# Patient Record
Sex: Female | Born: 1940 | Race: Black or African American | Hispanic: No | Smoking: Never smoker
Health system: Southern US, Community
[De-identification: ages and names within clinical notes are randomized; demographics above are authoritative.]

## PROBLEM LIST (undated history)

## (undated) DIAGNOSIS — M1990 Unspecified osteoarthritis, unspecified site: Secondary | ICD-10-CM

## (undated) DIAGNOSIS — G5600 Carpal tunnel syndrome, unspecified upper limb: Secondary | ICD-10-CM

## (undated) DIAGNOSIS — M069 Rheumatoid arthritis, unspecified: Secondary | ICD-10-CM

## (undated) DIAGNOSIS — K219 Gastro-esophageal reflux disease without esophagitis: Secondary | ICD-10-CM

## (undated) DIAGNOSIS — M62830 Muscle spasm of back: Secondary | ICD-10-CM

## (undated) DIAGNOSIS — I10 Essential (primary) hypertension: Secondary | ICD-10-CM

## (undated) HISTORY — DX: Muscle spasm of back: M62.830

## (undated) HISTORY — DX: Gastro-esophageal reflux disease without esophagitis: K21.9

## (undated) HISTORY — PX: TUBAL LIGATION: SHX77

## (undated) HISTORY — DX: Rheumatoid arthritis, unspecified: M06.9

## (undated) HISTORY — PX: KNEE SURGERY: SHX244

## (undated) HISTORY — DX: Essential (primary) hypertension: I10

---

## 1998-12-18 ENCOUNTER — Emergency Department (HOSPITAL_COMMUNITY): Admission: EM | Admit: 1998-12-18 | Discharge: 1998-12-18 | Payer: Self-pay | Admitting: Emergency Medicine

## 2001-06-25 ENCOUNTER — Encounter (HOSPITAL_COMMUNITY): Admission: RE | Admit: 2001-06-25 | Discharge: 2001-07-25 | Payer: Self-pay | Admitting: Orthopedic Surgery

## 2002-07-17 ENCOUNTER — Encounter: Payer: Self-pay | Admitting: Internal Medicine

## 2002-07-17 ENCOUNTER — Emergency Department (HOSPITAL_COMMUNITY): Admission: EM | Admit: 2002-07-17 | Discharge: 2002-07-17 | Payer: Self-pay | Admitting: Internal Medicine

## 2002-09-01 ENCOUNTER — Ambulatory Visit (HOSPITAL_COMMUNITY): Admission: RE | Admit: 2002-09-01 | Discharge: 2002-09-01 | Payer: Self-pay | Admitting: Ophthalmology

## 2003-11-10 ENCOUNTER — Ambulatory Visit (HOSPITAL_COMMUNITY): Admission: RE | Admit: 2003-11-10 | Discharge: 2003-11-10 | Payer: Self-pay | Admitting: Family Medicine

## 2004-01-14 ENCOUNTER — Emergency Department (HOSPITAL_COMMUNITY): Admission: EM | Admit: 2004-01-14 | Discharge: 2004-01-14 | Payer: Self-pay | Admitting: Emergency Medicine

## 2005-03-19 ENCOUNTER — Ambulatory Visit (HOSPITAL_COMMUNITY): Admission: RE | Admit: 2005-03-19 | Discharge: 2005-03-19 | Payer: Self-pay | Admitting: Family Medicine

## 2006-03-31 ENCOUNTER — Emergency Department (HOSPITAL_COMMUNITY): Admission: EM | Admit: 2006-03-31 | Discharge: 2006-03-31 | Payer: Self-pay | Admitting: Emergency Medicine

## 2006-09-20 ENCOUNTER — Ambulatory Visit (HOSPITAL_COMMUNITY): Admission: RE | Admit: 2006-09-20 | Discharge: 2006-09-20 | Payer: Self-pay | Admitting: Family Medicine

## 2006-11-01 ENCOUNTER — Emergency Department (HOSPITAL_COMMUNITY): Admission: EM | Admit: 2006-11-01 | Discharge: 2006-11-01 | Payer: Self-pay | Admitting: Emergency Medicine

## 2006-11-04 ENCOUNTER — Ambulatory Visit (HOSPITAL_COMMUNITY): Admission: RE | Admit: 2006-11-04 | Discharge: 2006-11-04 | Payer: Self-pay | Admitting: Emergency Medicine

## 2006-12-11 ENCOUNTER — Ambulatory Visit: Payer: Self-pay | Admitting: Gastroenterology

## 2006-12-11 ENCOUNTER — Ambulatory Visit (HOSPITAL_COMMUNITY): Admission: RE | Admit: 2006-12-11 | Discharge: 2006-12-11 | Payer: Self-pay | Admitting: Gastroenterology

## 2007-09-09 ENCOUNTER — Ambulatory Visit (HOSPITAL_COMMUNITY): Admission: RE | Admit: 2007-09-09 | Discharge: 2007-09-09 | Payer: Self-pay | Admitting: Family Medicine

## 2007-09-11 ENCOUNTER — Emergency Department (HOSPITAL_COMMUNITY): Admission: EM | Admit: 2007-09-11 | Discharge: 2007-09-11 | Payer: Self-pay | Admitting: Emergency Medicine

## 2009-01-21 ENCOUNTER — Ambulatory Visit (HOSPITAL_COMMUNITY): Admission: RE | Admit: 2009-01-21 | Discharge: 2009-01-21 | Payer: Self-pay | Admitting: Family Medicine

## 2009-06-06 ENCOUNTER — Encounter: Payer: Self-pay | Admitting: Orthopedic Surgery

## 2009-06-06 ENCOUNTER — Ambulatory Visit (HOSPITAL_COMMUNITY): Admission: RE | Admit: 2009-06-06 | Discharge: 2009-06-06 | Payer: Self-pay | Admitting: Family Medicine

## 2010-01-09 ENCOUNTER — Ambulatory Visit: Payer: Self-pay | Admitting: Orthopedic Surgery

## 2010-01-09 DIAGNOSIS — M545 Low back pain, unspecified: Secondary | ICD-10-CM | POA: Insufficient documentation

## 2010-01-09 DIAGNOSIS — Q762 Congenital spondylolisthesis: Secondary | ICD-10-CM

## 2010-01-09 DIAGNOSIS — G8929 Other chronic pain: Secondary | ICD-10-CM | POA: Insufficient documentation

## 2010-01-09 DIAGNOSIS — M549 Dorsalgia, unspecified: Secondary | ICD-10-CM | POA: Insufficient documentation

## 2010-01-11 ENCOUNTER — Encounter (HOSPITAL_COMMUNITY): Admission: RE | Admit: 2010-01-11 | Discharge: 2010-02-10 | Payer: Self-pay | Admitting: Orthopedic Surgery

## 2010-01-11 ENCOUNTER — Encounter: Payer: Self-pay | Admitting: Orthopedic Surgery

## 2010-01-13 ENCOUNTER — Telehealth: Payer: Self-pay | Admitting: Orthopedic Surgery

## 2010-05-03 ENCOUNTER — Encounter: Payer: Self-pay | Admitting: Orthopedic Surgery

## 2010-08-07 ENCOUNTER — Ambulatory Visit (HOSPITAL_COMMUNITY): Admission: RE | Admit: 2010-08-07 | Discharge: 2010-08-07 | Payer: Self-pay | Admitting: Family Medicine

## 2011-01-21 ENCOUNTER — Encounter: Payer: Self-pay | Admitting: Family Medicine

## 2011-01-30 NOTE — Miscellaneous (Signed)
Summary: Discharge from Rehab  Discharge from Rehab   Imported By: Jacklynn Ganong 05/08/2010 09:57:53  _____________________________________________________________________  External Attachment:    Type:   Image     Comment:   External Document

## 2011-01-30 NOTE — Progress Notes (Signed)
Summary: call from patient request for medication and for notes to PCP  Phone Note Call from Patient   Caller: Patient Summary of Call: Patient called, had been seen 01/09/10 in office;  ask if any med can be prescribed for back other than Ibuprofen.  I advised Dr Romeo Apple does not prescribe for dx chronic back pain.  Ased if we can fwd notes to her primary care doctor; done. Initial call taken by: Cammie Sickle,  January 13, 2010 12:18 PM

## 2011-01-30 NOTE — Letter (Signed)
Summary: History form  History form   Imported By: Jacklynn Ganong 01/16/2010 11:43:05  _____________________________________________________________________  External Attachment:    Type:   Image     Comment:   External Document

## 2011-01-30 NOTE — Assessment & Plan Note (Signed)
Summary: Back pain/needs xray/medicaid,medicare,evercare/frs   Vital Signs:  [name] profile:   70 year old female Pulse rate:   78 / minute Resp:     16 per minute  Vitals Entered By: Fuller Canada MD (January 09, 2010 9:55 AM)  Visit Type:  new   CC:  back pain .  History of Present Illness: I saw Kim [name] in the office today for an initial visit.  She is a 70 years old woman with the complaint of:  chief complaint:  Back pain. This is a 69 year old female presents with pain in her back for several months. He complains of moderate pain in the lower back, which is worse when she gets up in the morning improved somewhat with aspirin. She describes it as achy.   She denies any stiffness, catching, locking, leg, weakness or numbness.   she denies any previous treatment, including any injections or physical therapy.  No problems with urination, has to take something to make her bowels move.  -xrays done & where:  [facility] 06/06/09 L [facility].  Meds: aspirin.  Allergies (verified): No Known Drug Allergies  Past History:  Past Medical History: CTS bilateral hands arthritis  Past Surgical History: foot rt no bunion or fracture   hand left cts   knee right arthroscopy   eye left for cataracts  tubes tied  Family History: FH of Cancer:  Family History of Diabetes Family History of Arthritis  Social History: [name] is divorced.  unemployed no smoking no alcohol no caffeine.  Review of Systems General:  Complains of weight gain, fever, and chills; denies weight loss and fatigue. Cardiac :  Denies chest pain, angina, heart attack, heart failure, poor circulation, blood clots, and phlebitis. Resp:  Complains of short of breath and cough; denies difficulty breathing, COPD, and pneumonia. GI:  Complains of diarrhea; denies nausea, vomiting, constipation, difficulty swallowing, ulcers, GERD, and reflux. GU:  Denies kidney failure, kidney transplant, kidney stones,  burning, poor stream, testicular cancer, blood in urine, and . Neuro:  Complains of headache and dizziness; denies migraines, numbness, weakness, tremor, and unsteady walking. MS:  Complains of gout; denies joint pain, rheumatoid arthritis, joint swelling, bone cancer, osteoporosis, and . Endo:  Denies thyroid disease, goiter, and diabetes. Psych:  Denies depression, mood swings, anxiety, panic attack, bipolar, and schizophrenia. Derm:  Denies eczema, cancer, and itching. EENT:  Complains of cataracts, poor hearing, ears ringing, and toothaches; denies poor vision, glaucoma, vertigo, sinusitis, hoarseness, and bleeding gums. Immunology:  Denies seasonal allergies, sinus problems, and allergic to bee stings. Lymphatic:  Denies lymph node cancer and lymph edema.  Physical Exam  Msk:  The [name] is well developed and nourished, with normal grooming and hygiene. 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 Gluteal regions are nontender. Straight leg raise is negative.  Bilateral hip examination no pain with flexion of the hip 120. Knee flexion, 115 bilateral. Ankle motion is limited on the RIGHT and approximately 25 on the LEFT.  Muscle tone is normal.  Joint stability is normal.  Alignment valgus knees, RIGHT worse and LEFT.  Thumb deformities are noted.  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  She has back pain with spondylolisthesis and no radicular symptoms, recommended physical therapy. Follow up as needed.  Orders: New [name] Level III (41324)  Other Orders: Physical Therapy Referral (PT)  [name] Instructions: 1)  Most patients (90%) of patients with low back pain will improve with time (2-6 weeks). Limit activity to comfort and avoid activities that increase discomfort.  Apply moist heat and/or ice to lower back and take medication as instructed for pain relief. Please read the Back Pain Handout and start Physical Therapy as directed.  2)  Physical therapy  3)  f/u as needed

## 2011-01-30 NOTE — Miscellaneous (Signed)
Summary: PT cl;inical evaluation  PT cl;inical evaluation   Imported By: Jacklynn Ganong 01/30/2010 11:19:17  _____________________________________________________________________  External Attachment:    Type:   Image     Comment:   External Document

## 2011-05-18 NOTE — Op Note (Signed)
NAMETEIGHLOR, KORSON              ACCOUNT NO.:  [id number]   MEDICAL RECORD NO.:  [id number]          PATIENT TYPE:  AMB   LOCATION:  DAY                           FACILITY:  [facility]   PHYSICIAN:  Kassie Mends, M.D.      DATE OF BIRTH:  06-14-1941   DATE OF PROCEDURE:  12/11/2006  DATE OF DISCHARGE:                               OPERATIVE REPORT   PROCEDURE:  Colonoscopy.   ENDOSCOPIST:  Kassie Mends, M.D.   INDICATIONS FOR PROCEDURE:  Ms. Winfree is a 70 year old female who  presents for average risk colon cancer screening.   FINDINGS:  1. Normal colon, without evidence of polyps, masses, diverticula,      inflammatory changes, or vascular ectasias.  2. Normal retroflexed view of the rectum.   RECOMMENDATIONS:  1. Resume previous diet.  2. Screening colonoscopy in 10 years.  3. Follow up with Dr. Ishmael Holter. [place].   MEDICATIONS:  1. Demerol 75 mg IV.  2. Versed 3 mg IV.   DESCRIPTION OF PROCEDURE:  Informed consent was obtained from the  patient and a physical exam was performed.  The benefits, risks and  alternatives were explained to the patient.  The patient was connected  to the monitor and placed in the left lateral position.  Continuous  oxygen was provided by nasal cannula and IV medicine administered  through an indwelling cannula.  After administration of sedation and  rectal exam, the patient's rectum  was intubated and the scope was advanced under direct visualization to  the cecum.  The scope was subsequently removed slowly, by carefully  examining the texture, color, anatomy and integrity of the mucosa on the  way out.  The patient was recovered in the endoscopy suite and  discharged home in satisfactory condition.      Kassie Mends, M.D.  Electronically Signed     SM/MEDQ  D:  12/11/2006  T:  12/11/2006  Job:  [id number]   cc:   Angus G. Renard Matter, MD  Fax: [phone]

## 2011-10-12 LAB — B-NATRIURETIC PEPTIDE (CONVERTED LAB): Pro B Natriuretic peptide (BNP): <30

## 2011-10-12 LAB — DIFFERENTIAL
Basophils Absolute: 0
Basophils Relative: 1
Eosinophils Absolute: 0.2
Eosinophils Relative: 3
Lymphocytes Relative: 28
Lymphs Abs: 1.9
Monocytes Absolute: 0.4
Monocytes Relative: 5
Neutro Abs: 4.2
Neutrophils Relative %: 63

## 2011-10-12 LAB — COMPREHENSIVE METABOLIC PANEL
ALT: 12
AST: 25
Albumin: 3.3 — ABNORMAL LOW
Alkaline Phosphatase: 89
BUN: 9
CO2: 25
Calcium: 8.8
Chloride: 109
Creatinine, Ser: 0.72
GFR calc Af Amer: >60
GFR calc non Af Amer: >60
Glucose, Bld: 134 — ABNORMAL HIGH
Potassium: 3.2 — ABNORMAL LOW
Sodium: 140
Total Bilirubin: 0.5
Total Protein: 7.4

## 2011-10-12 LAB — POCT CARDIAC MARKERS
CKMB, poc: 5
CKMB, poc: 6.6
Myoglobin, poc: 102
Myoglobin, poc: 87.7
Operator id: 209231
Operator id: 282261
Troponin i, poc: <0.05
Troponin i, poc: <0.05

## 2011-10-12 LAB — LIPASE, BLOOD: Lipase: 21

## 2011-10-12 LAB — CBC
HCT: 41.8
Hemoglobin: 13.8
MCHC: 32.9
MCV: 86.6
Platelets: 263
RBC: 4.83
RDW: 13.9
WBC: 6.8

## 2012-02-29 ENCOUNTER — Emergency Department (HOSPITAL_COMMUNITY)
Admission: EM | Admit: 2012-02-29 | Discharge: 2012-02-29 | Disposition: A | Discharge status: Home / Self Care | Payer: PRIVATE HEALTH INSURANCE | Attending: Emergency Medicine | Admitting: Emergency Medicine

## 2012-02-29 ENCOUNTER — Encounter (HOSPITAL_COMMUNITY): Payer: Self-pay | Admitting: *Deleted

## 2012-02-29 DIAGNOSIS — R630 Anorexia: Secondary | ICD-10-CM | POA: Insufficient documentation

## 2012-02-29 DIAGNOSIS — M129 Arthropathy, unspecified: Secondary | ICD-10-CM | POA: Insufficient documentation

## 2012-02-29 DIAGNOSIS — R1110 Vomiting, unspecified: Secondary | ICD-10-CM | POA: Insufficient documentation

## 2012-02-29 DIAGNOSIS — R197 Diarrhea, unspecified: Secondary | ICD-10-CM | POA: Insufficient documentation

## 2012-02-29 DIAGNOSIS — R109 Unspecified abdominal pain: Secondary | ICD-10-CM | POA: Insufficient documentation

## 2012-02-29 HISTORY — DX: Unspecified osteoarthritis, unspecified site: M19.90

## 2012-02-29 LAB — DIFFERENTIAL
Basophils Absolute: 0 10*3/uL (ref 0.0–0.1)
Basophils Relative: 0 % (ref 0–1)
Eosinophils Absolute: 0.1 10*3/uL (ref 0.0–0.7)
Eosinophils Relative: 2 % (ref 0–5)
Lymphocytes Relative: 16 % (ref 12–46)
Lymphs Abs: 1.3 10*3/uL (ref 0.7–4.0)
Monocytes Absolute: 0.5 10*3/uL (ref 0.1–1.0)
Monocytes Relative: 6 % (ref 3–12)
Neutro Abs: 6.2 10*3/uL (ref 1.7–7.7)
Neutrophils Relative %: 76 % (ref 43–77)

## 2012-02-29 LAB — URINALYSIS, ROUTINE W REFLEX MICROSCOPIC
Bilirubin Urine: NEGATIVE
Glucose, UA: NEGATIVE mg/dL
Ketones, ur: NEGATIVE mg/dL
Nitrite: NEGATIVE
Specific Gravity, Urine: 1.025 (ref 1.005–1.030)
pH: 6 (ref 5.0–8.0)

## 2012-02-29 LAB — COMPREHENSIVE METABOLIC PANEL
ALT: 7 U/L (ref 0–35)
AST: 18 U/L (ref 0–37)
Albumin: 3.3 g/dL — ABNORMAL LOW (ref 3.5–5.2)
Alkaline Phosphatase: 95 U/L (ref 39–117)
BUN: 18 mg/dL (ref 6–23)
CO2: 26 mEq/L (ref 19–32)
Calcium: 8.6 mg/dL (ref 8.4–10.5)
Chloride: 106 mEq/L (ref 96–112)
Creatinine, Ser: 0.63 mg/dL (ref 0.50–1.10)
GFR calc Af Amer: >90 mL/min (ref >90)
GFR calc non Af Amer: 89 mL/min — ABNORMAL LOW (ref >90)
Glucose, Bld: 98 mg/dL (ref 70–99)
Potassium: 3.5 mEq/L (ref 3.5–5.1)
Sodium: 140 mEq/L (ref 135–145)
Total Bilirubin: 0.4 mg/dL (ref 0.3–1.2)
Total Protein: 7.8 g/dL (ref 6.0–8.3)

## 2012-02-29 LAB — CBC
HCT: 46.3 % — ABNORMAL HIGH (ref 36.0–46.0)
Hemoglobin: 15.1 g/dL — ABNORMAL HIGH (ref 12.0–15.0)
MCH: 29 pg (ref 26.0–34.0)
MCHC: 32.6 g/dL (ref 30.0–36.0)
MCV: 89 fL (ref 78.0–100.0)
Platelets: 258 10*3/uL (ref 150–400)
RBC: 5.2 MIL/uL — ABNORMAL HIGH (ref 3.87–5.11)
RDW: 13.5 % (ref 11.5–15.5)
WBC: 8.2 10*3/uL (ref 4.0–10.5)

## 2012-02-29 LAB — URINE MICROSCOPIC-ADD ON

## 2012-02-29 MED ORDER — ONDANSETRON 8 MG PO TBDP: 8.0000 mg | ORAL_TABLET | Freq: Three times a day (TID) | ORAL | Status: AC | PRN

## 2012-02-29 MED ORDER — ONDANSETRON HCL 4 MG/2ML IJ SOLN
4.0000 mg | Freq: Once | INTRAMUSCULAR | Status: AC
  Administered 2012-02-29: 4 mg via INTRAVENOUS
  Filled 2012-02-29: qty 2

## 2012-02-29 MED ORDER — LOPERAMIDE HCL 2 MG PO CAPS: 2.0000 mg | ORAL_CAPSULE | Freq: Four times a day (QID) | ORAL | Status: AC | PRN

## 2012-02-29 MED ORDER — SODIUM CHLORIDE 0.9 % IV BOLUS (SEPSIS)
500.0000 mL | Freq: Once | INTRAVENOUS | Status: AC
  Administered 2012-02-29: 12:00:00 via INTRAVENOUS

## 2012-02-29 MED ORDER — SODIUM CHLORIDE 0.9 % IV SOLN: INTRAVENOUS | Status: DC

## 2012-02-29 MED ORDER — CEFUROXIME AXETIL 250 MG PO TABS: 250.0000 mg | ORAL_TABLET | Freq: Two times a day (BID) | ORAL | Status: AC

## 2012-02-29 NOTE — ED Notes (Signed)
Abd pain, NVD since last night,  Other family members having similar sx.

## 2012-02-29 NOTE — ED Notes (Signed)
Pt began having ab pain around 3am today, +nausea, no vomiting or diarrhea, no fever. Normal po intake prior.

## 2012-02-29 NOTE — ED Provider Notes (Signed)
History   This chart was scribed for Celene Kras, MD by Sofie Rower. The patient was seen in room APA14/APA14 and the patient's care was started at 11:39AM.    CSN: [id number]  Arrival date & time 02/29/12  1104   First MD Initiated Contact with Patient 02/29/12 1134      Chief Complaint  Patient presents with  . Abdominal Pain    (Consider location/radiation/quality/duration/timing/severity/associated sxs/prior treatment) HPI  Kim [name] is a 71 y.o. female who presents to the Emergency Department complaining of moderate, constant abdominal pain onset today with associated symptoms of vomiting (2X), diarrhea (more than 10X). Pt states "she has been able to keep food down this morning. The last vomiting incident occurred last night. Modifying factors include taking "anti-diarrheal pills" with no relief. Pt has had sick contacts.  Pt denies any restaurant eating (eating out), fever, cough.  PCP is Dr. Renard Matter.   Past Medical History  Diagnosis Date  . Arthritis     Past Surgical History  Procedure Date  . Tubal ligation   . Knee surgery     Family History  Problem Relation Age of Onset  . Heart failure Mother   . Stroke Mother   . Heart failure Father   . Stroke Father     History  Substance Use Topics  . Smoking status: Never Smoker   . Smokeless tobacco: Not on file  . Alcohol Use: No    OB History    Grav Para Term Preterm Abortions TAB SAB Ect Mult Living                  Review of Systems  All other systems reviewed and are negative.    10 Systems reviewed and are negative for acute change except as noted in the HPI.   Allergies  Review of patient's allergies indicates no known allergies.  Home Medications  No current outpatient prescriptions on file.  BP 156/96  Pulse 104  Temp(Src) 99.2 F (37.3 C) (Oral)  Resp 16  Ht 5\' 2"  (1.575 m)  Wt 197 lb (89.359 kg)  BMI 36.03 kg/m2  SpO2 100%  Physical Exam  Nursing note and vitals  reviewed. Constitutional: She appears well-developed and well-nourished. No distress.  HENT:  Head: Normocephalic and atraumatic.  Right Ear: External ear normal.  Left Ear: External ear normal.       Mucus membranes dry.  Eyes: Conjunctivae are normal. Right eye exhibits no discharge. Left eye exhibits no discharge. No scleral icterus.  Neck: Neck supple. No tracheal deviation present.  Cardiovascular: Normal rate, regular rhythm and intact distal pulses.   Pulmonary/Chest: Effort normal and breath sounds normal. No stridor. No respiratory distress. She has no wheezes. She has no rales.  Abdominal: Soft. Bowel sounds are normal. She exhibits no distension and no mass. There is no tenderness. There is no rebound and no guarding.  Musculoskeletal: She exhibits no edema and no tenderness.  Neurological: She is alert. She has normal strength. No sensory deficit. Cranial nerve deficit:  no gross defecits noted. She exhibits normal muscle tone. She displays no seizure activity. Coordination normal.  Skin: Skin is warm and dry. No rash noted.  Psychiatric: She has a normal mood and affect.    ED Course  Procedures (including critical care time)  DIAGNOSTIC STUDIES: Oxygen Saturation is 100% on room air, normal by my interpretation.    COORDINATION OF CARE:   Results for orders placed during the hospital encounter of  02/29/12  CBC      Component Value Range   WBC 8.2  4.0 - 10.5 (K/uL)   RBC 5.20 (*) 3.87 - 5.11 (MIL/uL)   Hemoglobin 15.1 (*) 12.0 - 15.0 (g/dL)   HCT 16.1 (*) 09.6 - 46.0 (%)   MCV 89.0  78.0 - 100.0 (fL)   MCH 29.0  26.0 - 34.0 (pg)   MCHC 32.6  30.0 - 36.0 (g/dL)   RDW 04.5  40.9 - 81.1 (%)   Platelets 258  150 - 400 (K/uL)  DIFFERENTIAL      Component Value Range   Neutrophils Relative 76  43 - 77 (%)   Neutro Abs 6.2  1.7 - 7.7 (K/uL)   Lymphocytes Relative 16  12 - 46 (%)   Lymphs Abs 1.3  0.7 - 4.0 (K/uL)   Monocytes Relative 6  3 - 12 (%)   Monocytes  Absolute 0.5  0.1 - 1.0 (K/uL)   Eosinophils Relative 2  0 - 5 (%)   Eosinophils Absolute 0.1  0.0 - 0.7 (K/uL)   Basophils Relative 0  0 - 1 (%)   Basophils Absolute 0.0  0.0 - 0.1 (K/uL)  COMPREHENSIVE METABOLIC PANEL      Component Value Range   Sodium 140  135 - 145 (mEq/L)   Potassium 3.5  3.5 - 5.1 (mEq/L)   Chloride 106  96 - 112 (mEq/L)   CO2 26  19 - 32 (mEq/L)   Glucose, Bld 98  70 - 99 (mg/dL)   BUN 18  6 - 23 (mg/dL)   Creatinine, Ser 9.14  0.50 - 1.10 (mg/dL)   Calcium 8.6  8.4 - 78.2 (mg/dL)   Total Protein 7.8  6.0 - 8.3 (g/dL)   Albumin 3.3 (*) 3.5 - 5.2 (g/dL)   AST 18  0 - 37 (U/L)   ALT 7  0 - 35 (U/L)   Alkaline Phosphatase 95  39 - 117 (U/L)   Total Bilirubin 0.4  0.3 - 1.2 (mg/dL)   GFR calc non Af Amer 89 (*) >90 (mL/min)   GFR calc Af Amer >90  >90 (mL/min)  URINALYSIS, ROUTINE W REFLEX MICROSCOPIC      Component Value Range   Color, Urine YELLOW  YELLOW    APPearance CLEAR  CLEAR    Specific Gravity, Urine 1.025  1.005 - 1.030    pH 6.0  5.0 - 8.0    Glucose, UA NEGATIVE  NEGATIVE (mg/dL)   Hgb urine dipstick TRACE (*) NEGATIVE    Bilirubin Urine NEGATIVE  NEGATIVE    Ketones, ur NEGATIVE  NEGATIVE (mg/dL)   Protein, ur NEGATIVE  NEGATIVE (mg/dL)   Urobilinogen, UA 0.2  0.0 - 1.0 (mg/dL)   Nitrite NEGATIVE  NEGATIVE    Leukocytes, UA LARGE (*) NEGATIVE   URINE MICROSCOPIC-ADD ON      Component Value Range   WBC, UA TOO NUMEROUS TO COUNT  <3 (WBC/hpf)   RBC / HPF 3-6  <3 (RBC/hpf)   Bacteria, UA MANY (*) RARE       11:45AM- EDP at bedside discusses treatment plan. 12:49PM- Recheck. EDP at bedside discusses treatment plan.   MDM  I suspect the patient's having a viral gastroenteritis type illness. Pt feeling better.  Abd exam benign       I personally performed the services described in this documentation, which was scribed in my presence.  The recorded information has been reviewed and considered.   Celene Kras, MD 02/29/12  1620 

## 2012-02-29 NOTE — Discharge Instructions (Signed)
Diarrhea Infections caused by germs (bacterial) or a virus commonly cause diarrhea. Your caregiver has determined that with time, rest and fluids, the diarrhea should improve. In general, eat normally while drinking more water than usual. Although water may prevent dehydration, it does not contain salt and minerals (electrolytes). Broths, weak tea without caffeine and oral rehydration solutions (ORS) replace fluids and electrolytes. Small amounts of fluids should be taken frequently. Large amounts at one time may not be tolerated. Plain water may be harmful in infants and the elderly. Oral rehydrating solutions (ORS) are available at pharmacies and grocery stores. ORS replace water and important electrolytes in proper proportions. Sports drinks are not as effective as ORS and may be harmful due to sugars worsening diarrhea.  ORS is especially recommended for use in children with diarrhea. As a general guideline for children, replace any new fluid losses from diarrhea and/or vomiting with ORS as follows:   If your child weighs 22 pounds or under (10 kg or less), give 60-120 mL ( -  cup or 2 - 4 ounces) of ORS for each episode of diarrheal stool or vomiting episode.   If your child weighs more than 22 pounds (more than 10 kgs), give 120-240 mL ( - 1 cup or 4 - 8 ounces) of ORS for each diarrheal stool or episode of vomiting.   While correcting for dehydration, children should eat normally. However, foods high in sugar should be avoided because this may worsen diarrhea. Large amounts of carbonated soft drinks, juice, gelatin desserts and other highly sugared drinks should be avoided.   After correction of dehydration, other liquids that are appealing to the child may be added. Children should drink small amounts of fluids frequently and fluids should be increased as tolerated. Children should drink enough fluids to keep urine clear or pale yellow.   Adults should eat normally while drinking more fluids  than usual. Drink small amounts of fluids frequently and increase as tolerated. Drink enough fluids to keep urine clear or pale yellow. Broths, weak decaffeinated tea, lemon lime soft drinks (allowed to go flat) and ORS replace fluids and electrolytes.   Avoid:   Carbonated drinks.   Juice.   Extremely hot or cold fluids.   Caffeine drinks.   Fatty, greasy foods.   Alcohol.   Tobacco.   Too much intake of anything at one time.   Gelatin desserts.   Probiotics are active cultures of beneficial bacteria. They may lessen the amount and number of diarrheal stools in adults. Probiotics can be found in yogurt with active cultures and in supplements.   Wash hands well to avoid spreading bacteria and virus.   Anti-diarrheal medications are not recommended for infants and children.   Only take over-the-counter or prescription medicines for pain, discomfort or fever as directed by your caregiver. Do not give aspirin to children because it may cause Reye's Syndrome.   For adults, ask your caregiver if you should continue all prescribed and over-the-counter medicines.   If your caregiver has given you a follow-up appointment, it is very important to keep that appointment. Not keeping the appointment could result in a chronic or permanent injury, and disability. If there is any problem keeping the appointment, you must call back to this facility for assistance.  SEEK IMMEDIATE MEDICAL CARE IF:   You or your child is unable to keep fluids down or other symptoms or problems become worse in spite of treatment.   Vomiting or diarrhea develops and becomes persistent.     There is vomiting of blood or bile (green material).   There is blood in the stool or the stools are black and tarry.   There is no urine output in 6-8 hours or there is only a small amount of very dark urine.   Abdominal pain develops, increases or localizes.   You have a fever.   Your baby is older than 3 months with a  rectal temperature of 102 F (38.9 C) or higher.   Your baby is 3 months old or younger with a rectal temperature of 100.4 F (38 C) or higher.   You or your child develops excessive weakness, dizziness, fainting or extreme thirst.   You or your child develops a rash, stiff neck, severe headache or become irritable or sleepy and difficult to awaken.  MAKE SURE YOU:   Understand these instructions.   Will watch your condition.   Will get help right away if you are not doing well or get worse.  Document Released: 12/07/2002 Document Revised: 08/29/2011 Document Reviewed: 10/24/2009 [employer] Patient Information 2012 [employer]. 

## 2013-08-06 ENCOUNTER — Ambulatory Visit (INDEPENDENT_AMBULATORY_CARE_PROVIDER_SITE_OTHER): Payer: PRIVATE HEALTH INSURANCE | Admitting: Obstetrics & Gynecology

## 2013-08-06 ENCOUNTER — Encounter: Payer: Self-pay | Admitting: Obstetrics & Gynecology

## 2013-08-06 VITALS — BP 180/100 | Ht 62.0 in | Wt 188.0 lb

## 2013-08-06 DIAGNOSIS — N812 Incomplete uterovaginal prolapse: Secondary | ICD-10-CM

## 2013-08-06 NOTE — Progress Notes (Signed)
Patient ID: [name], female   DOB: 28-May-1941, 72 y.o.   MRN: [id number]      Kim [name] presents today for routine follow up related to her pessary.   She uses a milex ring with support #5 She reports little vaginal discharge or vaginal bleeding.  Exam reveals no undue vaginal mucosal pressure of breakdown, no discharge and no vaginal bleeding.  The pessary is removed, cleaned and replaced without difficulty.    OMARIA PLUNK will be sen back in 3 months for continued follow up.  EURE,LUTHER H 08/06/2013 10:47 AM

## 2013-08-06 NOTE — Patient Instructions (Addendum)
Prolapse  Prolapse means the falling down, bulging, dropping, or drooping of a body part. Organs that commonly prolapse include the rectum, small intestine, bladder, urethra, vagina (birth canal), uterus (womb), and cervix. Prolapse occurs when the ligaments and muscle tissue around the rectum, bladder, and uterus are damaged or weakened.  CAUSES  This happens especially with:  Childbirth. Some women feel pelvic pressure or have trouble holding their urine right after childbirth, because of stretching and tearing of pelvic tissues. This generally gets better with time and the feeling usually goes away, but it may return with aging.  Chronic heavy lifting.  Aging.  Menopause, with loss of estrogen production weakening the pelvic ligaments and muscles.  Past pelvic surgery.  Obesity.  Chronic constipation.  Chronic cough. Prolapse may affect a single organ, or several organs may prolapse at the same time. The front wall of the vagina holds up the bladder. The back wall holds up part of the lower intestine, or rectum. The uterus fills a spot in the middle. All these organs can be involved when the ligaments and muscles around the vagina relax too much. This often gets worse when women stop producing estrogen (menopause). SYMPTOMS  Uncontrolled loss of urine (incontinence) with cough, sneeze, straining, and exercise.  More force may be required to have a bowel movement, due to trapping of the stool.  When part of an organ bulges through the opening of the vagina, there is sometimes a feeling of heaviness or pressure. It may feel as though something is falling out. This sensation increases with coughing or bearing down.  If the organs protrude through the opening of the vagina and rub against the clothing, there may be soreness, ulcers, infection, pain, and bleeding.  Lower back pain.  Pushing in the upper or lower part of the vagina, to pass urine or have a bowel movement.  Problems  having sexual intercourse.  Being unable to insert a tampon or applicator. DIAGNOSIS  Usually, a physical exam is all that is needed to identify the problem. During the examination, you may be asked to cough and strain while lying down, sitting up, and standing up. Your caregiver will determine if more testing is required, such as bladder function tests. Some diagnoses are:  Cystocele: Bulging and falling of the bladder into the top of the vagina.  Rectocele: Part of the rectum bulging into the vagina.  Prolapse of the uterus: The uterus falls or drops into the vagina.  Enterocele: Bulging of the top of the vagina, after a hysterectomy (uterus removal), with the small intestine bulging into the vagina. A hernia in the top of the vagina.  Urethrocele: The urethra (urine carrying tube) bulging into the vagina. TREATMENT  In most cases, prolapse needs to be treated only if it produces symptoms. If the symptoms are interfering with your usual daily or sexual activities, treatment may be necessary. The following are some measures that may be used to treat prolapse.  Estrogen may help elderly women with mild prolapse.  Kegel exercises may help mild cases of prolapse, by strengthening and tightening the muscles of the pelvic floor.  Pessaries are used in women who choose not to, or are unable to, have surgery. A pessary is a doughnut-shaped piece of plastic or rubber that is put into the vagina to keep the organs in place. This device must be fitted by your caregiver. Your caregiver will also explain how to care for yourself with the pessary. If it works well for you,   this may be the only treatment required.  Surgery is often the only form of treatment for more severe prolapses. There are different types of surgery available. You should discuss what the best procedure is for you. If the uterus is prolapsed, it may be removed (hysterectomy) as part of the surgical treatment. Your caregiver will  discuss the risks and benefits with you.  Uterine-vaginal suspension (surgery to hold up the organs) may be used, especially if you want to maintain your fertility. No form of treatment is guaranteed to correct the prolapse or relieve the symptoms. HOME CARE INSTRUCTIONS   Wear a sanitary pad or absorbent product if you have incontinence of urine.  Avoid heavy lifting and straining with exercise and work.  Take over-the-counter pain medicine for minor discomfort.  Try taking estrogen or using estrogen vaginal cream.  Try Kegel exercises or use a pessary, before deciding to have surgery.  Do Kegel exercises after having a baby. SEEK MEDICAL CARE IF:   Your symptoms interfere with your daily activities.  You need medicine to help with the discomfort.  You need to be fitted with a pessary.  You notice bleeding from the vagina.  You think you have ulcers or you notice ulcers on the cervix.  You have an oral temperature above 102 F (38.9 C).  You develop pain or blood with urination.  You have bleeding with a bowel movement.  The symptoms are interfering with your sex life.  You have urinary incontinence that interferes with your daily activities.  You lose urine with sexual intercourse.  You have a chronic cough.  You have chronic constipation. Document Released: 06/23/2003 Document Revised: 03/10/2012 Document Reviewed: 01/01/2010 [employer] Patient Information 2014 [employer].  

## 2013-11-06 ENCOUNTER — Ambulatory Visit: Payer: PRIVATE HEALTH INSURANCE | Admitting: Obstetrics & Gynecology

## 2013-11-13 ENCOUNTER — Ambulatory Visit (INDEPENDENT_AMBULATORY_CARE_PROVIDER_SITE_OTHER): Payer: PRIVATE HEALTH INSURANCE | Admitting: Obstetrics & Gynecology

## 2013-11-13 ENCOUNTER — Encounter: Payer: Self-pay | Admitting: Obstetrics & Gynecology

## 2013-11-13 VITALS — BP 130/80 | Wt 188.0 lb

## 2013-11-13 DIAGNOSIS — N812 Incomplete uterovaginal prolapse: Secondary | ICD-10-CM

## 2013-11-13 NOTE — Progress Notes (Signed)
Patient ID: Brynna Dobos, female   DOB: [birth year], 72 y.o.   MRN: [id number] Patient ID: Chrishawn Kring, female   DOB: [birth year], 72 y.o.   MRN: [id number]      Caraline Deutschman presents today for routine follow up related to her pessary.   She uses a milex ring with support #5 She reports little vaginal discharge or vaginal bleeding.  Exam reveals no undue vaginal mucosal pressure of breakdown, no discharge and no vaginal bleeding.  The pessary is removed, cleaned and replaced without difficulty.    Nelia Rogoff will be sen back in 3 months for continued follow up.  Lavoris Canizales H 11/13/2013 12:01 PM

## 2014-02-15 ENCOUNTER — Encounter: Payer: Self-pay | Admitting: Obstetrics & Gynecology

## 2014-02-15 ENCOUNTER — Encounter (INDEPENDENT_AMBULATORY_CARE_PROVIDER_SITE_OTHER): Payer: Self-pay

## 2014-02-15 ENCOUNTER — Ambulatory Visit (INDEPENDENT_AMBULATORY_CARE_PROVIDER_SITE_OTHER): Payer: PRIVATE HEALTH INSURANCE | Admitting: Obstetrics & Gynecology

## 2014-02-15 VITALS — BP 150/100 | Wt 183.0 lb

## 2014-02-15 DIAGNOSIS — N812 Incomplete uterovaginal prolapse: Secondary | ICD-10-CM

## 2014-02-15 NOTE — Progress Notes (Signed)
Patient ID: Aloura Matsuoka, female   DOB: 03/19/1941, 73 y.o.   MRN: [id number] Patient ID: Kabao Leite, female   DOB: 20-Jan-1941, 73 y.o.   MRN: [id number] Patient ID: Shameria Trimarco, female   DOB: December 18, 1941, 73 y.o.   MRN: [id number]      Kim [name] presents today for routine follow up related to her pessary.   She uses a milex ring with support #5 She reports little vaginal discharge or vaginal bleeding.  Exam reveals no undue vaginal mucosal pressure of breakdown, no discharge and no vaginal bleeding.  The pessary is removed, cleaned and replaced without difficulty.    Kim [name] will be sen back in 3 months for continued follow up.  EURE,LUTHER H 02/15/2014 9:42 AM

## 2014-05-17 ENCOUNTER — Ambulatory Visit (INDEPENDENT_AMBULATORY_CARE_PROVIDER_SITE_OTHER): Payer: PRIVATE HEALTH INSURANCE | Admitting: Obstetrics & Gynecology

## 2014-05-17 ENCOUNTER — Encounter: Payer: Self-pay | Admitting: Obstetrics & Gynecology

## 2014-05-17 VITALS — BP 150/90 | Wt 192.0 lb

## 2014-05-17 DIAGNOSIS — N812 Incomplete uterovaginal prolapse: Secondary | ICD-10-CM

## 2014-05-17 NOTE — Progress Notes (Signed)
Patient ID: Kim [name], female   DOB: 1941-06-01, 73 y.o.     MRN: [id number] Kim [name] presents today for routine follow up related to her pessary.   She uses a milex ring with support #5 She reports little vaginal discharge or vaginal bleeding.  Exam reveals no undue vaginal mucosal pressure of breakdown, no discharge and no vaginal bleeding.  The pessary is removed, cleaned and replaced without difficulty.    Lexis Potenza will be sen back in 3 months for continued follow up.  Mertie Clause Eure 05/17/2014 10:14 AM

## 2014-08-13 ENCOUNTER — Ambulatory Visit: Payer: PRIVATE HEALTH INSURANCE

## 2014-08-17 ENCOUNTER — Encounter: Payer: Self-pay | Admitting: Obstetrics & Gynecology

## 2014-08-17 ENCOUNTER — Ambulatory Visit: Payer: PRIVATE HEALTH INSURANCE | Admitting: Obstetrics & Gynecology

## 2014-08-17 ENCOUNTER — Ambulatory Visit (INDEPENDENT_AMBULATORY_CARE_PROVIDER_SITE_OTHER): Payer: PRIVATE HEALTH INSURANCE | Admitting: Obstetrics & Gynecology

## 2014-08-17 VITALS — BP 148/80 | Wt 186.0 lb

## 2014-08-17 DIAGNOSIS — N812 Incomplete uterovaginal prolapse: Secondary | ICD-10-CM

## 2014-08-17 NOTE — Progress Notes (Signed)
Patient ID: Kim [name], female   DOB: Jun 23, 1941, 73 y.o.   MRN: [id number] Patient ID: Kim [name], female   DOB: 1941/12/02, 73 y.o.     MRN: [id number] Kim [name] presents today for routine follow up related to her pessary.   She uses a milex ring with support #5 She reports little vaginal discharge or vaginal bleeding.  Exam reveals no undue vaginal mucosal pressure of breakdown, no discharge and no vaginal bleeding.  The pessary is removed, cleaned and replaced without difficulty.    Kim [name] will be sen back in 3 months for continued follow up.  General Wearing H 08/17/2014 10:08 AM

## 2014-08-20 ENCOUNTER — Ambulatory Visit (INDEPENDENT_AMBULATORY_CARE_PROVIDER_SITE_OTHER): Payer: PRIVATE HEALTH INSURANCE

## 2014-08-20 ENCOUNTER — Telehealth: Payer: Self-pay | Admitting: *Deleted

## 2014-08-20 VITALS — BP 128/80 | HR 91 | Resp 15 | Ht 66.0 in | Wt 186.0 lb

## 2014-08-20 DIAGNOSIS — M79609 Pain in unspecified limb: Secondary | ICD-10-CM

## 2014-08-20 DIAGNOSIS — M778 Other enthesopathies, not elsewhere classified: Secondary | ICD-10-CM

## 2014-08-20 DIAGNOSIS — M79673 Pain in unspecified foot: Secondary | ICD-10-CM

## 2014-08-20 DIAGNOSIS — M19079 Primary osteoarthritis, unspecified ankle and foot: Secondary | ICD-10-CM

## 2014-08-20 DIAGNOSIS — B351 Tinea unguium: Secondary | ICD-10-CM

## 2014-08-20 DIAGNOSIS — M7750 Other enthesopathy of unspecified foot: Secondary | ICD-10-CM

## 2014-08-20 DIAGNOSIS — S90121S Contusion of right lesser toe(s) without damage to nail, sequela: Secondary | ICD-10-CM

## 2014-08-20 DIAGNOSIS — M779 Enthesopathy, unspecified: Secondary | ICD-10-CM

## 2014-08-20 NOTE — Patient Instructions (Signed)
Osteoarthritis Osteoarthritis is a disease that causes soreness and inflammation of a joint. It occurs when the cartilage at the affected joint wears down. Cartilage acts as a cushion, covering the ends of bones where they meet to form a joint. Osteoarthritis is the most common form of arthritis. It often occurs in older people. The joints affected most often by this condition include those in the:  Ends of the fingers.  Thumbs.  Neck.  Lower back.  Knees.  Hips. CAUSES  Over time, the cartilage that covers the ends of bones begins to wear away. This causes bone to rub on bone, producing pain and stiffness in the affected joints.  RISK FACTORS Certain factors can increase your chances of having osteoarthritis, including:  Older age.  Excessive body weight.  Overuse of joints.  Previous joint injury. SIGNS AND SYMPTOMS   Pain, swelling, and stiffness in the joint.  Over time, the joint may lose its normal shape.  Small deposits of bone (osteophytes) may grow on the edges of the joint.  Bits of bone or cartilage can break off and float inside the joint space. This may cause more pain and damage. DIAGNOSIS  Your health care provider will do a physical exam and ask about your symptoms. Various tests may be ordered, such as:  X-rays of the affected joint.  An MRI scan.  Blood tests to rule out other types of arthritis.  Joint fluid tests. This involves using a needle to draw fluid from the joint and examining the fluid under a microscope. TREATMENT  Goals of treatment are to control pain and improve joint function. Treatment plans may include:  A prescribed exercise program that allows for rest and joint relief.  A weight control plan.  Pain relief techniques, such as:  Properly applied heat and cold.  Electric pulses delivered to nerve endings under the skin (transcutaneous electrical nerve stimulation [TENS]).  Massage.  Certain nutritional  supplements.  Medicines to control pain, such as:  Acetaminophen.  Nonsteroidal anti-inflammatory drugs (NSAIDs), such as naproxen.  Narcotic or central-acting agents, such as tramadol.  Corticosteroids. These can be given orally or as an injection.  Surgery to reposition the bones and relieve pain (osteotomy) or to remove loose pieces of bone and cartilage. Joint replacement may be needed in advanced states of osteoarthritis. HOME CARE INSTRUCTIONS   Take medicines only as directed by your health care provider.  Maintain a healthy weight. Follow your health care provider's instructions for weight control. This may include dietary instructions.  Exercise as directed. Your health care provider can recommend specific types of exercise. These may include:  Strengthening exercises. These are done to strengthen the muscles that support joints affected by arthritis. They can be performed with weights or with exercise bands to add resistance.  Aerobic activities. These are exercises, such as brisk walking or low-impact aerobics, that get your heart pumping.  Range-of-motion activities. These keep your joints limber.  Balance and agility exercises. These help you maintain daily living skills.  Rest your affected joints as directed by your health care provider.  Keep all follow-up visits as directed by your health care provider. SEEK MEDICAL CARE IF:   Your skin turns red.  You develop a rash in addition to your joint pain.  You have worsening joint pain.  You have a fever along with joint or muscle aches. SEEK IMMEDIATE MEDICAL CARE IF:  You have a significant loss of weight or appetite.  You have night sweats. FOR MORE  [place] of Arthritis and Musculoskeletal and Skin Diseases: www.niams.SouthExposed.es  [employer] on Aging: http://kim-miller.com/  American College of Rheumatology: www.rheumatology.org Document Released: 12/17/2005 Document Revised:  05/03/2014 Document Reviewed: 08/24/2013 [facility] Patient Information 2015 [place], [place]. This information is not intended to replace advice given to you by your health care provider. Make sure you discuss any questions you have with your health care provider.      Recommendations for shoes at this time. Suggest going to [employer], unless [employer]. Get extra-depth or diabetic type shoes such as Apes, Drew, [employer]. Need to maintain a stiff soled shoe. Avoid going barefoot or wearing any flimsy unstable shoes.

## 2014-08-20 NOTE — Progress Notes (Signed)
Subjective:    Patient ID: Kim [name], female    DOB: [birth year], 73 y.o.   MRN: [id number]  HPI Comments: N foot pain L B/L midarch plantar D 1 month O C stabbing pain A worse at night T warm soaks and alcohol massage  Pt states her right 1st toenail turned dark at the bottom then began to come off.  Foot Pain      Review of Systems  HENT: Positive for hearing loss and tinnitus.   Cardiovascular: Positive for leg swelling.  Gastrointestinal: Positive for constipation.  All other systems reviewed and are negative.      Objective:   Physical Exam 73 year old [place] American female presents at this time for initial visit well-developed well-nourished oriented x3 presents at this time with a complaint of pain in her arches and ankle areas both feet also having problems the right great toenail which is cracked or fissure dark and discolored. Lower extremity objective findings as follows vascular status appears to be intact although diminished DP pulses +2/4 bilateral PT one over 4 bilateral Refill time 3 seconds epicritic and proprioceptive sensations intact and symmetric bilateral there is normal plantar response DTRs not listed neurologically skin color pigment normal hair growth absent nails criptotic discolored and darkened yellow brittle crumbly nails and fissuring secondary contusion to right hallux nail is noted. Orthopedic biomechanical exam rectus foot type however significant promontory changes noted bilateral there is some reduced ankle motion with crepitus noted however there is a complete lack of subtalar and mid tarsus motion inversion or eversion both feet with pronated foot collapse the medial arch and medial midfoot with abduction of the forefoot. Mild to moderate digital contractures are also noted. Radiographically there is significant collapse of the subtalar joint patient apparently had surgery with may have been triple arthrodesis indicates there was some  external fix on her foot at some point. 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 3 changes noted in the forefoot collapse of the medial column and medial arch noted on lateral projections again most the rear foot joints can be difficult to visualize or absence of visualization. Digital motions are relatively normal there is mild HAV deformity lateral deviation of the hallux as well as adductovarus rotation lesser digits with semirigid digital contractures noted contusion of hallux with fissuring mycotic hallux nail which is debrided at this time. Remaining nails also thick brittle crumbly friable dystrophic or debridement in the presence of onychomycosis pain in symptomology. Return suggest 3 month followup for palliative nail care is needed in the future. She is unable to cut her own nails due to thickness and dystrophy. As far as her foot she would benefit from stiff soled shoes currently wearing a pair of shoes are extremely hyperflexible in and not stable in all recommended and second extra-depth shoes such as an apex true or appropriate shoe which can be obtained at the shoe market. We did check are noted torn significant donator shoe turgor are do not have anything in her size at  today's visit. Recommended 3 month followup. In future patient may be candidate for shoe modification and orthotic with Plastizote insoles I can furnish for if she gets appropriate shoes now surgical candidate patient is has  significant arthropathy recommend plain Tylenol as needed for pain patient is RE taking other pain medications her primary physician.   Harriet Masson DPM

## 2014-08-20 NOTE — Telephone Encounter (Signed)
I was seen by Dr. Blenda Mounts today.  He said I need some shoes with special support in them.  Is there a special name for the shoes?  Leave me message if I'm not here on the answering machine please.

## 2014-08-25 NOTE — Telephone Encounter (Signed)
I called and informed her that New Balance is a good shoe.  She asked, Do they have have good arch support?  I told her yes they do.  She asked, Will I need to get extra arch supports to go in them?  I told her no.  She stated I will go out and get me some.

## 2014-08-30 ENCOUNTER — Other Ambulatory Visit (HOSPITAL_COMMUNITY): Payer: Self-pay | Admitting: Family Medicine

## 2014-08-30 DIAGNOSIS — Z1231 Encounter for screening mammogram for malignant neoplasm of breast: Secondary | ICD-10-CM

## 2014-09-03 ENCOUNTER — Ambulatory Visit (HOSPITAL_COMMUNITY): Payer: PRIVATE HEALTH INSURANCE

## 2014-09-08 ENCOUNTER — Ambulatory Visit (HOSPITAL_COMMUNITY)
Admission: RE | Admit: 2014-09-08 | Discharge: 2014-09-08 | Disposition: A | Discharge status: Home / Self Care | Payer: PRIVATE HEALTH INSURANCE | Source: Ambulatory Visit | Attending: Family Medicine | Admitting: Family Medicine

## 2014-09-08 DIAGNOSIS — Z1231 Encounter for screening mammogram for malignant neoplasm of breast: Secondary | ICD-10-CM | POA: Diagnosis present

## 2014-11-16 ENCOUNTER — Ambulatory Visit: Payer: PRIVATE HEALTH INSURANCE

## 2014-11-16 ENCOUNTER — Ambulatory Visit (INDEPENDENT_AMBULATORY_CARE_PROVIDER_SITE_OTHER): Payer: PRIVATE HEALTH INSURANCE | Admitting: Obstetrics & Gynecology

## 2014-11-16 ENCOUNTER — Encounter: Payer: Self-pay | Admitting: Obstetrics & Gynecology

## 2014-11-16 VITALS — BP 110/60 | Wt 191.0 lb

## 2014-11-16 DIAGNOSIS — N812 Incomplete uterovaginal prolapse: Secondary | ICD-10-CM

## 2014-11-16 NOTE — Progress Notes (Signed)
Patient ID: Kim [name], female   DOB: [birth year], 73 y.o.   MRN: [id number]      Kim [name] presents today for routine follow up related to her pessary.   She uses a Milex ring with support #5 She reports no vaginal discharge or vaginal bleeding.  Exam reveals no undue vaginal mucosal pressure of breakdown, no discharge and no vaginal bleeding.  The pessary is removed, cleaned and replaced without difficulty.    [name] will be sen back in 4 months for continued follow up.  EURE,LUTHER H 11/16/2014 10:15 AM

## 2014-11-23 ENCOUNTER — Ambulatory Visit: Payer: PRIVATE HEALTH INSURANCE

## 2014-12-28 ENCOUNTER — Telehealth: Payer: Self-pay | Admitting: *Deleted

## 2014-12-28 NOTE — Telephone Encounter (Signed)
Pt states she has a question concerning her appt with Dr. Blenda Mounts.  I called the pt and she stated she forgot her appt time, reviewed in EPIC and her appt as 01/04/2015 at 1000am and informed pt.

## 2015-01-04 ENCOUNTER — Ambulatory Visit (INDEPENDENT_AMBULATORY_CARE_PROVIDER_SITE_OTHER): Payer: Medicare Other

## 2015-01-04 VITALS — BP 152/86 | HR 72 | Resp 12

## 2015-01-04 DIAGNOSIS — M779 Enthesopathy, unspecified: Secondary | ICD-10-CM

## 2015-01-04 DIAGNOSIS — R269 Unspecified abnormalities of gait and mobility: Secondary | ICD-10-CM

## 2015-01-04 DIAGNOSIS — M19079 Primary osteoarthritis, unspecified ankle and foot: Secondary | ICD-10-CM

## 2015-01-04 DIAGNOSIS — M79673 Pain in unspecified foot: Secondary | ICD-10-CM

## 2015-01-04 DIAGNOSIS — M7750 Other enthesopathy of unspecified foot: Secondary | ICD-10-CM

## 2015-01-04 DIAGNOSIS — Q828 Other specified congenital malformations of skin: Secondary | ICD-10-CM

## 2015-01-04 DIAGNOSIS — M778 Other enthesopathies, not elsewhere classified: Secondary | ICD-10-CM

## 2015-01-04 NOTE — Progress Notes (Signed)
   Subjective:    Patient ID: Kim [name], female    DOB: 04-15-1941, 74 y.o.   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 Nails somewhat thickened and criptotic incurvated patient may be candidate for future palliative nail care however no signs of infection no ingrown nail findings consistent with onychomycosis may be present although the pain is more with porokeratosis of the IP joint.       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 At this time dispensed 1 pair of multi-density Plastizote type insoles which provided support and structure to the arch were placed her new balance shoes patient wearing a size 10 new balance shoes could even use a size 11 in the future. At this time the insoles provided immediate relief and support and stability to her gait the keratotic lesion first right is debrided return in the future 3-6 months for palliative care if needed or for shoe adjustments if needed  Harriet Masson DPM

## 2015-01-04 NOTE — Patient Instructions (Signed)

## 2015-01-13 ENCOUNTER — Emergency Department (HOSPITAL_COMMUNITY)
Admission: EM | Admit: 2015-01-13 | Discharge: 2015-01-13 | Disposition: A | Discharge status: Home / Self Care | Payer: Medicare Other | Attending: Emergency Medicine | Admitting: Emergency Medicine

## 2015-01-13 ENCOUNTER — Emergency Department (HOSPITAL_COMMUNITY): Payer: Medicare Other

## 2015-01-13 ENCOUNTER — Encounter (HOSPITAL_COMMUNITY): Payer: Self-pay | Admitting: Emergency Medicine

## 2015-01-13 DIAGNOSIS — R1013 Epigastric pain: Secondary | ICD-10-CM | POA: Insufficient documentation

## 2015-01-13 DIAGNOSIS — R059 Cough, unspecified: Secondary | ICD-10-CM

## 2015-01-13 DIAGNOSIS — R05 Cough: Secondary | ICD-10-CM | POA: Insufficient documentation

## 2015-01-13 DIAGNOSIS — R0981 Nasal congestion: Secondary | ICD-10-CM | POA: Insufficient documentation

## 2015-01-13 DIAGNOSIS — Z9851 Tubal ligation status: Secondary | ICD-10-CM | POA: Insufficient documentation

## 2015-01-13 DIAGNOSIS — I10 Essential (primary) hypertension: Secondary | ICD-10-CM | POA: Diagnosis not present

## 2015-01-13 DIAGNOSIS — M1990 Unspecified osteoarthritis, unspecified site: Secondary | ICD-10-CM | POA: Insufficient documentation

## 2015-01-13 DIAGNOSIS — R1110 Vomiting, unspecified: Secondary | ICD-10-CM | POA: Diagnosis not present

## 2015-01-13 DIAGNOSIS — M791 Myalgia: Secondary | ICD-10-CM | POA: Diagnosis not present

## 2015-01-13 DIAGNOSIS — Z79899 Other long term (current) drug therapy: Secondary | ICD-10-CM | POA: Diagnosis not present

## 2015-01-13 DIAGNOSIS — R112 Nausea with vomiting, unspecified: Secondary | ICD-10-CM | POA: Diagnosis present

## 2015-01-13 LAB — COMPREHENSIVE METABOLIC PANEL
ALK PHOS: 76 U/L (ref 39–117)
ALT: 11 U/L (ref 0–35)
AST: 23 U/L (ref 0–37)
Albumin: 3.7 g/dL (ref 3.5–5.2)
Anion gap: 7 (ref 5–15)
BUN: 13 mg/dL (ref 6–23)
CALCIUM: 8.8 mg/dL (ref 8.4–10.5)
CHLORIDE: 108 meq/L (ref 96–112)
CO2: 25 mmol/L (ref 19–32)
CREATININE: 0.69 mg/dL (ref 0.50–1.10)
GFR calc Af Amer: >90 mL/min (ref >90)
GFR calc non Af Amer: 85 mL/min — ABNORMAL LOW (ref >90)
Glucose, Bld: 115 mg/dL — ABNORMAL HIGH (ref 70–99)
Potassium: 3.3 mmol/L — ABNORMAL LOW (ref 3.5–5.1)
Sodium: 140 mmol/L (ref 135–145)
TOTAL PROTEIN: 7.8 g/dL (ref 6.0–8.3)
Total Bilirubin: 0.5 mg/dL (ref 0.3–1.2)

## 2015-01-13 LAB — CBC WITH DIFFERENTIAL/PLATELET
BASOS PCT: 1 % (ref 0–1)
Basophils Absolute: 0.1 10*3/uL (ref 0.0–0.1)
Eosinophils Absolute: 0.2 10*3/uL (ref 0.0–0.7)
Eosinophils Relative: 2 % (ref 0–5)
HEMATOCRIT: 42.7 % (ref 36.0–46.0)
HEMOGLOBIN: 13.5 g/dL (ref 12.0–15.0)
Lymphocytes Relative: 32 % (ref 12–46)
Lymphs Abs: 2.6 10*3/uL (ref 0.7–4.0)
MCH: 28.5 pg (ref 26.0–34.0)
MCHC: 31.6 g/dL (ref 30.0–36.0)
MCV: 90.1 fL (ref 78.0–100.0)
MONO ABS: 0.5 10*3/uL (ref 0.1–1.0)
MONOS PCT: 6 % (ref 3–12)
NEUTROS ABS: 4.9 10*3/uL (ref 1.7–7.7)
Neutrophils Relative %: 59 % (ref 43–77)
PLATELETS: 278 10*3/uL (ref 150–400)
RBC: 4.74 MIL/uL (ref 3.87–5.11)
RDW: 13.8 % (ref 11.5–15.5)
WBC: 8.3 10*3/uL (ref 4.0–10.5)

## 2015-01-13 MED ORDER — SODIUM CHLORIDE 0.9 % IV BOLUS (SEPSIS)
1000.0000 mL | Freq: Once | INTRAVENOUS | Status: AC
  Administered 2015-01-13: 1000 mL via INTRAVENOUS

## 2015-01-13 MED ORDER — ONDANSETRON HCL 4 MG/2ML IJ SOLN
4.0000 mg | Freq: Once | INTRAMUSCULAR | Status: AC
Start: 2015-01-13 — End: 2015-01-13
  Administered 2015-01-13: 4 mg via INTRAVENOUS
  Filled 2015-01-13: qty 2

## 2015-01-13 MED ORDER — ONDANSETRON 4 MG PO TBDP: ORAL_TABLET | ORAL | Status: DC

## 2015-01-13 NOTE — ED Provider Notes (Signed)
CSN: [id number]     Arrival date & time 01/13/15  1110 History  This chart was scribed for Maudry Diego, MD by Stephania Fragmin, ED Scribe. This patient was seen in room APA19/APA19 and the patient's care was started at 11:30 AM.    Chief Complaint  Patient presents with  . Emesis   Patient is a 74 y.o. female presenting with vomiting. The history is provided by the patient, a relative and medical records. No language interpreter was used.  Emesis Severity:  Moderate Duration:  5 hours Timing:  Constant Quality:  Stomach contents Able to tolerate:  Liquids Progression:  Unchanged Chronicity:  New Context: not post-tussive and not self-induced   Context comment:  Triggered by medications on an empty stomach Relieved by:  None tried Worsened by:  Nothing tried Ineffective treatments:  None tried Associated symptoms: abdominal pain, cough and myalgias   Associated symptoms: no chills, no diarrhea and no headaches   Cough:    Cough characteristics:  Productive   Sputum characteristics:  Yellow   Severity:  Moderate    HPI Comments: Kim [name] is a 74 y.o. female who presents to the Emergency Department complaining of emesis that began this morning. Per granddaughter, patient had seen her PCP 2 days ago for body aches and prescribed antibiotics to be taken BID, as well as hydrocodone syrup. She then took both this morning on an empty stomach and has been vomiting since, despite eating food. Her family notes associated productive cough with yellow sputum, congestion, and nausea. She also states no improvement with her body aches since being seen 2 days ago, per nursing notes.  Past Medical History  Diagnosis Date  . Arthritis   . Hypertension    Past Surgical History  Procedure Laterality Date  . Tubal ligation    . Knee surgery     Family History  Problem Relation Age of Onset  . Heart failure Mother   . Stroke Mother   . Heart failure Father   . Stroke Father    History   Substance Use Topics  . Smoking status: Never Smoker   . Smokeless tobacco: Not on file  . Alcohol Use: No   OB History    No data available     Review of Systems  Constitutional: Negative for fever and chills.  HENT: Positive for congestion. Negative for ear discharge and sinus pressure.   Eyes: Negative for discharge.  Respiratory: Positive for cough.   Cardiovascular: Negative for chest pain.  Gastrointestinal: Positive for nausea, vomiting and abdominal pain. Negative for diarrhea.  Genitourinary: Negative for frequency and hematuria.  Musculoskeletal: Positive for myalgias. Negative for back pain.  Skin: Negative for rash.  Neurological: Negative for seizures and headaches.  Psychiatric/Behavioral: Negative for hallucinations.      Allergies  Review of patient's allergies indicates no known allergies.  Home Medications   Prior to Admission medications   Medication Sig Start Date End Date Taking? Authorizing Provider  acetaminophen (TYLENOL) 325 MG tablet Take 650 mg by mouth every 6 (six) hours as needed.    Historical Provider, MD  triamterene-hydrochlorothiazide (DYAZIDE) 37.5-25 MG per capsule Take 1 capsule by mouth daily.    Historical Provider, MD   BP 161/90 mmHg  Pulse 102  Temp(Src) 98.3 F (36.8 C) (Oral)  Resp 20  Ht 5\' 3"  (1.6 m)  Wt 197 lb (89.359 kg)  BMI 34.91 kg/m2  SpO2 94% Physical Exam  Constitutional: She is oriented to person, place,  and time. She appears well-developed.  HENT:  Head: Normocephalic.  Eyes: Conjunctivae and EOM are normal. No scleral icterus.  Neck: Neck supple. No thyromegaly present.  Cardiovascular: Normal rate and regular rhythm.  Exam reveals no gallop and no friction rub.   No murmur heard. Pulmonary/Chest: No stridor. She has no wheezes. She has no rales. She exhibits no tenderness.  Abdominal: She exhibits no distension. There is tenderness. There is no rebound.  Minimal epigastric tenderness.  Musculoskeletal:  Normal range of motion. She exhibits no edema.  Lymphadenopathy:    She has no cervical adenopathy.  Neurological: She is oriented to person, place, and time. She exhibits normal muscle tone. Coordination normal.  Skin: No rash noted. No erythema.  Psychiatric: She has a normal mood and affect. Her behavior is normal.    ED Course  Procedures (including critical care time)  DIAGNOSTIC STUDIES: Oxygen Saturation is 94% on room air, adequate by my interpretation.     Labs Review Labs Reviewed - No data to display  Imaging Review No results found.   EKG Interpretation None      MDM   Final diagnoses:  None     Uri with vomiting.  Will add zofran and have pt follow up if not improving.   Maudry Diego, MD 01/13/15 1355

## 2015-01-13 NOTE — Discharge Instructions (Signed)
Follow up with your md next week for recheck.  Drink plenty of fluids °

## 2015-01-13 NOTE — ED Notes (Addendum)
Pt reports vomiting,body aches since this am. nad noted. Pt tearful in triage.pt reports seen for same on Tuesday at pcp but reports no improvement. Pt reports was px abx and cough medication.

## 2015-03-17 ENCOUNTER — Ambulatory Visit: Payer: PRIVATE HEALTH INSURANCE | Admitting: Obstetrics & Gynecology

## 2015-03-21 ENCOUNTER — Ambulatory Visit: Payer: Medicare Other | Admitting: Obstetrics & Gynecology

## 2015-03-24 ENCOUNTER — Ambulatory Visit (INDEPENDENT_AMBULATORY_CARE_PROVIDER_SITE_OTHER): Payer: Medicare Other | Admitting: Obstetrics & Gynecology

## 2015-03-24 ENCOUNTER — Encounter: Payer: Self-pay | Admitting: Obstetrics & Gynecology

## 2015-03-24 VITALS — BP 130/70 | HR 84 | Wt 192.0 lb

## 2015-03-24 DIAGNOSIS — N812 Incomplete uterovaginal prolapse: Secondary | ICD-10-CM | POA: Diagnosis not present

## 2015-03-24 NOTE — Progress Notes (Signed)
Patient ID: Kim [name], female   DOB: [birth year], 74 y.o.   MRN: [id number]      Kim [name] presents today for routine follow up related to her pessary.   She uses a Milex ring with support #5 She reports no vaginal discharge or vaginal bleeding.  Exam reveals no undue vaginal mucosal pressure of breakdown, no discharge and no vaginal bleeding.  The pessary is removed, cleaned and replaced without difficulty.    Mariska Daffin will be sen back in 3 months for continued follow up.  Zi Sek H 03/24/2015 10:33 AM

## 2015-04-12 ENCOUNTER — Ambulatory Visit: Payer: Medicare Other

## 2015-04-15 ENCOUNTER — Telehealth: Payer: Self-pay | Admitting: *Deleted

## 2015-04-15 NOTE — Telephone Encounter (Signed)
Pt states she is scheduled to see Dr. Amalia Hailey on 04/18/2015 and will not be able to make the appt, please call back.

## 2015-04-18 ENCOUNTER — Ambulatory Visit: Payer: Medicare Other | Admitting: Podiatry

## 2015-06-24 ENCOUNTER — Ambulatory Visit (INDEPENDENT_AMBULATORY_CARE_PROVIDER_SITE_OTHER): Payer: Medicare Other | Admitting: Obstetrics & Gynecology

## 2015-06-24 ENCOUNTER — Encounter: Payer: Self-pay | Admitting: Obstetrics & Gynecology

## 2015-06-24 VITALS — BP 140/70 | HR 76 | Wt 197.0 lb

## 2015-06-24 DIAGNOSIS — N812 Incomplete uterovaginal prolapse: Secondary | ICD-10-CM

## 2015-06-24 NOTE — Progress Notes (Signed)
Patient ID: Kim [name], female   DOB: 1941-03-21, 74 y.o.   MRN: [id number] Chief Complaint  Patient presents with  . gyn visit    clean pessary.    Blood pressure 140/70, pulse 76, weight 197 lb (89.359 kg).  Kim [name] presents today for routine follow up related to her pessary.   She uses a milex ring with support #5 She reports no vaginal discharge or vaginal bleeding.  Exam reveals no undue vaginal mucosal pressure of breakdown, no discharge and no vaginal bleeding.  The pessary is removed, cleaned and replaced without difficulty.    Kim [name] will be sen back in 4 months for continued follow up.  @[facility] @ 06/24/2015 10:38 AM

## 2015-08-26 ENCOUNTER — Ambulatory Visit: Payer: Medicare Other | Admitting: Podiatry

## 2015-08-30 ENCOUNTER — Encounter: Payer: Self-pay | Admitting: Podiatry

## 2015-08-30 ENCOUNTER — Ambulatory Visit (INDEPENDENT_AMBULATORY_CARE_PROVIDER_SITE_OTHER): Payer: Medicare Other | Admitting: Podiatry

## 2015-08-30 VITALS — BP 148/89 | HR 75 | Resp 16

## 2015-08-30 DIAGNOSIS — L600 Ingrowing nail: Secondary | ICD-10-CM

## 2015-08-30 NOTE — Patient Instructions (Signed)

## 2015-08-31 ENCOUNTER — Telehealth: Payer: Self-pay | Admitting: Podiatry

## 2015-08-31 NOTE — Telephone Encounter (Addendum)
Pt states she is in severe pain after her ingrown toenail procedure yesterday, request pain medication and is allergic to Tramadol.  Unable to leave message to pick up Hydrocodone 5/325mg  #15 1 tablet every 6 hours prn pain, from [place] office, because mailbox was full.  Informed pt, she could pick up the rx in the [place] office, she ask if her grddtr, Grayland Ormond could pick up the rx and I said she could.  Pt states she is soaking but still bleeding.  I told her it will take 4-6 weeks before the area would develop a dry hard scab, continue soaks and the medication afterward as directed.  Pt agrees.

## 2015-08-31 NOTE — Telephone Encounter (Signed)
Patient request a Rx for pain. State's she is in severe pain from her appointment yesterday 08.30.2016. Stated she is allergic to Tramadol.

## 2015-08-31 NOTE — Progress Notes (Signed)
Subjective:     Patient ID: Kim [name], female   DOB: 15-Apr-1941, 74 y.o.   MRN: [id number]  HPI patient presents stating that she has a painful ingrown toenail of her right big toe and it is making it hard to wear shoe gear comfortably   Review of Systems     Objective:   Physical Exam Neurovascular status intact muscle strength adequate with range of motion within normal limits. 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 She wants it fixed permanently and I did explain risk and she is comfortable with this and today I infiltrated the right hallux 60 mg Xylocaine Marcaine mixture remove the medial border exposed matrix and applied phenol 3 applications 30 seconds followed by alcohol lavage and sterile dressing. They've instructions on soaks and reappoint

## 2015-08-31 NOTE — Telephone Encounter (Signed)
She can have vicodin and make sure she is  soaking

## 2015-09-01 ENCOUNTER — Telehealth: Payer: Self-pay | Admitting: *Deleted

## 2015-09-01 MED ORDER — HYDROCODONE-ACETAMINOPHEN 5-325 MG PO TABS: 1.0000 | ORAL_TABLET | Freq: Four times a day (QID) | ORAL | Status: DC | PRN

## 2015-09-01 NOTE — Telephone Encounter (Signed)
Called patient at [phone] (Home #) to check to see how they were feeling from their ingrown toenail procedure that was performed on Tuesday, August 30, 2015. Pt stated, "Hurt during the first night and still feels a little sore today". Pt is soaking their toe and taking Tylenol with some relief.

## 2015-09-16 ENCOUNTER — Emergency Department (HOSPITAL_COMMUNITY): Payer: Medicare Other

## 2015-09-16 ENCOUNTER — Emergency Department (HOSPITAL_COMMUNITY)
Admission: EM | Admit: 2015-09-16 | Discharge: 2015-09-16 | Disposition: A | Discharge status: Home / Self Care | Payer: Medicare Other | Attending: Emergency Medicine | Admitting: Emergency Medicine

## 2015-09-16 ENCOUNTER — Encounter (HOSPITAL_COMMUNITY): Payer: Self-pay | Admitting: Emergency Medicine

## 2015-09-16 DIAGNOSIS — Z79899 Other long term (current) drug therapy: Secondary | ICD-10-CM | POA: Diagnosis not present

## 2015-09-16 DIAGNOSIS — S0181XA Laceration without foreign body of other part of head, initial encounter: Secondary | ICD-10-CM | POA: Diagnosis not present

## 2015-09-16 DIAGNOSIS — S8991XA Unspecified injury of right lower leg, initial encounter: Secondary | ICD-10-CM | POA: Insufficient documentation

## 2015-09-16 DIAGNOSIS — W010XXA Fall on same level from slipping, tripping and stumbling without subsequent striking against object, initial encounter: Secondary | ICD-10-CM | POA: Diagnosis not present

## 2015-09-16 DIAGNOSIS — Y92090 Kitchen in other non-institutional residence as the place of occurrence of the external cause: Secondary | ICD-10-CM | POA: Insufficient documentation

## 2015-09-16 DIAGNOSIS — S8992XA Unspecified injury of left lower leg, initial encounter: Secondary | ICD-10-CM | POA: Diagnosis not present

## 2015-09-16 DIAGNOSIS — Y998 Other external cause status: Secondary | ICD-10-CM | POA: Diagnosis not present

## 2015-09-16 DIAGNOSIS — Z23 Encounter for immunization: Secondary | ICD-10-CM | POA: Insufficient documentation

## 2015-09-16 DIAGNOSIS — Y9389 Activity, other specified: Secondary | ICD-10-CM | POA: Insufficient documentation

## 2015-09-16 DIAGNOSIS — IMO0002 Reserved for concepts with insufficient information to code with codable children: Secondary | ICD-10-CM

## 2015-09-16 DIAGNOSIS — S161XXA Strain of muscle, fascia and tendon at neck level, initial encounter: Secondary | ICD-10-CM | POA: Insufficient documentation

## 2015-09-16 DIAGNOSIS — M1990 Unspecified osteoarthritis, unspecified site: Secondary | ICD-10-CM | POA: Diagnosis not present

## 2015-09-16 DIAGNOSIS — I10 Essential (primary) hypertension: Secondary | ICD-10-CM | POA: Diagnosis not present

## 2015-09-16 MED ORDER — TETANUS-DIPHTH-ACELL PERTUSSIS 5-2.5-18.5 LF-MCG/0.5 IM SUSP
0.5000 mL | Freq: Once | INTRAMUSCULAR | Status: AC
  Administered 2015-09-16: 0.5 mL via INTRAMUSCULAR
  Filled 2015-09-16: qty 0.5

## 2015-09-16 MED ORDER — HYDROCODONE-ACETAMINOPHEN 5-325 MG PO TABS: 0.5000 | ORAL_TABLET | ORAL | Status: DC | PRN

## 2015-09-16 NOTE — Discharge Instructions (Signed)

## 2015-09-16 NOTE — ED Notes (Signed)
Cervical collar on and aligned. Pt ready for transport to x-ray

## 2015-09-16 NOTE — ED Notes (Signed)
Pt reports tripped and fell in the kitchen this am and hit chin on the floor. Small laceration noted to chin. Minimal bleeding to site. Pt denies loc.

## 2015-09-16 NOTE — ED Notes (Signed)
PA at bedside.

## 2015-09-16 NOTE — ED Notes (Signed)
Notified by registration that patient left.  

## 2015-09-16 NOTE — ED Notes (Signed)
Patient returned to ED, stated she went to Urgent Care and they advised her she needed to go to ED.

## 2015-09-19 NOTE — ED Provider Notes (Signed)
CSN: [id number]     Arrival date & time 09/16/15  1157 History   First MD Initiated Contact with Patient 09/16/15 1355     Chief Complaint  Patient presents with  . Laceration     (Consider location/radiation/quality/duration/timing/severity/associated sxs/prior Treatment) The history is provided by the patient and a relative.   Kim [name] is a 74 y.o. female with past medical history significant for HTN and arthritis, not on blood thinners, presenting with laceration to her chin sustained from a fall when she tripped on a rug in her kitchen causing fall prior to arrival. She denies dizziness or lightheadedness, palpitations or LOC before , during or since the fall.  She denies headache, facial, jaw, dental or mouth pain since the event.  She also denies chest pain,  back pain, focal weakness or numbness in her extremities but does note posterior neck soreness. She also states her knees are sore since falling but has been ambulatory. She applied direct pressure to the wound which has stopped bleeding.       Past Medical History  Diagnosis Date  . Arthritis   . Hypertension    Past Surgical History  Procedure Laterality Date  . Tubal ligation    . Knee surgery     Family History  Problem Relation Age of Onset  . Heart failure Mother   . Stroke Mother   . Heart failure Father   . Stroke Father    Social History  Substance Use Topics  . Smoking status: Never Smoker   . Smokeless tobacco: None  . Alcohol Use: No   OB History    No data available     Review of Systems  Constitutional: Negative for fever.  HENT: Negative for dental problem, ear pain, facial swelling and mouth sores.   Respiratory: Negative for shortness of breath.   Cardiovascular: Negative for chest pain.  Gastrointestinal: Negative for nausea, vomiting, abdominal pain and constipation.  Genitourinary: Negative for dysuria, urgency, frequency, flank pain and difficulty urinating.  Musculoskeletal:  Positive for neck pain. Negative for back pain, joint swelling and gait problem.  Skin: Positive for wound. Negative for rash.  Neurological: Negative for dizziness, facial asymmetry, speech difficulty, weakness, light-headedness, numbness and headaches.  Hematological: Does not bruise/bleed easily.      Allergies  Tramadol  Home Medications   Prior to Admission medications   Medication Sig Start Date End Date Taking? Authorizing Provider  acetaminophen (TYLENOL) 325 MG tablet Take 325 mg by mouth 2 (two) times daily.    Yes Historical Provider, MD  triamterene-hydrochlorothiazide (MAXZIDE-25) 37.5-25 MG per tablet Take 1 tablet by mouth daily. 09/07/15  Yes Historical Provider, MD  HYDROcodone-acetaminophen (NORCO/VICODIN) 5-325 MG per tablet Take 0.5-1 tablets by mouth every 4 (four) hours as needed for moderate pain. 09/16/15   Evalee Jefferson, PA-C   BP 143/75 mmHg  Pulse 75  Temp(Src) 98.6 F (37 C) (Oral)  Resp 16  Ht 5\' 3"  (1.6 m)  Wt 203 lb (92.08 kg)  BMI 35.97 kg/m2  SpO2 99% Physical Exam  Constitutional: She appears well-developed and well-nourished. No distress.  HENT:  Head: Normocephalic. Head is with laceration. Head is without raccoon's eyes and without Battle's sign.  Right Ear: No hemotympanum.  Left Ear: No hemotympanum.  Mouth/Throat: Uvula is midline and oropharynx is clear and moist.  0.5 cm subcutaneous laceration inferior anterior chin edge, hemostatic.  Upper edentulous, lower partially edentulous, no dental trauma.  Point tender at laceration site, no palpable deformity,  no TMJ pain.  Eyes: Conjunctivae and EOM are normal. Pupils are equal, round, and reactive to light.  Neck: Normal range of motion.  Cardiovascular: Normal rate, regular rhythm, normal heart sounds and intact distal pulses.   Pulmonary/Chest: Effort normal and breath sounds normal. She has no wheezes.  Abdominal: Soft. Bowel sounds are normal. There is no tenderness.  Musculoskeletal:  Normal range of motion. She exhibits no edema.       Right knee: She exhibits erythema and bony tenderness. She exhibits normal range of motion, no swelling, no effusion, no ecchymosis and no deformity.       Left knee: She exhibits erythema and bony tenderness. She exhibits normal range of motion, no swelling, no effusion, no ecchymosis and no deformity.  Mild ttp anterior bilateral patella.  Neurological: She is alert. She has normal strength. No sensory deficit. Coordination and gait normal.  Skin: Skin is warm and dry.  Psychiatric: She has a normal mood and affect.  Nursing note and vitals reviewed.   ED Course  Procedures (including critical care time)  LACERATION REPAIR Performed by: Evalee Jefferson Authorized by: Evalee Jefferson Consent: Verbal consent obtained. Risks and benefits: risks, benefits and alternatives were discussed Consent given by: patient Patient identity confirmed: provided demographic data Prepped and Draped in normal sterile fashion Wound explored  Laceration Location: chin  Laceration Length: 0.5 cm  No Foreign Bodies seen or palpated  Anesthesia: na Local anesthetic: na Anesthetic total: na Irrigation method: syringe Amount of cleaning: standard, betadine followed by NS.  Skin closure: dermabond  Number of sutures: dermabond Technique:dermabond  Patient tolerance: Patient tolerated the procedure well with no immediate complications.  Labs Review   Imaging Review  EXAM: CT HEAD WITHOUT CONTRAST  CT CERVICAL SPINE WITHOUT CONTRAST  TECHNIQUE: Multidetector CT imaging of the head and cervical spine was performed following the standard protocol without intravenous contrast. Multiplanar CT image reconstructions of the cervical spine were also generated.  COMPARISON: Head CT 07/17/2002  FINDINGS: CT HEAD FINDINGS  Ventricles, cisterns and other CSF spaces are within normal. There is no mass, mass effect, shift of midline structures or  acute hemorrhage. There is no evidence of acute infarction. Bones soft tissues are within normal.  CT CERVICAL SPINE FINDINGS  Vertebral body alignment and heights are normal. There is mild spondylosis throughout the cervical spine. There is mild disc space narrowing from the C3-4 level to the C6-7 level. Prevertebral soft tissues are within normal. There is uncovertebral joint spurring and facet arthropathy. Atlantoaxial articulation is within normal. There is no acute fracture or subluxation. Bilateral neural foraminal narrowing is present at multiple levels due to adjacent bony spurring. Remainder the exam is within normal.  IMPRESSION: No acute intracranial findings.  No acute cervical spine injury.  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   Electronically Signed By: Marin Olp M.D. On: 09/16/2015 15:52          DG Knee Complete 4 Views Right (Final result) Result time: 09/16/15 15:25:56   Final result by Rad Results In Interface (09/16/15 15:25:56)   Narrative:   CLINICAL DATA: Fall earlier today, right knee pain  EXAM: RIGHT KNEE - COMPLETE 4+ VIEW  COMPARISON: 08/07/2010  FINDINGS: Moderate to severe tricompartmental osteoarthritis, most pronounced in the lateral compartment where there is marked joint space loss, sclerosis and osteophyte formation. Normal alignment. No acute fracture or effusion. No definite soft tissue abnormality.  IMPRESSION: Tricompartmental osteoarthritis. No acute  finding or effusion.   Electronically Signed By: Jerilynn Mages. Shick M.D. On: 09/16/2015 15:25          DG Knee Complete 4 Views Left (Final result) Result time: 09/16/15 15:27:33   Final result by Rad Results In Interface (09/16/15 15:27:33)   Narrative:   CLINICAL DATA: [place] today. Left knee pain.  EXAM: LEFT KNEE - COMPLETE 4+ VIEW  COMPARISON: None.  FINDINGS: The  joint spaces are fairly well maintained for age. Mild degenerative changes. No acute bony findings or osteochondral abnormality. No joint effusion.  IMPRESSION: No acute bony findings or joint effusion.   Electronically Signed By: Marijo Sanes M.D. On: 09/16/2015 15:27        I have personally reviewed and evaluated these images and lab results as part of my medical decision-making.    MDM   Final diagnoses:  Laceration  Cervical strain, acute, initial encounter     Radiological studies were viewed, interpreted and considered during the medical decision making and disposition process. I agree with radiologists reading.  Results were also discussed with patient.   Pt advised f/u here or with pcp for any persistent or worsened sx.  Dg at bedside and pt agree with plan.  Pt has tolerated hydrocodone in the recent past without excessive sedation.  She was prescribed for pain relief, advised 1/2 to 1 tablet dosing as needed.  Minor head injury instructions given. Advised ice tx prn.    The patient appears reasonably screened and/or stabilized for discharge and I doubt any other medical condition or other [facility] requiring further screening, evaluation, or treatment in the ED at this time prior to discharge.   Tetanus also updates as pt unaware of last update.   Evalee Jefferson, PA-C 09/19/15 1218  Evalee Jefferson, PA-C 09/19/15 1218  Tanna Furry, MD 09/21/15 [phone]

## 2015-10-11 ENCOUNTER — Ambulatory Visit (HOSPITAL_COMMUNITY)
Admission: RE | Admit: 2015-10-11 | Discharge: 2015-10-11 | Disposition: A | Discharge status: Home / Self Care | Payer: Medicare Other | Source: Ambulatory Visit | Attending: Family Medicine | Admitting: Family Medicine

## 2015-10-11 DIAGNOSIS — R0609 Other forms of dyspnea: Secondary | ICD-10-CM | POA: Diagnosis not present

## 2015-10-11 DIAGNOSIS — R0600 Dyspnea, unspecified: Secondary | ICD-10-CM | POA: Diagnosis not present

## 2015-10-24 ENCOUNTER — Ambulatory Visit: Payer: Medicare Other | Admitting: Obstetrics & Gynecology

## 2015-10-28 ENCOUNTER — Ambulatory Visit (INDEPENDENT_AMBULATORY_CARE_PROVIDER_SITE_OTHER): Payer: Medicare Other | Admitting: Obstetrics & Gynecology

## 2015-10-28 ENCOUNTER — Encounter: Payer: Self-pay | Admitting: Obstetrics & Gynecology

## 2015-10-28 VITALS — BP 128/70 | HR 72 | Wt 194.0 lb

## 2015-10-28 DIAGNOSIS — N812 Incomplete uterovaginal prolapse: Secondary | ICD-10-CM | POA: Diagnosis not present

## 2015-10-28 NOTE — Progress Notes (Signed)
Patient ID: Kim [name], female   DOB: 1941/08/02, 74 y.o.   MRN: [id number] Chief Complaint  Patient presents with  . 4  month follow-up    clean pessary.    Blood pressure 128/70, pulse 72, weight 194 lb (87.998 kg).  Kim [name] presents today for routine follow up related to her pessary.   She uses a Milex ring with support #5 She reports no vaginal discharge or vaginal bleeding.  Exam reveals no undue vaginal mucosal pressure of breakdown, no discharge and no vaginal bleeding.  The pessary is removed, cleaned and replaced without difficulty.    Kim [name] will be sen back in 4 months for continued follow up.  Florian Buff, MD   10/28/2015 11:45 AM

## 2016-01-31 ENCOUNTER — Other Ambulatory Visit (HOSPITAL_COMMUNITY): Payer: Self-pay | Admitting: Internal Medicine

## 2016-01-31 ENCOUNTER — Ambulatory Visit (HOSPITAL_COMMUNITY)
Admission: RE | Admit: 2016-01-31 | Discharge: 2016-01-31 | Disposition: A | Discharge status: Home / Self Care | Payer: Medicare Other | Source: Ambulatory Visit | Attending: Internal Medicine | Admitting: Internal Medicine

## 2016-01-31 DIAGNOSIS — M549 Dorsalgia, unspecified: Secondary | ICD-10-CM | POA: Insufficient documentation

## 2016-01-31 DIAGNOSIS — M5136 Other intervertebral disc degeneration, lumbar region: Secondary | ICD-10-CM | POA: Diagnosis not present

## 2016-02-28 ENCOUNTER — Ambulatory Visit (INDEPENDENT_AMBULATORY_CARE_PROVIDER_SITE_OTHER): Payer: Medicare Other | Admitting: Obstetrics & Gynecology

## 2016-02-28 ENCOUNTER — Encounter: Payer: Self-pay | Admitting: Obstetrics & Gynecology

## 2016-02-28 VITALS — BP 112/80 | HR 135 | Ht 63.0 in | Wt 191.0 lb

## 2016-02-28 DIAGNOSIS — N812 Incomplete uterovaginal prolapse: Secondary | ICD-10-CM | POA: Diagnosis not present

## 2016-02-28 NOTE — Progress Notes (Signed)
Patient ID: Kim [name], female   DOB: Nov 29, 1941, 75 y.o.   MRN: [id number] Chief Complaint  Patient presents with  . Pessary Check    clean pessary     Blood pressure 112/80, pulse 135, height 5\' 3"  (1.6 m), weight 191 lb (86.637 kg).  Kim [name] presents today for routine follow up related to her pessary.   She uses a milex ring with support #6 She reports no vaginal discharge or vaginal bleeding.  Exam reveals no undue vaginal mucosal pressure of breakdown, no discharge and no vaginal bleeding.  The pessary is removed, cleaned and replaced without difficulty.    Kim [name] will be sen back in 4 months for continued follow up.  Florian Buff, MD   02/28/2016 10:41 AM

## 2016-04-18 ENCOUNTER — Encounter: Payer: Self-pay | Admitting: Podiatry

## 2016-04-18 ENCOUNTER — Ambulatory Visit (INDEPENDENT_AMBULATORY_CARE_PROVIDER_SITE_OTHER): Payer: Medicare Other | Admitting: Podiatry

## 2016-04-18 VITALS — Temp 97.3°F

## 2016-04-18 DIAGNOSIS — L600 Ingrowing nail: Secondary | ICD-10-CM | POA: Diagnosis not present

## 2016-04-18 DIAGNOSIS — Q828 Other specified congenital malformations of skin: Secondary | ICD-10-CM

## 2016-04-18 DIAGNOSIS — L03011 Cellulitis of right finger: Secondary | ICD-10-CM | POA: Diagnosis not present

## 2016-04-18 DIAGNOSIS — B351 Tinea unguium: Secondary | ICD-10-CM | POA: Diagnosis not present

## 2016-04-18 NOTE — Patient Instructions (Addendum)

## 2016-04-18 NOTE — Progress Notes (Signed)
Subjective:     Patient ID: Kim [name], female   DOB: [birth year], 75 y.o.   MRN: [id number]  HPI patient states I developed redness and drainage around my big toe right on the medial side with a blister with no redness into my metatarsal. States it's been sore and she's tried soaks without relief and has been present for several weeks   Review of Systems     Objective:   Physical Exam Neurovascular status intact muscle strength adequate with patient found to have erythema and blister of the right hallux medial side with localized drainage and discomfort when pressed. 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 I've recommended removal of the corner and I infiltrated the right hallux 60 mg Xylocaine Marcaine mixture remove the medial corner removed all present proud flesh and abscess tissue and necrotic tissue and created channel for drainage. Instructed on soaks and reappoint

## 2016-04-27 ENCOUNTER — Telehealth: Payer: Self-pay | Admitting: *Deleted

## 2016-04-27 NOTE — Telephone Encounter (Signed)
Called patient at [phone] (Home #) to check to see how they were doing from their ingrown toenail procedure that was performed on Wednesday, April 18, 2016. Pt stated, " Doing okay, but still has some drainage". I told patient that it is normal to have some drainage for approximately 2-4 weeks after the procedure. Pt is soaking toe with relief.

## 2016-06-19 ENCOUNTER — Other Ambulatory Visit (HOSPITAL_COMMUNITY): Payer: Self-pay | Admitting: Internal Medicine

## 2016-06-19 DIAGNOSIS — Z1231 Encounter for screening mammogram for malignant neoplasm of breast: Secondary | ICD-10-CM

## 2016-06-25 ENCOUNTER — Ambulatory Visit (HOSPITAL_COMMUNITY)
Admission: RE | Admit: 2016-06-25 | Discharge: 2016-06-25 | Disposition: A | Discharge status: Home / Self Care | Payer: Medicare Other | Source: Ambulatory Visit | Attending: Internal Medicine | Admitting: Internal Medicine

## 2016-06-25 DIAGNOSIS — Z1231 Encounter for screening mammogram for malignant neoplasm of breast: Secondary | ICD-10-CM | POA: Insufficient documentation

## 2016-06-28 ENCOUNTER — Encounter: Payer: Self-pay | Admitting: Obstetrics & Gynecology

## 2016-06-28 ENCOUNTER — Ambulatory Visit (INDEPENDENT_AMBULATORY_CARE_PROVIDER_SITE_OTHER): Payer: Medicare Other | Admitting: Obstetrics & Gynecology

## 2016-06-28 VITALS — BP 110/80 | HR 80 | Wt 186.4 lb

## 2016-06-28 DIAGNOSIS — N812 Incomplete uterovaginal prolapse: Secondary | ICD-10-CM | POA: Diagnosis not present

## 2016-06-28 NOTE — Progress Notes (Signed)
Patient ID: Kim [name], female   DOB: 1941-05-31, 75 y.o.   MRN: [id number] Patient ID: Kim [name], female   DOB: 1941-09-16, 75 y.o.   MRN: [id number] Chief Complaint  Patient presents with  . 4 month follow-up    clean- check pessary    Blood pressure 110/80, pulse 80, weight 186 lb 6.4 oz (84.55 kg).  Kim [name] presents today for routine follow up related to her pessary.   She uses a milex ring with support #6 She reports no vaginal discharge or vaginal bleeding.  Exam reveals no undue vaginal mucosal pressure of breakdown, no discharge and no vaginal bleeding.  The pessary is removed, cleaned and replaced without difficulty.    Kim [name] will be sen back in 4 months for continued follow up.  Florian Buff, MD   06/28/2016 10:44 AM

## 2016-10-29 ENCOUNTER — Ambulatory Visit: Payer: Medicare Other | Admitting: Obstetrics & Gynecology

## 2016-11-01 ENCOUNTER — Telehealth: Payer: Self-pay

## 2016-11-01 ENCOUNTER — Encounter (INDEPENDENT_AMBULATORY_CARE_PROVIDER_SITE_OTHER): Payer: Self-pay

## 2016-11-01 ENCOUNTER — Encounter: Payer: Self-pay | Admitting: Obstetrics & Gynecology

## 2016-11-01 ENCOUNTER — Ambulatory Visit (INDEPENDENT_AMBULATORY_CARE_PROVIDER_SITE_OTHER): Payer: Medicare Other | Admitting: Obstetrics & Gynecology

## 2016-11-01 VITALS — BP 110/80 | HR 76 | Wt 183.4 lb

## 2016-11-01 DIAGNOSIS — N812 Incomplete uterovaginal prolapse: Secondary | ICD-10-CM

## 2016-11-01 NOTE — Telephone Encounter (Signed)
Recall for tcs °

## 2016-11-01 NOTE — Telephone Encounter (Signed)
Letter mailed to pt.  

## 2016-11-01 NOTE — Progress Notes (Signed)
Patient ID: Kim [name], female   DOB: Jul 06, 1941, 75 y.o.   MRN: [id number] Patient ID: Kim [name], female   DOB: 1941/11/24, 75 y.o.   MRN: [id number] Chief Complaint  Patient presents with  . Pessary Check    clean    Blood pressure 110/80, pulse 76, weight 183 lb 6.4 oz (83.2 kg).  Kim [name] presents today for routine follow up related to her pessary.   She uses a milex ring with support #6 She reports no vaginal discharge or vaginal bleeding.  Exam reveals no undue vaginal mucosal pressure of breakdown, no discharge and no vaginal bleeding.  The pessary is removed, cleaned and replaced without difficulty.    Kim [name] will be sen back in 4 months for continued follow up.  Florian Buff, MD   11/01/2016 12:23 PM

## 2017-01-09 DIAGNOSIS — M20021 Boutonniere deformity of right finger(s): Secondary | ICD-10-CM | POA: Diagnosis not present

## 2017-01-09 DIAGNOSIS — M25469 Effusion, unspecified knee: Secondary | ICD-10-CM | POA: Diagnosis not present

## 2017-01-09 DIAGNOSIS — M25569 Pain in unspecified knee: Secondary | ICD-10-CM | POA: Diagnosis not present

## 2017-01-09 DIAGNOSIS — M21062 Valgus deformity, not elsewhere classified, left knee: Secondary | ICD-10-CM | POA: Diagnosis not present

## 2017-01-09 DIAGNOSIS — I10 Essential (primary) hypertension: Secondary | ICD-10-CM | POA: Diagnosis not present

## 2017-01-10 DIAGNOSIS — M20021 Boutonniere deformity of right finger(s): Secondary | ICD-10-CM | POA: Diagnosis not present

## 2017-01-10 DIAGNOSIS — M21062 Valgus deformity, not elsewhere classified, left knee: Secondary | ICD-10-CM | POA: Diagnosis not present

## 2017-01-10 DIAGNOSIS — M25469 Effusion, unspecified knee: Secondary | ICD-10-CM | POA: Diagnosis not present

## 2017-01-23 DIAGNOSIS — M25562 Pain in left knee: Secondary | ICD-10-CM | POA: Diagnosis not present

## 2017-01-23 DIAGNOSIS — R600 Localized edema: Secondary | ICD-10-CM | POA: Diagnosis not present

## 2017-01-23 DIAGNOSIS — M21062 Valgus deformity, not elsewhere classified, left knee: Secondary | ICD-10-CM | POA: Diagnosis not present

## 2017-01-24 ENCOUNTER — Other Ambulatory Visit (HOSPITAL_COMMUNITY): Payer: Self-pay | Admitting: Internal Medicine

## 2017-01-24 ENCOUNTER — Ambulatory Visit (HOSPITAL_COMMUNITY)
Admission: RE | Admit: 2017-01-24 | Discharge: 2017-01-24 | Disposition: A | Discharge status: Home / Self Care | Payer: Medicare Other | Source: Ambulatory Visit | Attending: Internal Medicine | Admitting: Internal Medicine

## 2017-01-24 DIAGNOSIS — G8929 Other chronic pain: Secondary | ICD-10-CM | POA: Diagnosis not present

## 2017-01-24 DIAGNOSIS — M85861 Other specified disorders of bone density and structure, right lower leg: Secondary | ICD-10-CM | POA: Insufficient documentation

## 2017-01-24 DIAGNOSIS — M25561 Pain in right knee: Secondary | ICD-10-CM | POA: Insufficient documentation

## 2017-01-24 DIAGNOSIS — M85862 Other specified disorders of bone density and structure, left lower leg: Secondary | ICD-10-CM | POA: Diagnosis not present

## 2017-01-24 DIAGNOSIS — M25562 Pain in left knee: Secondary | ICD-10-CM | POA: Insufficient documentation

## 2017-01-24 DIAGNOSIS — M25469 Effusion, unspecified knee: Secondary | ICD-10-CM | POA: Diagnosis not present

## 2017-01-24 DIAGNOSIS — M179 Osteoarthritis of knee, unspecified: Secondary | ICD-10-CM | POA: Diagnosis not present

## 2017-02-06 DIAGNOSIS — I10 Essential (primary) hypertension: Secondary | ICD-10-CM | POA: Diagnosis not present

## 2017-02-06 DIAGNOSIS — M25561 Pain in right knee: Secondary | ICD-10-CM | POA: Diagnosis not present

## 2017-02-06 DIAGNOSIS — M25562 Pain in left knee: Secondary | ICD-10-CM | POA: Diagnosis not present

## 2017-02-07 ENCOUNTER — Other Ambulatory Visit (HOSPITAL_COMMUNITY): Payer: Self-pay | Admitting: Internal Medicine

## 2017-02-07 DIAGNOSIS — Z780 Asymptomatic menopausal state: Secondary | ICD-10-CM

## 2017-02-11 ENCOUNTER — Ambulatory Visit (HOSPITAL_COMMUNITY)
Admission: RE | Admit: 2017-02-11 | Discharge: 2017-02-11 | Disposition: A | Discharge status: Home / Self Care | Payer: Medicare Other | Source: Ambulatory Visit | Attending: Internal Medicine | Admitting: Internal Medicine

## 2017-02-11 DIAGNOSIS — M85851 Other specified disorders of bone density and structure, right thigh: Secondary | ICD-10-CM | POA: Diagnosis not present

## 2017-02-11 DIAGNOSIS — M8588 Other specified disorders of bone density and structure, other site: Secondary | ICD-10-CM | POA: Insufficient documentation

## 2017-02-11 DIAGNOSIS — Z780 Asymptomatic menopausal state: Secondary | ICD-10-CM | POA: Diagnosis not present

## 2017-02-26 ENCOUNTER — Other Ambulatory Visit: Payer: Self-pay

## 2017-02-26 ENCOUNTER — Encounter (HOSPITAL_COMMUNITY): Payer: Self-pay | Admitting: Emergency Medicine

## 2017-02-26 ENCOUNTER — Emergency Department (HOSPITAL_COMMUNITY)
Admission: EM | Admit: 2017-02-26 | Discharge: 2017-02-26 | Disposition: A | Discharge status: Home / Self Care | Payer: Medicare Other | Attending: Emergency Medicine | Admitting: Emergency Medicine

## 2017-02-26 ENCOUNTER — Emergency Department (HOSPITAL_COMMUNITY): Payer: Medicare Other

## 2017-02-26 DIAGNOSIS — R079 Chest pain, unspecified: Secondary | ICD-10-CM | POA: Diagnosis not present

## 2017-02-26 DIAGNOSIS — Z79899 Other long term (current) drug therapy: Secondary | ICD-10-CM | POA: Insufficient documentation

## 2017-02-26 DIAGNOSIS — I10 Essential (primary) hypertension: Secondary | ICD-10-CM | POA: Insufficient documentation

## 2017-02-26 DIAGNOSIS — K2100 Gastro-esophageal reflux disease with esophagitis, without bleeding: Secondary | ICD-10-CM

## 2017-02-26 DIAGNOSIS — R1013 Epigastric pain: Secondary | ICD-10-CM | POA: Diagnosis not present

## 2017-02-26 LAB — HEPATIC FUNCTION PANEL
ALBUMIN: 3.3 g/dL — AB (ref 3.5–5.0)
ALK PHOS: 74 U/L (ref 38–126)
ALT: 8 U/L — ABNORMAL LOW (ref 14–54)
AST: 20 U/L (ref 15–41)
Bilirubin, Direct: 0.1 mg/dL (ref 0.1–0.5)
Indirect Bilirubin: 0.2 mg/dL — ABNORMAL LOW (ref 0.3–0.9)
TOTAL PROTEIN: 6.8 g/dL (ref 6.5–8.1)
Total Bilirubin: 0.3 mg/dL (ref 0.3–1.2)

## 2017-02-26 LAB — CBC
HCT: 38.2 % (ref 36.0–46.0)
HEMOGLOBIN: 12.4 g/dL (ref 12.0–15.0)
MCH: 29 pg (ref 26.0–34.0)
MCHC: 32.5 g/dL (ref 30.0–36.0)
MCV: 89.3 fL (ref 78.0–100.0)
Platelets: 237 10*3/uL (ref 150–400)
RBC: 4.28 MIL/uL (ref 3.87–5.11)
RDW: 14.1 % (ref 11.5–15.5)
WBC: 7.8 10*3/uL (ref 4.0–10.5)

## 2017-02-26 LAB — LIPASE, BLOOD: LIPASE: 23 U/L (ref 11–51)

## 2017-02-26 LAB — BASIC METABOLIC PANEL
ANION GAP: 7 (ref 5–15)
BUN: 22 mg/dL — AB (ref 6–20)
CO2: 25 mmol/L (ref 22–32)
Calcium: 8.8 mg/dL — ABNORMAL LOW (ref 8.9–10.3)
Chloride: 107 mmol/L (ref 101–111)
Creatinine, Ser: 1.02 mg/dL — ABNORMAL HIGH (ref 0.44–1.00)
GFR, EST NON AFRICAN AMERICAN: 53 mL/min — AB (ref >60)
Glucose, Bld: 129 mg/dL — ABNORMAL HIGH (ref 65–99)
POTASSIUM: 3.4 mmol/L — AB (ref 3.5–5.1)
SODIUM: 139 mmol/L (ref 135–145)

## 2017-02-26 LAB — TROPONIN I: Troponin I: <0.03 ng/mL (ref <0.03)

## 2017-02-26 LAB — I-STAT CHEM 8, ED
BUN: 22 mg/dL — ABNORMAL HIGH (ref 6–20)
CHLORIDE: 104 mmol/L (ref 101–111)
Calcium, Ion: 1.16 mmol/L (ref 1.15–1.40)
Creatinine, Ser: 1 mg/dL (ref 0.44–1.00)
GLUCOSE: 128 mg/dL — AB (ref 65–99)
HCT: 39 % (ref 36.0–46.0)
Hemoglobin: 13.3 g/dL (ref 12.0–15.0)
POTASSIUM: 3.4 mmol/L — AB (ref 3.5–5.1)
Sodium: 142 mmol/L (ref 135–145)
TCO2: 25 mmol/L (ref 0–100)

## 2017-02-26 MED ORDER — PANTOPRAZOLE SODIUM 40 MG IV SOLR
40.0000 mg | Freq: Once | INTRAVENOUS | Status: AC
  Administered 2017-02-26: 40 mg via INTRAVENOUS
  Filled 2017-02-26: qty 40

## 2017-02-26 MED ORDER — IOPAMIDOL (ISOVUE-300) INJECTION 61%
INTRAVENOUS | Status: AC
  Administered 2017-02-26: 30 mL
  Filled 2017-02-26: qty 30

## 2017-02-26 MED ORDER — PANTOPRAZOLE SODIUM 20 MG PO TBEC: 20.0000 mg | DELAYED_RELEASE_TABLET | Freq: Every day | ORAL | 0 refills | Status: DC

## 2017-02-26 MED ORDER — METOCLOPRAMIDE HCL 5 MG PO TABS: 5.0000 mg | ORAL_TABLET | Freq: Three times a day (TID) | ORAL | 0 refills | Status: DC

## 2017-02-26 MED ORDER — METOCLOPRAMIDE HCL 5 MG/ML IJ SOLN
5.0000 mg | Freq: Once | INTRAMUSCULAR | Status: AC
  Administered 2017-02-26: 5 mg via INTRAVENOUS
  Filled 2017-02-26: qty 2

## 2017-02-26 MED ORDER — IOPAMIDOL (ISOVUE-300) INJECTION 61%
100.0000 mL | Freq: Once | INTRAVENOUS | Status: AC | PRN
  Administered 2017-02-26: 100 mL via INTRAVENOUS

## 2017-02-26 NOTE — Discharge Instructions (Signed)
Follow up with your md next week. °

## 2017-02-26 NOTE — ED Triage Notes (Signed)
Patient complaining of epigastric pain radiating into left arm x 3 days. Also complaining of generalized weakness starting this evening.

## 2017-02-26 NOTE — ED Notes (Signed)
Pt ambulatory to waiting room. Pt verbalized understanding of discharge instructions.   

## 2017-02-26 NOTE — ED Notes (Signed)
Patient transported to CT 

## 2017-02-26 NOTE — ED Provider Notes (Signed)
[facility] DEPT Provider Note   CSN: [id number] Arrival date & time: 02/26/17  [id number]     History   Chief Complaint Chief Complaint  Patient presents with  . Abdominal Pain  . Weakness    HPI Kim [name] is a 76 y.o. female.  Patient complains of epigastric abdominal pain   The history is provided by the patient. No language interpreter was used.  Abdominal Pain   This is a new problem. The current episode started more than 2 days ago. The problem occurs constantly. The problem has not changed since onset.The pain is associated with an unknown factor. The pain is located in the epigastric region. The quality of the pain is aching. The pain is at a severity of 6/10. The pain is moderate. Pertinent negatives include diarrhea, frequency, hematuria and headaches.  Weakness  Pertinent negatives include no chest pain and no headaches.    Past Medical History:  Diagnosis Date  . Arthritis   . Back spasm   . Hypertension     Patient Active Problem List   Diagnosis Date Noted  . Uterovaginal prolapse, incomplete 08/06/2013  . LOW BACK PAIN 01/09/2010  . BACK PAIN, CHRONIC 01/09/2010  . 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 KNEE SURGERY    . TUBAL LIGATION      OB History    No data available       Home Medications    Prior to Admission medications   Medication Sig Start Date End Date Taking? Authorizing Provider  lidocaine (XYLOCAINE) 5 % ointment Apply 1 application topically daily as needed for mild pain or moderate pain (for knee pain).  02/18/17  Yes Historical Provider, MD  meloxicam (MOBIC) 7.5 MG tablet Take 7.5 mg by mouth daily.   Yes Historical Provider, MD  triamterene-hydrochlorothiazide (MAXZIDE-25) 37.5-25 MG per tablet Take 1 tablet by mouth daily. 09/07/15  Yes Historical Provider, MD  metoCLOPramide (REGLAN) 5 MG tablet Take 1 tablet (5 mg total) by mouth 4 (four) times  daily -  before meals and at bedtime. 02/26/17   Milton Ferguson, MD  pantoprazole (PROTONIX) 20 MG tablet Take 1 tablet (20 mg total) by mouth daily. 02/26/17   Milton Ferguson, MD    Family History Family History  Problem Relation Age of Onset  . Heart failure Mother   . Stroke Mother   . Heart failure Father   . Stroke Father   . Hypertension Sister     Social History Social History  Substance Use Topics  . Smoking status: Never Smoker  . Smokeless tobacco: Never Used  . Alcohol use No     Allergies   Tramadol   Review of Systems Review of Systems  Constitutional: Negative for appetite change and fatigue.  HENT: Negative for congestion, ear discharge and sinus pressure.   Eyes: Negative for discharge.  Respiratory: Negative for cough.   Cardiovascular: Negative for chest pain.  Gastrointestinal: Positive for abdominal pain. Negative for diarrhea.  Genitourinary: Negative for frequency and hematuria.  Musculoskeletal: Negative for back pain.  Skin: Negative for rash.  Neurological: Positive for weakness. Negative for seizures and headaches.  Psychiatric/Behavioral: Negative for hallucinations.     Physical Exam Updated Vital Signs BP 120/69   Pulse 81   Temp 98.5 F (36.9 C) (Oral)   Resp 23   Ht 5\' 1"  (1.549 m)   Wt 183 lb (83 kg)   SpO2 100%  BMI 34.58 kg/m   Physical Exam  Constitutional: She is oriented to person, place, and time. She appears well-developed.  HENT:  Head: Normocephalic.  Eyes: Conjunctivae and EOM are normal. No scleral icterus.  Neck: Neck supple. No thyromegaly present.  Cardiovascular: Normal rate and regular rhythm.  Exam reveals no gallop and no friction rub.   No murmur heard. Pulmonary/Chest: No stridor. She has no wheezes. She has no rales. She exhibits no tenderness.  Abdominal: She exhibits no distension. There is tenderness. There is no rebound.  Musculoskeletal: Normal range of motion. She exhibits no edema.    Lymphadenopathy:    She has no cervical adenopathy.  Neurological: She is oriented to person, place, and time. She exhibits normal muscle tone. Coordination normal.  Skin: No rash noted. No erythema.  Psychiatric: She has a normal mood and affect. Her behavior is normal.     ED Treatments / Results  Labs (all labs ordered are listed, but only abnormal results are displayed) Labs Reviewed  BASIC METABOLIC PANEL - Abnormal; Notable for the following:       Result Value   Potassium 3.4 (*)    Glucose, Bld 129 (*)    BUN 22 (*)    Creatinine, Ser 1.02 (*)    Calcium 8.8 (*)    GFR calc non Af Amer 53 (*)    All other components within normal limits  HEPATIC FUNCTION PANEL - Abnormal; Notable for the following:    Albumin 3.3 (*)    ALT 8 (*)    Indirect Bilirubin 0.2 (*)    All other components within normal limits  I-STAT CHEM 8, ED - Abnormal; Notable for the following:    Potassium 3.4 (*)    BUN 22 (*)    Glucose, Bld 128 (*)    All other components within normal limits  CBC  TROPONIN I  LIPASE, BLOOD    EKG  EKG Interpretation None       Radiology Dg Chest 2 View  Result Date: 02/26/2017 CLINICAL DATA:  Chest pain, left arm pain. Weakness. Near syncope today. EXAM: CHEST  2 VIEW COMPARISON:  Radiographs 01/13/2015 FINDINGS: Again seen hyperinflation. Bronchial thickening was seen previously but has progressed. The heart is normal in size. Probable hiatal hernia. No confluent airspace disease. No definite pleural fluid or pneumothorax. No acute osseous abnormalities are seen. IMPRESSION: Hyperinflation with bronchial thickening. Bronchial thickening has progressed from most recent comparison of 2016, may be progression of chronic disease or acute bronchitis/inflammation. Electronically Signed   By: Jeb Levering M.D.   On: 02/26/2017 19:39   Ct Abdomen Pelvis W Contrast  Result Date: 02/26/2017 CLINICAL DATA:  Epigastric pain radiating to left arm. EXAM: CT  ABDOMEN AND PELVIS WITH CONTRAST TECHNIQUE: Multidetector CT imaging of the abdomen and pelvis was performed using the standard protocol following bolus administration of intravenous contrast. CONTRAST:  16mL ISOVUE-300 IOPAMIDOL (ISOVUE-300) INJECTION 61%, 150mL ISOVUE-300 IOPAMIDOL (ISOVUE-300) INJECTION 61% COMPARISON:  Lumbar spine radiographs from 08/07/2010, abdominal ultrasound from 05/06/2010. FINDINGS: Lower chest: Mitral annular calcifications within a top-normal sized heart. No pericardial effusion. Small hiatal hernia minimal atelectasis at the lung bases. Hepatobiliary: 5 mm right hepatic hypodensity too small to further characterize but base statistically represent a small cyst or hemangioma. No biliary dilatation. No solid enhancing mass lesions. Gallbladder is contracted without calculi. Pancreas: No pancreatic ductal dilatation or mass. No peripancreatic inflammation. Spleen: Normal size spleen. Adrenals/Urinary Tract: No adrenal mass. Symmetric enhancement of both kidneys/nephrograms bilaterally.  Caliectasis of the right renal collecting system with small extrarenal pelvis that opacifies on repeat delayed imaging through the kidneys. No definite source of obstruction is identified. There is a calcification in the right hemiabdomen measuring 9 x 5 x 8 mm along the retroperitoneum. This may represent a calcified lymph node as opposed to an obstructing stone given lack of significant delayed enhancement of the right kidney and opacification of the right renal collecting system proximally upon repeat delayed imaging. This has been present on the prior radiograph from 08/07/2010 of the lumbar spine. Stomach/Bowel: There is moderate-to-marked contrast and fluid distention of the stomach suggesting a component of mild gastroparesis. No bowel obstruction is noted. There is no acute inflammation. Normal-appearing appendix. Vascular/Lymphatic: No aortic aneurysm. Aortoiliac atherosclerosis. No  lymphadenopathy. Reproductive: Pessary noted. Pelvic floor relaxation is seen with partial protrusion of the bladder. A 5 mm left-sided calcification is seen within the uterus consistent with a calcified fibroid. Other: Small fat containing umbilical hernia. No abdominopelvic ascites. No pneumoperitoneum. Musculoskeletal: Grade 1 anterolisthesis of L4 on L5. Degenerative facet arthropathy from L2 through S1. No spondylolysis. SI joint sclerosis bilaterally consistent with osteoarthritis and/or sacroiliitis. 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 IMPRESSION: 1. Marked gastric distention with fluid and contrast suspicious for gastroparesis. No bowel obstruction or acute inflammation. 2. Caliectasis in the right renal collecting system without obstruction. A 9 x 5 x 8 mm calcification in the retroperitoneum of the right hemiabdomen is stable since 2011 and not believed be associated with an obstructing calculus given the symmetric enhancement of both kidneys and prompt bilateral pyelograms demonstrated. 3. 5 mm right hepatic hypodensity too small to further characterize but statistically consistent with a cyst or hemangioma. 4. Pelvic floor relaxation with partial protrusion the bladder as result. Calcified uterine fibroid. 5. Degenerative lumbar facet arthropathy. Grade 1 anterolisthesis of L4 on L5. 6. Sclerotic appearance of both SI joints and pubic symphysis likely related to osteoarthritis. Sacroiliitis is not excluded. Electronically Signed   By: Ashley Royalty M.D.   On: 02/26/2017 22:07    Procedures Procedures (including critical care time)  Medications Ordered in ED Medications  metoCLOPramide (REGLAN) injection 5 mg (not administered)  pantoprazole (PROTONIX) injection 40 mg (40 mg Intravenous Given 02/26/17 2033)  iopamidol (ISOVUE-300) 61 % injection (30 mLs  Contrast Given 02/26/17 2119)  iopamidol (ISOVUE-300) 61 % injection 100 mL (100 mLs Intravenous  Contrast Given 02/26/17 2119)     Initial Impression / Assessment and Plan / ED Course  I have reviewed the triage vital signs and the nursing notes.  Pertinent labs & imaging results that were available during my care of the patient were reviewed by me and considered in my medical decision making (see chart for details).     Patient with distended abdomen. Possible gastroparesis. Patient put on protonic and Reglan and will follow-up with PCP  Final Clinical Impressions(s) / ED Diagnoses   Final diagnoses:  Gastroesophageal reflux disease with esophagitis    New Prescriptions New Prescriptions   METOCLOPRAMIDE (REGLAN) 5 MG TABLET    Take 1 tablet (5 mg total) by mouth 4 (four) times daily -  before meals and at bedtime.   PANTOPRAZOLE (PROTONIX) 20 MG TABLET    Take 1 tablet (20 mg total) by mouth daily.     Milton Ferguson, MD 02/26/17 2256

## 2017-02-28 ENCOUNTER — Encounter (HOSPITAL_COMMUNITY): Payer: Self-pay | Admitting: *Deleted

## 2017-02-28 ENCOUNTER — Emergency Department (HOSPITAL_COMMUNITY): Payer: Medicare Other

## 2017-02-28 ENCOUNTER — Emergency Department (HOSPITAL_COMMUNITY)
Admission: EM | Admit: 2017-02-28 | Discharge: 2017-02-28 | Disposition: A | Discharge status: Home / Self Care | Payer: Medicare Other | Attending: Emergency Medicine | Admitting: Emergency Medicine

## 2017-02-28 DIAGNOSIS — M542 Cervicalgia: Secondary | ICD-10-CM | POA: Diagnosis not present

## 2017-02-28 DIAGNOSIS — R109 Unspecified abdominal pain: Secondary | ICD-10-CM | POA: Insufficient documentation

## 2017-02-28 DIAGNOSIS — R110 Nausea: Secondary | ICD-10-CM | POA: Diagnosis not present

## 2017-02-28 DIAGNOSIS — I10 Essential (primary) hypertension: Secondary | ICD-10-CM | POA: Diagnosis not present

## 2017-02-28 DIAGNOSIS — M25512 Pain in left shoulder: Secondary | ICD-10-CM | POA: Diagnosis not present

## 2017-02-28 DIAGNOSIS — Z79899 Other long term (current) drug therapy: Secondary | ICD-10-CM | POA: Diagnosis not present

## 2017-02-28 DIAGNOSIS — M5410 Radiculopathy, site unspecified: Secondary | ICD-10-CM | POA: Diagnosis not present

## 2017-02-28 DIAGNOSIS — M792 Neuralgia and neuritis, unspecified: Secondary | ICD-10-CM

## 2017-02-28 DIAGNOSIS — M5030 Other cervical disc degeneration, unspecified cervical region: Secondary | ICD-10-CM | POA: Insufficient documentation

## 2017-02-28 LAB — I-STAT TROPONIN, ED: TROPONIN I, POC: 0 ng/mL (ref 0.00–0.08)

## 2017-02-28 MED ORDER — ONDANSETRON 8 MG PO TBDP
8.0000 mg | ORAL_TABLET | Freq: Once | ORAL | Status: AC
Start: 2017-02-28 — End: 2017-02-28
  Administered 2017-02-28: 8 mg via ORAL
  Filled 2017-02-28: qty 1

## 2017-02-28 MED ORDER — ACETAMINOPHEN 500 MG PO TABS
1000.0000 mg | ORAL_TABLET | Freq: Once | ORAL | Status: AC
  Administered 2017-02-28: 1000 mg via ORAL
  Filled 2017-02-28: qty 2

## 2017-02-28 MED ORDER — PREDNISONE 20 MG PO TABS
ORAL_TABLET | ORAL | 0 refills | Status: DC
Start: 2017-02-28 — End: 2017-03-08

## 2017-02-28 NOTE — Discharge Instructions (Signed)
It was our pleasure to provide your ER care today - we hope that you feel better.  Try gentle massage and/or heat therapy to sore area.  Take prednisone as prescribed.   Take tylenol every 4-6 hours as need for pain.  Follow up with primary care doctor in the coming week for recheck.  Return to ER if worse, new symptoms, fevers, arm numbness/weakness, chest pain, other concern.

## 2017-02-28 NOTE — ED Provider Notes (Signed)
[facility] DEPT Provider Note   CSN: [id number] Arrival date & time: 02/28/17  1239     History   Chief Complaint Chief Complaint  Patient presents with  . Abdominal Pain    HPI Kim [name] is a 76 y.o. female.  Patient c/o left neck pain radiating to left shoulder and left arm. Pain constant, mod-severe, worse w certain movements/position changes. No numbness/weakness of arm. Denies any chest pain or discomfort. No sob. +nausea. No vomiting. Was in ED a couple days ago w abd pain, currently denies. No fever or chills. Denies trauma, injury or fall.    The history is provided by the patient.  Abdominal Pain   Associated symptoms include nausea. Pertinent negatives include fever, vomiting and headaches.    Past Medical History:  Diagnosis Date  . Arthritis   . Back spasm   . Hypertension     Patient Active Problem List   Diagnosis Date Noted  . Uterovaginal prolapse, incomplete 08/06/2013  . LOW BACK PAIN 01/09/2010  . BACK PAIN, CHRONIC 01/09/2010  . 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 KNEE SURGERY    . TUBAL LIGATION      OB History    No data available       Home Medications    Prior to Admission medications   Medication Sig Start Date End Date Taking? Authorizing Provider  lidocaine (XYLOCAINE) 5 % ointment Apply 1 application topically daily as needed for mild pain or moderate pain (for knee pain).  02/18/17  Yes Historical Provider, MD  metoCLOPramide (REGLAN) 5 MG tablet Take 1 tablet (5 mg total) by mouth 4 (four) times daily -  before meals and at bedtime. 02/26/17  Yes Milton Ferguson, MD  pantoprazole (PROTONIX) 20 MG tablet Take 1 tablet (20 mg total) by mouth daily. 02/26/17  Yes Milton Ferguson, MD    Family History Family History  Problem Relation Age of Onset  . Heart failure Mother   . Stroke Mother   . Heart failure Father   . Stroke Father   . Hypertension  Sister     Social History Social History  Substance Use Topics  . Smoking status: Never Smoker  . Smokeless tobacco: Never Used  . Alcohol use No     Allergies   Tramadol   Review of Systems Review of Systems  Constitutional: Negative for chills and fever.  HENT: Negative for sore throat.   Eyes: Negative for redness.  Respiratory: Negative for shortness of breath.   Cardiovascular: Negative for chest pain and leg swelling.  Gastrointestinal: Positive for abdominal pain and nausea. Negative for vomiting.  Genitourinary: Negative for flank pain.  Musculoskeletal: Positive for neck pain. Negative for back pain.  Skin: Negative for rash.  Neurological: Negative for weakness, numbness and headaches.  Hematological: Does not bruise/bleed easily.  Psychiatric/Behavioral: Negative for confusion.     Physical Exam Updated Vital Signs BP 156/76   Pulse 84   Temp 98.6 F (37 C) (Oral)   Resp 20   Ht 5\' 2"  (1.575 m)   Wt 83 kg   SpO2 99%   BMI 33.47 kg/m   Physical Exam  Constitutional: She appears well-developed and well-nourished. No distress.  HENT:  Mouth/Throat: Oropharynx is clear and moist.  Eyes: Conjunctivae are normal. No scleral icterus.  Neck: Neck supple. No tracheal deviation present.  No bruits. No neck mass.   Cardiovascular: Normal rate, regular rhythm, normal  heart sounds and intact distal pulses.  Exam reveals no gallop and no friction rub.   No murmur heard. Pulmonary/Chest: Effort normal. No respiratory distress.  Abdominal: Soft. Normal appearance. She exhibits no distension. There is no tenderness.  Genitourinary:  Genitourinary Comments: No cva tenderness  Musculoskeletal: She exhibits no edema.  Left neck and left trap tenderness. Passive rom left shoulder without pain. Radial pulse 2+.   Neurological: She is alert.  LUE motor intact, stre 5/5. sens grossly intact.  Skin: Skin is warm and dry. No rash noted. She is not diaphoretic.    Psychiatric: She has a normal mood and affect.  Nursing note and vitals reviewed.    ED Treatments / Results  Labs (all labs ordered are listed, but only abnormal results are displayed) Results for orders placed or performed during the hospital encounter of 02/28/17  I-stat troponin, ED  Result Value Ref Range   Troponin i, poc 0.00 0.00 - 0.08 ng/mL   Comment 3             Ct Cervical Spine Wo Contrast  Result Date: 02/28/2017 CLINICAL DATA:  Left neck and upper extremity pain. EXAM: CT CERVICAL SPINE WITHOUT CONTRAST TECHNIQUE: Multidetector CT imaging of the cervical spine was performed without intravenous contrast. Multiplanar CT image reconstructions were also generated. COMPARISON:  CT scan of September 16, 2015. FINDINGS: Alignment: Normal. Skull base and vertebrae: No acute fracture. No primary bone lesion or focal pathologic process. Soft tissues and spinal canal: No prevertebral fluid or swelling. No visible canal hematoma. Disc levels: Mild degenerative disc disease is noted at C4-5, C5-6 and C6-7. Upper chest: Negative. Other: Mild degenerative changes seen involving the posterior facet joints. IMPRESSION: Mild multilevel degenerative disc disease. No acute abnormality seen in the cervical spine. Electronically Signed   By: Marijo Conception, M.D.   On: 02/28/2017 14:59   EKG  EKG Interpretation  Date/Time:  Thursday February 28 2017 14:13:17 EST Ventricular Rate:  81 PR Interval:    QRS Duration: 110 QT Interval:  368 QTC Calculation: 428 R Axis:   -55 Text Interpretation:  Sinus rhythm Multiple ventricular premature complexes No significant change since last tracing Confirmed by Ashok Cordia  MD, Lennette Bihari ([id number]) on 02/28/2017 2:24:42 PM       Radiology  Procedures Procedures (including critical care time)  Medications Ordered in ED Medications  acetaminophen (TYLENOL) tablet 1,000 mg (not administered)     Initial Impression / Assessment and Plan / ED Course  I have  reviewed the triage vital signs and the nursing notes.  Pertinent labs & imaging results that were available during my care of the patient were reviewed by me and considered in my medical decision making (see chart for details).  Labs. Ecg.   With radicular pain, will get imaging study neck.   Reviewed nursing notes and prior charts for additional history.  Recent ed eval for abd pain, ct abd then neg for acute process.   After symptoms constant, persistent for past couple days, trop neg.  Recent abd pain workup was neg acute.   Ct neck w deg changes.  ?radicular arm pain.  Pt may also have chr deg changes in shoulder/rotator cuff also contributing to pain.  Tylenol in ED.   Given radicular pain. Will try course pred.    Final Clinical Impressions(s) / ED Diagnoses   Final diagnoses:  None    New Prescriptions New Prescriptions   No medications on file     Lennette Bihari  Ashok Cordia, MD 02/28/17 [phone]

## 2017-02-28 NOTE — ED Triage Notes (Signed)
Abdominal pain with nausea and vomiting, also has pain in left arm, seen 2 days ago for same,

## 2017-03-04 ENCOUNTER — Ambulatory Visit: Payer: Medicare Other | Admitting: Obstetrics & Gynecology

## 2017-03-05 DIAGNOSIS — M19072 Primary osteoarthritis, left ankle and foot: Secondary | ICD-10-CM | POA: Diagnosis not present

## 2017-03-05 DIAGNOSIS — M79641 Pain in right hand: Secondary | ICD-10-CM | POA: Diagnosis not present

## 2017-03-05 DIAGNOSIS — M79642 Pain in left hand: Secondary | ICD-10-CM | POA: Diagnosis not present

## 2017-03-05 DIAGNOSIS — M1712 Unilateral primary osteoarthritis, left knee: Secondary | ICD-10-CM | POA: Diagnosis not present

## 2017-03-05 DIAGNOSIS — M1711 Unilateral primary osteoarthritis, right knee: Secondary | ICD-10-CM | POA: Diagnosis not present

## 2017-03-05 DIAGNOSIS — M25569 Pain in unspecified knee: Secondary | ICD-10-CM | POA: Diagnosis not present

## 2017-03-05 DIAGNOSIS — M79643 Pain in unspecified hand: Secondary | ICD-10-CM | POA: Diagnosis not present

## 2017-03-05 DIAGNOSIS — M7989 Other specified soft tissue disorders: Secondary | ICD-10-CM | POA: Diagnosis not present

## 2017-03-05 DIAGNOSIS — M19071 Primary osteoarthritis, right ankle and foot: Secondary | ICD-10-CM | POA: Diagnosis not present

## 2017-03-05 DIAGNOSIS — M25512 Pain in left shoulder: Secondary | ICD-10-CM | POA: Diagnosis not present

## 2017-03-05 DIAGNOSIS — M25469 Effusion, unspecified knee: Secondary | ICD-10-CM | POA: Diagnosis not present

## 2017-03-08 ENCOUNTER — Ambulatory Visit (INDEPENDENT_AMBULATORY_CARE_PROVIDER_SITE_OTHER): Payer: Medicare Other | Admitting: Obstetrics & Gynecology

## 2017-03-08 ENCOUNTER — Encounter: Payer: Self-pay | Admitting: Obstetrics & Gynecology

## 2017-03-08 VITALS — BP 120/70 | HR 82 | Ht 62.0 in | Wt 180.5 lb

## 2017-03-08 DIAGNOSIS — Z4689 Encounter for fitting and adjustment of other specified devices: Secondary | ICD-10-CM

## 2017-03-08 DIAGNOSIS — N812 Incomplete uterovaginal prolapse: Secondary | ICD-10-CM

## 2017-03-08 NOTE — Progress Notes (Signed)
Patient ID: Kim [name], female   DOB: 08-10-1941, 76 y.o.   MRN: [id number] Patient ID: [name], female   DOB: 1941-08-06, 76 y.o.   MRN: [id number] Chief Complaint  Patient presents with  . pessary cleaning    stomach pain    Blood pressure 120/70, pulse 82, height 5\' 2"  (1.575 m), weight 180 lb 8 oz (81.9 kg).  [name] presents today for routine follow up related to her pessary.   She uses a milex ring with support #6 She reports no vaginal discharge or vaginal bleeding.  Exam reveals no undue vaginal mucosal pressure of breakdown, no discharge and no vaginal bleeding.  The pessary is removed, cleaned and replaced without difficulty.    [name] will be sen back in 4 months for continued follow up.  Florian Buff, MD   03/08/2017 1:25 PM

## 2017-03-15 DIAGNOSIS — M79643 Pain in unspecified hand: Secondary | ICD-10-CM | POA: Diagnosis not present

## 2017-03-15 DIAGNOSIS — M25469 Effusion, unspecified knee: Secondary | ICD-10-CM | POA: Diagnosis not present

## 2017-03-15 DIAGNOSIS — M25569 Pain in unspecified knee: Secondary | ICD-10-CM | POA: Diagnosis not present

## 2017-03-15 DIAGNOSIS — M25512 Pain in left shoulder: Secondary | ICD-10-CM | POA: Diagnosis not present

## 2017-04-12 DIAGNOSIS — M79643 Pain in unspecified hand: Secondary | ICD-10-CM | POA: Diagnosis not present

## 2017-04-12 DIAGNOSIS — M25569 Pain in unspecified knee: Secondary | ICD-10-CM | POA: Diagnosis not present

## 2017-04-12 DIAGNOSIS — M1990 Unspecified osteoarthritis, unspecified site: Secondary | ICD-10-CM | POA: Diagnosis not present

## 2017-04-12 DIAGNOSIS — M0570 Rheumatoid arthritis with rheumatoid factor of unspecified site without organ or systems involvement: Secondary | ICD-10-CM | POA: Diagnosis not present

## 2017-05-10 DIAGNOSIS — M1990 Unspecified osteoarthritis, unspecified site: Secondary | ICD-10-CM | POA: Diagnosis not present

## 2017-05-10 DIAGNOSIS — M25569 Pain in unspecified knee: Secondary | ICD-10-CM | POA: Diagnosis not present

## 2017-05-10 DIAGNOSIS — M0570 Rheumatoid arthritis with rheumatoid factor of unspecified site without organ or systems involvement: Secondary | ICD-10-CM | POA: Diagnosis not present

## 2017-05-10 DIAGNOSIS — M79643 Pain in unspecified hand: Secondary | ICD-10-CM | POA: Diagnosis not present

## 2017-05-23 DIAGNOSIS — I10 Essential (primary) hypertension: Secondary | ICD-10-CM | POA: Diagnosis not present

## 2017-05-23 DIAGNOSIS — S46912A Strain of unspecified muscle, fascia and tendon at shoulder and upper arm level, left arm, initial encounter: Secondary | ICD-10-CM | POA: Diagnosis not present

## 2017-05-23 DIAGNOSIS — R0789 Other chest pain: Secondary | ICD-10-CM | POA: Diagnosis not present

## 2017-05-29 DIAGNOSIS — R079 Chest pain, unspecified: Secondary | ICD-10-CM | POA: Diagnosis not present

## 2017-06-03 ENCOUNTER — Other Ambulatory Visit (HOSPITAL_COMMUNITY): Payer: Self-pay | Admitting: Internal Medicine

## 2017-06-03 ENCOUNTER — Ambulatory Visit (HOSPITAL_COMMUNITY)
Admission: RE | Admit: 2017-06-03 | Discharge: 2017-06-03 | Disposition: A | Discharge status: Home / Self Care | Payer: Medicare Other | Source: Ambulatory Visit | Attending: Internal Medicine | Admitting: Internal Medicine

## 2017-06-03 DIAGNOSIS — R079 Chest pain, unspecified: Secondary | ICD-10-CM

## 2017-06-12 DIAGNOSIS — I10 Essential (primary) hypertension: Secondary | ICD-10-CM | POA: Diagnosis not present

## 2017-06-12 DIAGNOSIS — R600 Localized edema: Secondary | ICD-10-CM | POA: Diagnosis not present

## 2017-06-12 DIAGNOSIS — R0600 Dyspnea, unspecified: Secondary | ICD-10-CM | POA: Diagnosis not present

## 2017-06-24 ENCOUNTER — Other Ambulatory Visit (HOSPITAL_COMMUNITY): Payer: Self-pay | Admitting: Internal Medicine

## 2017-06-24 DIAGNOSIS — Z1231 Encounter for screening mammogram for malignant neoplasm of breast: Secondary | ICD-10-CM

## 2017-06-27 ENCOUNTER — Ambulatory Visit (HOSPITAL_COMMUNITY)
Admission: RE | Admit: 2017-06-27 | Discharge: 2017-06-27 | Disposition: A | Discharge status: Home / Self Care | Payer: Medicare Other | Source: Ambulatory Visit | Attending: Internal Medicine | Admitting: Internal Medicine

## 2017-06-27 DIAGNOSIS — Z1231 Encounter for screening mammogram for malignant neoplasm of breast: Secondary | ICD-10-CM | POA: Diagnosis not present

## 2017-07-08 ENCOUNTER — Ambulatory Visit: Payer: Medicare Other | Admitting: Obstetrics & Gynecology

## 2017-07-10 DIAGNOSIS — M170 Bilateral primary osteoarthritis of knee: Secondary | ICD-10-CM | POA: Diagnosis not present

## 2017-07-10 DIAGNOSIS — M25512 Pain in left shoulder: Secondary | ICD-10-CM | POA: Diagnosis not present

## 2017-07-10 DIAGNOSIS — M25569 Pain in unspecified knee: Secondary | ICD-10-CM | POA: Diagnosis not present

## 2017-07-10 DIAGNOSIS — M1990 Unspecified osteoarthritis, unspecified site: Secondary | ICD-10-CM | POA: Diagnosis not present

## 2017-07-10 DIAGNOSIS — M0570 Rheumatoid arthritis with rheumatoid factor of unspecified site without organ or systems involvement: Secondary | ICD-10-CM | POA: Diagnosis not present

## 2017-07-10 DIAGNOSIS — M79643 Pain in unspecified hand: Secondary | ICD-10-CM | POA: Diagnosis not present

## 2017-07-16 ENCOUNTER — Ambulatory Visit (INDEPENDENT_AMBULATORY_CARE_PROVIDER_SITE_OTHER): Payer: Medicare Other | Admitting: Obstetrics & Gynecology

## 2017-07-16 ENCOUNTER — Encounter: Payer: Self-pay | Admitting: Obstetrics & Gynecology

## 2017-07-16 VITALS — BP 140/88 | HR 78 | Wt 185.0 lb

## 2017-07-16 DIAGNOSIS — Z4689 Encounter for fitting and adjustment of other specified devices: Secondary | ICD-10-CM

## 2017-07-16 DIAGNOSIS — N812 Incomplete uterovaginal prolapse: Secondary | ICD-10-CM

## 2017-07-16 NOTE — Progress Notes (Signed)
Chief Complaint  Patient presents with  . Follow-up    pessary   Blood pressure 140/88, pulse 78, weight 185 lb (83.9 kg).  Alzora Ha presents today for routine follow-up related to her pessary She is using a Milex ring with support #6 and has for many many years She denies any vaginal discharge or vaginal bleeding and no odor  Examination today reveals no mucosal pressure damage or vaginal mucosal breakdown no discharge no vaginal bleeding exam is completely normal  The pessary is removed clean replaced without difficulty  Atiya Yera we seen back in 4 months for continued follow-up related to her pessary  Florian Buff, MD 07/16/2017 12:22 PM

## 2017-07-17 DIAGNOSIS — R0600 Dyspnea, unspecified: Secondary | ICD-10-CM | POA: Diagnosis not present

## 2017-07-17 DIAGNOSIS — Z960 Presence of urogenital implants: Secondary | ICD-10-CM | POA: Diagnosis not present

## 2017-07-17 DIAGNOSIS — I10 Essential (primary) hypertension: Secondary | ICD-10-CM | POA: Diagnosis not present

## 2017-07-17 DIAGNOSIS — M069 Rheumatoid arthritis, unspecified: Secondary | ICD-10-CM | POA: Diagnosis not present

## 2017-07-25 DIAGNOSIS — Z0001 Encounter for general adult medical examination with abnormal findings: Secondary | ICD-10-CM | POA: Diagnosis not present

## 2017-07-25 DIAGNOSIS — I10 Essential (primary) hypertension: Secondary | ICD-10-CM | POA: Diagnosis not present

## 2017-07-25 DIAGNOSIS — M05719 Rheumatoid arthritis with rheumatoid factor of unspecified shoulder without organ or systems involvement: Secondary | ICD-10-CM | POA: Diagnosis not present

## 2017-07-25 DIAGNOSIS — Z79899 Other long term (current) drug therapy: Secondary | ICD-10-CM | POA: Diagnosis not present

## 2017-07-31 DIAGNOSIS — E559 Vitamin D deficiency, unspecified: Secondary | ICD-10-CM | POA: Diagnosis not present

## 2017-07-31 DIAGNOSIS — Z0001 Encounter for general adult medical examination with abnormal findings: Secondary | ICD-10-CM | POA: Diagnosis not present

## 2017-07-31 DIAGNOSIS — R7303 Prediabetes: Secondary | ICD-10-CM | POA: Diagnosis not present

## 2017-07-31 DIAGNOSIS — Z23 Encounter for immunization: Secondary | ICD-10-CM | POA: Diagnosis not present

## 2017-07-31 DIAGNOSIS — Z79899 Other long term (current) drug therapy: Secondary | ICD-10-CM | POA: Diagnosis not present

## 2017-07-31 DIAGNOSIS — K5900 Constipation, unspecified: Secondary | ICD-10-CM | POA: Diagnosis not present

## 2017-08-07 DIAGNOSIS — M0570 Rheumatoid arthritis with rheumatoid factor of unspecified site without organ or systems involvement: Secondary | ICD-10-CM | POA: Diagnosis not present

## 2017-08-07 DIAGNOSIS — M79643 Pain in unspecified hand: Secondary | ICD-10-CM | POA: Diagnosis not present

## 2017-08-07 DIAGNOSIS — M1990 Unspecified osteoarthritis, unspecified site: Secondary | ICD-10-CM | POA: Diagnosis not present

## 2017-08-07 DIAGNOSIS — M170 Bilateral primary osteoarthritis of knee: Secondary | ICD-10-CM | POA: Diagnosis not present

## 2017-11-08 DIAGNOSIS — I10 Essential (primary) hypertension: Secondary | ICD-10-CM | POA: Diagnosis not present

## 2017-11-08 DIAGNOSIS — E559 Vitamin D deficiency, unspecified: Secondary | ICD-10-CM | POA: Diagnosis not present

## 2017-11-08 DIAGNOSIS — R739 Hyperglycemia, unspecified: Secondary | ICD-10-CM | POA: Diagnosis not present

## 2017-11-08 DIAGNOSIS — K5900 Constipation, unspecified: Secondary | ICD-10-CM | POA: Diagnosis not present

## 2017-11-08 DIAGNOSIS — M069 Rheumatoid arthritis, unspecified: Secondary | ICD-10-CM | POA: Diagnosis not present

## 2017-11-13 DIAGNOSIS — R7303 Prediabetes: Secondary | ICD-10-CM | POA: Diagnosis not present

## 2017-11-13 DIAGNOSIS — I10 Essential (primary) hypertension: Secondary | ICD-10-CM | POA: Diagnosis not present

## 2017-11-13 DIAGNOSIS — E559 Vitamin D deficiency, unspecified: Secondary | ICD-10-CM | POA: Diagnosis not present

## 2017-11-15 ENCOUNTER — Ambulatory Visit: Payer: Medicare Other | Admitting: Obstetrics & Gynecology

## 2017-11-20 DIAGNOSIS — G4700 Insomnia, unspecified: Secondary | ICD-10-CM | POA: Diagnosis not present

## 2017-11-20 DIAGNOSIS — R739 Hyperglycemia, unspecified: Secondary | ICD-10-CM | POA: Diagnosis not present

## 2017-11-20 DIAGNOSIS — K5900 Constipation, unspecified: Secondary | ICD-10-CM | POA: Diagnosis not present

## 2017-11-20 DIAGNOSIS — Z79899 Other long term (current) drug therapy: Secondary | ICD-10-CM | POA: Diagnosis not present

## 2017-11-20 DIAGNOSIS — Z23 Encounter for immunization: Secondary | ICD-10-CM | POA: Diagnosis not present

## 2017-11-20 DIAGNOSIS — E559 Vitamin D deficiency, unspecified: Secondary | ICD-10-CM | POA: Diagnosis not present

## 2017-11-29 ENCOUNTER — Ambulatory Visit (INDEPENDENT_AMBULATORY_CARE_PROVIDER_SITE_OTHER): Payer: Medicare Other | Admitting: Obstetrics & Gynecology

## 2017-11-29 ENCOUNTER — Other Ambulatory Visit: Payer: Self-pay

## 2017-11-29 ENCOUNTER — Encounter: Payer: Self-pay | Admitting: Obstetrics & Gynecology

## 2017-11-29 VITALS — BP 132/82 | HR 97 | Ht 63.0 in | Wt 192.0 lb

## 2017-11-29 DIAGNOSIS — Z4689 Encounter for fitting and adjustment of other specified devices: Secondary | ICD-10-CM | POA: Diagnosis not present

## 2017-11-29 DIAGNOSIS — N812 Incomplete uterovaginal prolapse: Secondary | ICD-10-CM | POA: Diagnosis not present

## 2017-11-29 NOTE — Progress Notes (Signed)
   Patient ID: Abi Shoults, female   DOB: 1941/05/13, 75 y.o.   MRN: [id number]  Blood pressure 132/82, pulse 97, height 5\' 3"  (1.6 m), weight 192 lb (87.1 kg).  Judiann Celia is in today for routine follow-up related to her pessary She uses a Milex ring with support #6 and has for many years without problems Today she reports no vaginal discharge and no vaginal bleeding  Exam She has normal external genitalia The pessary is properly seated in the vagina There are no mucosal irritation or breakdown noted The pessary is removed and cleaned with warm border and so and replaced in the vagina without difficulty again with good proper seating  Vincenta Steffey will be seen back in approximately 4 months for ongoing maintenance of her pessary She is encouraged to call prior to that time should she have any problems                  Return in about 4 months (around 03/29/2018) for Follow up, with Dr Elonda Husky.

## 2017-12-11 DIAGNOSIS — I10 Essential (primary) hypertension: Secondary | ICD-10-CM | POA: Diagnosis not present

## 2017-12-11 DIAGNOSIS — Z79899 Other long term (current) drug therapy: Secondary | ICD-10-CM | POA: Diagnosis not present

## 2017-12-11 DIAGNOSIS — R600 Localized edema: Secondary | ICD-10-CM | POA: Diagnosis not present

## 2017-12-11 DIAGNOSIS — M069 Rheumatoid arthritis, unspecified: Secondary | ICD-10-CM | POA: Diagnosis not present

## 2018-02-12 DIAGNOSIS — K219 Gastro-esophageal reflux disease without esophagitis: Secondary | ICD-10-CM | POA: Diagnosis not present

## 2018-02-12 DIAGNOSIS — K529 Noninfective gastroenteritis and colitis, unspecified: Secondary | ICD-10-CM | POA: Diagnosis not present

## 2018-03-05 ENCOUNTER — Emergency Department (HOSPITAL_COMMUNITY): Payer: Medicare Other

## 2018-03-05 ENCOUNTER — Other Ambulatory Visit: Payer: Self-pay

## 2018-03-05 ENCOUNTER — Emergency Department (HOSPITAL_COMMUNITY)
Admission: EM | Admit: 2018-03-05 | Discharge: 2018-03-05 | Disposition: A | Discharge status: Home / Self Care | Payer: Medicare Other | Attending: Emergency Medicine | Admitting: Emergency Medicine

## 2018-03-05 ENCOUNTER — Encounter (HOSPITAL_COMMUNITY): Payer: Self-pay | Admitting: Emergency Medicine

## 2018-03-05 DIAGNOSIS — R0609 Other forms of dyspnea: Secondary | ICD-10-CM | POA: Insufficient documentation

## 2018-03-05 DIAGNOSIS — R600 Localized edema: Secondary | ICD-10-CM | POA: Diagnosis not present

## 2018-03-05 DIAGNOSIS — I10 Essential (primary) hypertension: Secondary | ICD-10-CM | POA: Diagnosis not present

## 2018-03-05 DIAGNOSIS — R0600 Dyspnea, unspecified: Secondary | ICD-10-CM | POA: Diagnosis not present

## 2018-03-05 DIAGNOSIS — R609 Edema, unspecified: Secondary | ICD-10-CM

## 2018-03-05 DIAGNOSIS — R0602 Shortness of breath: Secondary | ICD-10-CM | POA: Diagnosis not present

## 2018-03-05 LAB — BASIC METABOLIC PANEL
ANION GAP: 9 (ref 5–15)
BUN: 18 mg/dL (ref 6–20)
CO2: 25 mmol/L (ref 22–32)
Calcium: 8.6 mg/dL — ABNORMAL LOW (ref 8.9–10.3)
Chloride: 100 mmol/L — ABNORMAL LOW (ref 101–111)
Creatinine, Ser: 0.87 mg/dL (ref 0.44–1.00)
Glucose, Bld: 97 mg/dL (ref 65–99)
POTASSIUM: 3.4 mmol/L — AB (ref 3.5–5.1)
SODIUM: 134 mmol/L — AB (ref 135–145)

## 2018-03-05 LAB — CBC
HEMATOCRIT: 42.2 % (ref 36.0–46.0)
HEMOGLOBIN: 12.9 g/dL (ref 12.0–15.0)
MCH: 29.4 pg (ref 26.0–34.0)
MCHC: 30.6 g/dL (ref 30.0–36.0)
MCV: 96.1 fL (ref 78.0–100.0)
Platelets: 289 10*3/uL (ref 150–400)
RBC: 4.39 MIL/uL (ref 3.87–5.11)
RDW: 15.4 % (ref 11.5–15.5)
WBC: 6 10*3/uL (ref 4.0–10.5)

## 2018-03-05 LAB — BRAIN NATRIURETIC PEPTIDE: B NATRIURETIC PEPTIDE 5: 49 pg/mL (ref 0.0–100.0)

## 2018-03-05 LAB — TROPONIN I

## 2018-03-05 MED ORDER — ALBUTEROL SULFATE HFA 108 (90 BASE) MCG/ACT IN AERS: 1.0000 | INHALATION_SPRAY | Freq: Four times a day (QID) | RESPIRATORY_TRACT | 0 refills | Status: DC | PRN

## 2018-03-05 NOTE — ED Triage Notes (Signed)
Pt c/o bilateral lower extremity edema and dyspnea with exertion x 2 weeks. Denies hx of CHF.

## 2018-03-05 NOTE — ED Provider Notes (Signed)
Emergency Department Provider Note   I have reviewed the triage vital signs and the nursing notes.   HISTORY  Chief Complaint Leg Swelling   HPI Kim [name] is a 77 y.o. female with PMH of mild diastolic dysfunction with maintained EF (ECHO 2016), RA, HTN, and arthritis presents to the emergency department with several months of intermittent exertional dyspnea and new onset bilateral lower extremity edema.  The patient's dyspnea is mostly when she is walking to her mailbox.  She spoke with her PCP regarding this and was provided an inhaler which does improve her symptoms when she uses it.  She has noticed swelling in the legs, bilaterally, and over the past 1-2 weeks.  Denies any unilateral leg swelling or redness.  No fevers or chills.  No injuries.  She states at night she has pain that radiates from the knee to the ankle but this improves slightly after taking aspirin.  Patient does not take Lasix.  Denies any chest pain with or dyspnea.  No palpitations or lightheadedness.   Past Medical History:  Diagnosis Date  . Acid reflux   . Arthritis   . Back spasm   . Hypertension   . Rheumatoid arthritis [facility])     Patient Active Problem List   Diagnosis Date Noted  . Uterovaginal prolapse, incomplete 08/06/2013  . LOW BACK PAIN 01/09/2010  . BACK PAIN, CHRONIC 01/09/2010  . 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 KNEE SURGERY    . TUBAL LIGATION      Current Outpatient Rx  . Order #: [id number] Class: Historical Med  . Order #: [id number] Class: Print  . Order #: [id number] Class: Historical Med  . Order #: [id number] Class: Historical Med  . Order #: [id number] Class: Historical Med  . Order #: [id number] Class: Historical Med  . Order #: [id number] Class: Historical Med  . Order #: [id number] Class: Historical Med    Allergies Tramadol  Family History  Problem Relation Age of Onset  . Heart failure  Mother   . Stroke Mother   . Heart failure Father   . Stroke Father   . Hypertension Sister     Social History Social History   Tobacco Use  . Smoking status: Never Smoker  . Smokeless tobacco: Never Used  Substance Use Topics  . Alcohol use: No  . Drug use: No    Review of Systems  Constitutional: No fever/chills Eyes: No visual changes. ENT: No sore throat. Cardiovascular: Denies chest pain. Respiratory: Positive exertional shortness of breath x months.  Gastrointestinal: No abdominal pain.  No nausea, no vomiting.  No diarrhea.  No constipation. Genitourinary: Negative for dysuria. Musculoskeletal: Negative for back pain. Positive B/L LE edema.  Skin: Negative for rash. Neurological: Negative for headaches, focal weakness or numbness.  10-point ROS otherwise negative.  ____________________________________________   PHYSICAL EXAM:  VITAL SIGNS: ED Triage Vitals  Enc Vitals Group     BP 03/05/18 1153 (!) 137/95     Pulse Rate 03/05/18 1153 78     Resp 03/05/18 1518 16     Temp 03/05/18 1153 98.6 F (37 C)     Temp Source 03/05/18 1153 Oral     SpO2 03/05/18 1153 100 %     Weight 03/05/18 1153 185 lb (83.9 kg)     Height 03/05/18 1153 5\' 2"  (1.575 m)     Pain Score 03/05/18 1153 8   Constitutional: Alert and oriented. Well appearing and in no  acute distress. Eyes: Conjunctivae are normal.  Head: Atraumatic. Nose: No congestion/rhinnorhea. Mouth/Throat: Mucous membranes are moist.  Neck: No stridor.  Cardiovascular: Normal rate, regular rhythm. Good peripheral circulation. Grossly normal heart sounds.   Respiratory: Normal respiratory effort.  No retractions. Lungs CTAB. Gastrointestinal: Soft and nontender. No distention.  Musculoskeletal: No lower extremity tenderness with trace B/L LE edema. No gross deformities of extremities. Neurologic:  Normal speech and language. No gross focal neurologic deficits are appreciated.  Skin:  Skin is warm, dry and  intact. No rash noted.  ____________________________________________   LABS (all labs ordered are listed, but only abnormal results are displayed)  Labs Reviewed  BASIC METABOLIC PANEL - Abnormal; Notable for the following components:      Result Value   Sodium 134 (*)    Potassium 3.4 (*)    Chloride 100 (*)    Calcium 8.6 (*)    All other components within normal limits  CBC  TROPONIN I  BRAIN NATRIURETIC PEPTIDE   ____________________________________________  EKG   EKG Interpretation  Date/Time:  Wednesday March 05 2018 11:58:53 EST Ventricular Rate:  105 PR Interval:  156 QRS Duration: 106 QT Interval:  360 QTC Calculation: 475 R Axis:   -62 Text Interpretation:  Sinus tachycardia with occasional Premature ventricular complexes Left anterior fascicular block Minimal voltage criteria for LVH, may be normal variant Septal infarct , age undetermined Abnormal ECG Confirmed by Fredia Sorrow [phone]) on 03/05/2018 12:15:46 PM       ____________________________________________  RADIOLOGY  Dg Chest 2 View  Result Date: 03/05/2018 CLINICAL DATA:  Shortness of breath and bilateral lower extremity swelling. EXAM: CHEST - 2 VIEW COMPARISON:  PA and lateral chest 06/03/2017, 02/26/2017 and 01/13/2015. FINDINGS: Mild prominence of the pulmonary interstitium throughout is chronic. No consolidative process, edema, pneumothorax or effusion. Heart size is upper normal. Aortic atherosclerosis noted. IMPRESSION: No acute disease. Electronically Signed   By: Inge Rise M.D.   On: 03/05/2018 12:27    ____________________________________________   PROCEDURES  Procedure(s) performed:   Procedures  None ____________________________________________   INITIAL IMPRESSION / ASSESSMENT AND PLAN / ED COURSE  Pertinent labs & imaging results that were available during my care of the patient were reviewed by me and considered in my medical decision making (see chart for  details).  Patient presents to the emergency department for evaluation of exertional dyspnea that is been intermittent over the past several months.  Is not worsening significantly recently and is improved by inhaler usage.  She came to the ED today with some bilateral lower extremity edema.  She has trace edema on my exam.  Normal-appearing joints with no erythema or concern for septic arthritis.  No unilateral swelling to suggest DVT.  Patient is not short of breath on my evaluation.  Her oxygen saturation is 100%.  Labs performed from triage show normal BNP, troponin, and chemistry.  I reviewed the chest x-ray which shows no evidence of pulmonary edema.  Not clinically volume overloaded.  I spent time counseling the patient about elevating her legs and using compression hose.  Plan to refill her albuterol inhaler.  She will call her PCP today/tomorrow to schedule a follow-up appointment for the symptoms.  Plan to also provide contact information for a local cardiologist for consideration of repeat echo.   At this time, I do not feel there is any life-threatening condition present. I have reviewed and discussed all results (EKG, imaging, lab, urine as appropriate), exam findings with patient. I have  reviewed nursing notes and appropriate previous records.  I feel the patient is safe to be discharged home without further emergent workup. Discussed usual and customary return precautions. Patient and family (if present) verbalize understanding and are comfortable with this plan.  Patient will follow-up with their primary care provider. If they do not have a primary care provider, information for follow-up has been provided to them. All questions have been answered.    ____________________________________________  FINAL CLINICAL IMPRESSION(S) / ED DIAGNOSES  Final diagnoses:  Peripheral edema  Exertional dyspnea    NEW OUTPATIENT MEDICATIONS STARTED DURING THIS VISIT:  New Prescriptions    ALBUTEROL (PROVENTIL HFA;VENTOLIN HFA) 108 (90 BASE) MCG/ACT INHALER    Inhale 1-2 puffs into the lungs every 6 (six) hours as needed for wheezing or shortness of breath.    Note:  This document was prepared using Dragon voice recognition software and may include unintentional dictation errors.  Nanda Quinton, MD Emergency Medicine    Nelson Julson, [facility], MD 03/05/18 [phone]

## 2018-03-05 NOTE — Discharge Instructions (Signed)
You were seen in the ED today with mild swelling in the legs and shortness of breath with walking. Elevated your legs when seated at home and use compression stockings to decrease swelling. Call your PCP and Cardiologist today to schedule follow up appointments.   Return to the ED with any new or worsening symptoms.

## 2018-03-12 DIAGNOSIS — L239 Allergic contact dermatitis, unspecified cause: Secondary | ICD-10-CM | POA: Diagnosis not present

## 2018-03-12 DIAGNOSIS — R600 Localized edema: Secondary | ICD-10-CM | POA: Diagnosis not present

## 2018-03-12 DIAGNOSIS — K219 Gastro-esophageal reflux disease without esophagitis: Secondary | ICD-10-CM | POA: Diagnosis not present

## 2018-03-25 DIAGNOSIS — R600 Localized edema: Secondary | ICD-10-CM | POA: Diagnosis not present

## 2018-03-25 DIAGNOSIS — G2581 Restless legs syndrome: Secondary | ICD-10-CM | POA: Diagnosis not present

## 2018-03-25 DIAGNOSIS — Z79899 Other long term (current) drug therapy: Secondary | ICD-10-CM | POA: Diagnosis not present

## 2018-03-25 DIAGNOSIS — I10 Essential (primary) hypertension: Secondary | ICD-10-CM | POA: Diagnosis not present

## 2018-03-28 ENCOUNTER — Ambulatory Visit: Payer: Medicare Other | Admitting: Obstetrics & Gynecology

## 2018-04-01 DIAGNOSIS — Z1389 Encounter for screening for other disorder: Secondary | ICD-10-CM | POA: Diagnosis not present

## 2018-04-01 DIAGNOSIS — M25561 Pain in right knee: Secondary | ICD-10-CM | POA: Diagnosis not present

## 2018-04-01 DIAGNOSIS — M25562 Pain in left knee: Secondary | ICD-10-CM | POA: Diagnosis not present

## 2018-04-01 DIAGNOSIS — I10 Essential (primary) hypertension: Secondary | ICD-10-CM | POA: Diagnosis not present

## 2018-04-01 DIAGNOSIS — Z79899 Other long term (current) drug therapy: Secondary | ICD-10-CM | POA: Diagnosis not present

## 2018-04-10 ENCOUNTER — Encounter: Payer: Self-pay | Admitting: Obstetrics & Gynecology

## 2018-04-10 ENCOUNTER — Ambulatory Visit (INDEPENDENT_AMBULATORY_CARE_PROVIDER_SITE_OTHER): Payer: Medicare Other | Admitting: Obstetrics & Gynecology

## 2018-04-10 ENCOUNTER — Other Ambulatory Visit: Payer: Self-pay

## 2018-04-10 VITALS — BP 134/86 | HR 85 | Ht 62.0 in | Wt 184.0 lb

## 2018-04-10 DIAGNOSIS — N812 Incomplete uterovaginal prolapse: Secondary | ICD-10-CM

## 2018-04-10 DIAGNOSIS — Z4689 Encounter for fitting and adjustment of other specified devices: Secondary | ICD-10-CM

## 2018-04-10 NOTE — Progress Notes (Signed)
Chief Complaint  Patient presents with  . pessary maintanence    Blood pressure 134/86, pulse 85, height 5\' 2"  (1.575 m), weight 184 lb (83.5 kg).  Kim [name] presents today for routine follow up related to her pessary.   She uses a milex ring with support #6 She reports no vaginal discharge or vaginal bleeding.  Exam reveals no undue vaginal mucosal pressure of breakdown, no discharge and no vaginal bleeding.  The pessary is removed, cleaned and replaced without difficulty.    Kim [name] will be sen back in 4 months for continued follow up.  Florian Buff, MD  04/10/2018 12:12 PM

## 2018-05-19 DIAGNOSIS — G2581 Restless legs syndrome: Secondary | ICD-10-CM | POA: Diagnosis not present

## 2018-05-19 DIAGNOSIS — R600 Localized edema: Secondary | ICD-10-CM | POA: Diagnosis not present

## 2018-05-19 DIAGNOSIS — R682 Dry mouth, unspecified: Secondary | ICD-10-CM | POA: Diagnosis not present

## 2018-05-19 DIAGNOSIS — Z79899 Other long term (current) drug therapy: Secondary | ICD-10-CM | POA: Diagnosis not present

## 2018-05-19 DIAGNOSIS — I10 Essential (primary) hypertension: Secondary | ICD-10-CM | POA: Diagnosis not present

## 2018-06-10 ENCOUNTER — Other Ambulatory Visit (HOSPITAL_COMMUNITY): Payer: Self-pay | Admitting: Internal Medicine

## 2018-06-10 DIAGNOSIS — Z1231 Encounter for screening mammogram for malignant neoplasm of breast: Secondary | ICD-10-CM

## 2018-06-30 ENCOUNTER — Ambulatory Visit (HOSPITAL_COMMUNITY)
Admission: RE | Admit: 2018-06-30 | Discharge: 2018-06-30 | Disposition: A | Discharge status: Home / Self Care | Payer: Medicare Other | Source: Ambulatory Visit | Attending: Internal Medicine | Admitting: Internal Medicine

## 2018-06-30 ENCOUNTER — Encounter (HOSPITAL_COMMUNITY): Payer: Self-pay

## 2018-06-30 DIAGNOSIS — Z1231 Encounter for screening mammogram for malignant neoplasm of breast: Secondary | ICD-10-CM | POA: Insufficient documentation

## 2018-07-31 DIAGNOSIS — K219 Gastro-esophageal reflux disease without esophagitis: Secondary | ICD-10-CM | POA: Diagnosis not present

## 2018-07-31 DIAGNOSIS — M549 Dorsalgia, unspecified: Secondary | ICD-10-CM | POA: Diagnosis not present

## 2018-08-11 ENCOUNTER — Encounter: Payer: Self-pay | Admitting: Obstetrics & Gynecology

## 2018-08-11 ENCOUNTER — Ambulatory Visit (INDEPENDENT_AMBULATORY_CARE_PROVIDER_SITE_OTHER): Payer: Medicare Other | Admitting: Obstetrics & Gynecology

## 2018-08-11 VITALS — BP 130/74 | HR 92 | Ht 62.4 in | Wt 173.6 lb

## 2018-08-11 DIAGNOSIS — N812 Incomplete uterovaginal prolapse: Secondary | ICD-10-CM | POA: Diagnosis not present

## 2018-08-11 DIAGNOSIS — Z4689 Encounter for fitting and adjustment of other specified devices: Secondary | ICD-10-CM | POA: Diagnosis not present

## 2018-08-11 NOTE — Progress Notes (Signed)
Chief Complaint  Patient presents with  . Pessary Check    clean    Blood pressure 130/74, pulse 92, height 5' 2.4" (1.585 m), weight 173 lb 9.6 oz (78.7 kg).  Kim [name] presents today for routine follow up related to her pessary.   She uses a Milex ring with support #5 She reports no vaginal discharge or vaginal bleeding.  Exam reveals no undue vaginal mucosal pressure of breakdown, no discharge and no vaginal bleeding.  The pessary is removed, cleaned and replaced without difficulty.    Kim [name] will be sen back in 4 months for continued follow up.  Florian Buff, MD  08/11/2018 11:19 AM

## 2018-08-12 DIAGNOSIS — R7303 Prediabetes: Secondary | ICD-10-CM | POA: Diagnosis not present

## 2018-08-12 DIAGNOSIS — Z13220 Encounter for screening for lipoid disorders: Secondary | ICD-10-CM | POA: Diagnosis not present

## 2018-08-12 DIAGNOSIS — Z79899 Other long term (current) drug therapy: Secondary | ICD-10-CM | POA: Diagnosis not present

## 2018-08-12 DIAGNOSIS — I10 Essential (primary) hypertension: Secondary | ICD-10-CM | POA: Diagnosis not present

## 2018-08-12 DIAGNOSIS — Z0001 Encounter for general adult medical examination with abnormal findings: Secondary | ICD-10-CM | POA: Diagnosis not present

## 2018-08-18 DIAGNOSIS — K219 Gastro-esophageal reflux disease without esophagitis: Secondary | ICD-10-CM | POA: Diagnosis not present

## 2018-08-18 DIAGNOSIS — E7849 Other hyperlipidemia: Secondary | ICD-10-CM | POA: Diagnosis not present

## 2018-08-18 DIAGNOSIS — Z0001 Encounter for general adult medical examination with abnormal findings: Secondary | ICD-10-CM | POA: Diagnosis not present

## 2018-08-18 DIAGNOSIS — I5032 Chronic diastolic (congestive) heart failure: Secondary | ICD-10-CM | POA: Diagnosis not present

## 2018-08-18 DIAGNOSIS — M069 Rheumatoid arthritis, unspecified: Secondary | ICD-10-CM | POA: Diagnosis not present

## 2018-08-18 DIAGNOSIS — I10 Essential (primary) hypertension: Secondary | ICD-10-CM | POA: Diagnosis not present

## 2018-09-05 ENCOUNTER — Ambulatory Visit (INDEPENDENT_AMBULATORY_CARE_PROVIDER_SITE_OTHER): Payer: Medicare Other | Admitting: Podiatry

## 2018-09-05 ENCOUNTER — Encounter: Payer: Self-pay | Admitting: Podiatry

## 2018-09-05 DIAGNOSIS — L84 Corns and callosities: Secondary | ICD-10-CM | POA: Diagnosis not present

## 2018-09-05 DIAGNOSIS — M2041 Other hammer toe(s) (acquired), right foot: Secondary | ICD-10-CM | POA: Diagnosis not present

## 2018-09-07 NOTE — Progress Notes (Signed)
Subjective:   Patient ID: Kim [name], female   DOB: 77 y.o.   MRN: [id number]   HPI Patient presents stating she is getting a lot of pain in the third toe of her right foot and it is making it hard to bear weight.  She does not remember specific injury   ROS      Objective:  Physical Exam  Neurovascular status intact with patient found to have distal keratotic lesion digit 3 right with rotation of the toe and pressure upon palpation to this area.  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  At this time using sharp sterile technique I debrided the distal lesion I then applied buttress pad to lift the toe and explained that we may ultimately have to do arthroplasty and educated her on procedure

## 2018-12-11 ENCOUNTER — Ambulatory Visit (INDEPENDENT_AMBULATORY_CARE_PROVIDER_SITE_OTHER): Payer: Medicare Other | Admitting: Obstetrics & Gynecology

## 2018-12-11 ENCOUNTER — Encounter: Payer: Self-pay | Admitting: Obstetrics & Gynecology

## 2018-12-11 ENCOUNTER — Other Ambulatory Visit: Payer: Self-pay

## 2018-12-11 VITALS — BP 133/71 | HR 81 | Ht 62.0 in | Wt 178.0 lb

## 2018-12-11 DIAGNOSIS — N812 Incomplete uterovaginal prolapse: Secondary | ICD-10-CM | POA: Diagnosis not present

## 2018-12-11 DIAGNOSIS — Z4689 Encounter for fitting and adjustment of other specified devices: Secondary | ICD-10-CM

## 2018-12-11 NOTE — Progress Notes (Signed)
Patient ID: Kim [name], female   DOB: 04-03-1941, 77 y.o.   MRN: [id number] Chief Complaint  Patient presents with  . pessary maintenance    Blood pressure 133/71, pulse 81, height 5\' 2"  (1.575 m), weight 178 lb (80.7 kg).  [name] presents today for routine follow up related to her pessary.   She uses a Milex ring with support #5 She reports no vaginal discharge or vaginal bleeding.  Exam reveals no undue vaginal mucosal pressure of breakdown, no discharge and no vaginal bleeding.  The pessary is removed, cleaned and replaced without difficulty.    [name] will be sen back in 4 months for continued follow up.  Florian Buff, MD  12/11/2018 12:11 PM

## 2019-02-16 DIAGNOSIS — R21 Rash and other nonspecific skin eruption: Secondary | ICD-10-CM | POA: Diagnosis not present

## 2019-02-16 DIAGNOSIS — I10 Essential (primary) hypertension: Secondary | ICD-10-CM | POA: Diagnosis not present

## 2019-02-16 DIAGNOSIS — K219 Gastro-esophageal reflux disease without esophagitis: Secondary | ICD-10-CM | POA: Diagnosis not present

## 2019-02-24 DIAGNOSIS — H9193 Unspecified hearing loss, bilateral: Secondary | ICD-10-CM | POA: Diagnosis not present

## 2019-02-24 DIAGNOSIS — M069 Rheumatoid arthritis, unspecified: Secondary | ICD-10-CM | POA: Diagnosis not present

## 2019-03-19 ENCOUNTER — Ambulatory Visit (INDEPENDENT_AMBULATORY_CARE_PROVIDER_SITE_OTHER): Payer: Medicare Other | Admitting: Otolaryngology

## 2019-04-09 ENCOUNTER — Telehealth: Payer: Self-pay | Admitting: *Deleted

## 2019-04-09 NOTE — Telephone Encounter (Signed)
Patient informed that we are not allowing visitors or children to come to appointments at this time. Patient denies any contact with anyone suspected or confirmed of having COVID-19. Pt denies fever, cough, sob, muscle pain, diarrhea, rash, vomiting, abdominal pain, red eye, weakness, bruising or bleeding, joint pain or severe headache.  

## 2019-04-13 ENCOUNTER — Ambulatory Visit: Payer: Self-pay | Admitting: Obstetrics & Gynecology

## 2019-04-14 ENCOUNTER — Ambulatory Visit (INDEPENDENT_AMBULATORY_CARE_PROVIDER_SITE_OTHER): Payer: Medicare Other | Admitting: Obstetrics & Gynecology

## 2019-04-14 ENCOUNTER — Encounter: Payer: Self-pay | Admitting: Obstetrics & Gynecology

## 2019-04-14 ENCOUNTER — Other Ambulatory Visit: Payer: Self-pay

## 2019-04-14 VITALS — BP 130/73 | HR 87 | Ht 62.0 in | Wt 178.0 lb

## 2019-04-14 DIAGNOSIS — N812 Incomplete uterovaginal prolapse: Secondary | ICD-10-CM

## 2019-04-14 DIAGNOSIS — Z4689 Encounter for fitting and adjustment of other specified devices: Secondary | ICD-10-CM | POA: Diagnosis not present

## 2019-04-14 NOTE — Progress Notes (Signed)
Patient ID: Kim [name], female   DOB: 06-Jan-1941, 77 y.o.   MRN: [id number] Chief Complaint  Patient presents with  . Pessary Check    Blood pressure 130/73, pulse 87, height 5\' 2"  (1.575 m), weight 178 lb (80.7 kg).  Kim [name] presents today for routine follow up related to her pessary.   She uses a milex ring with support #5 She reports no vaginal discharge or vaginal bleeding.  Exam reveals no undue vaginal mucosal pressure of breakdown, no discharge and no vaginal bleeding.  The pessary is removed, cleaned and replaced without difficulty.    Kim [name] will be sen back in 4 months for continued follow up.  Florian Buff, MD  04/14/2019 11:52 AM

## 2019-05-26 ENCOUNTER — Other Ambulatory Visit (HOSPITAL_COMMUNITY): Payer: Self-pay | Admitting: Family Medicine

## 2019-05-26 DIAGNOSIS — Z1231 Encounter for screening mammogram for malignant neoplasm of breast: Secondary | ICD-10-CM

## 2019-07-02 ENCOUNTER — Ambulatory Visit (INDEPENDENT_AMBULATORY_CARE_PROVIDER_SITE_OTHER): Payer: Medicare Other | Admitting: Podiatry

## 2019-07-02 ENCOUNTER — Other Ambulatory Visit: Payer: Self-pay | Admitting: Podiatry

## 2019-07-02 ENCOUNTER — Encounter: Payer: Self-pay | Admitting: Podiatry

## 2019-07-02 ENCOUNTER — Ambulatory Visit (INDEPENDENT_AMBULATORY_CARE_PROVIDER_SITE_OTHER): Payer: Medicare Other

## 2019-07-02 ENCOUNTER — Other Ambulatory Visit: Payer: Self-pay

## 2019-07-02 VITALS — Temp 97.7°F

## 2019-07-02 DIAGNOSIS — M6740 Ganglion, unspecified site: Secondary | ICD-10-CM

## 2019-07-02 DIAGNOSIS — D169 Benign neoplasm of bone and articular cartilage, unspecified: Secondary | ICD-10-CM

## 2019-07-02 DIAGNOSIS — M2041 Other hammer toe(s) (acquired), right foot: Secondary | ICD-10-CM

## 2019-07-02 DIAGNOSIS — L84 Corns and callosities: Secondary | ICD-10-CM

## 2019-07-02 NOTE — Progress Notes (Signed)
Subjective:   Patient ID: Kim [name], female   DOB: 78 y.o.   MRN: [id number]   HPI Patient presents stating that the third toe has been bothering her again also she has a spur in the top of the right foot on the medial side that is sore it makes it hard to wear shoe gear comfortably.  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  She will continue to wear wider shoes with softer materials and I went ahead and I did debride the lesion today with no iatrogenic bleeding which needs to be done periodically.  Reappoint to recheck  X-rays indicate that there is a large spur at the metatarsocuneiform joint

## 2019-07-06 ENCOUNTER — Other Ambulatory Visit: Payer: Self-pay

## 2019-07-06 ENCOUNTER — Encounter (HOSPITAL_COMMUNITY): Payer: Self-pay

## 2019-07-06 ENCOUNTER — Ambulatory Visit (HOSPITAL_COMMUNITY)
Admission: RE | Admit: 2019-07-06 | Discharge: 2019-07-06 | Disposition: A | Discharge status: Home / Self Care | Payer: Medicare Other | Source: Ambulatory Visit | Attending: Family Medicine | Admitting: Family Medicine

## 2019-07-06 DIAGNOSIS — Z1231 Encounter for screening mammogram for malignant neoplasm of breast: Secondary | ICD-10-CM | POA: Insufficient documentation

## 2019-08-13 ENCOUNTER — Telehealth: Payer: Self-pay | Admitting: Obstetrics & Gynecology

## 2019-08-13 NOTE — Telephone Encounter (Signed)

## 2019-08-14 ENCOUNTER — Ambulatory Visit: Payer: Medicare Other | Admitting: Obstetrics & Gynecology

## 2019-08-14 DIAGNOSIS — E559 Vitamin D deficiency, unspecified: Secondary | ICD-10-CM | POA: Diagnosis not present

## 2019-08-14 DIAGNOSIS — M069 Rheumatoid arthritis, unspecified: Secondary | ICD-10-CM | POA: Diagnosis not present

## 2019-08-14 DIAGNOSIS — R7303 Prediabetes: Secondary | ICD-10-CM | POA: Diagnosis not present

## 2019-08-14 DIAGNOSIS — E7849 Other hyperlipidemia: Secondary | ICD-10-CM | POA: Diagnosis not present

## 2019-08-14 DIAGNOSIS — I10 Essential (primary) hypertension: Secondary | ICD-10-CM | POA: Diagnosis not present

## 2019-08-20 ENCOUNTER — Telehealth: Payer: Self-pay | Admitting: Obstetrics & Gynecology

## 2019-08-20 DIAGNOSIS — Z0001 Encounter for general adult medical examination with abnormal findings: Secondary | ICD-10-CM | POA: Diagnosis not present

## 2019-08-20 DIAGNOSIS — R7303 Prediabetes: Secondary | ICD-10-CM | POA: Diagnosis not present

## 2019-08-20 DIAGNOSIS — E7849 Other hyperlipidemia: Secondary | ICD-10-CM | POA: Diagnosis not present

## 2019-08-20 DIAGNOSIS — I10 Essential (primary) hypertension: Secondary | ICD-10-CM | POA: Diagnosis not present

## 2019-08-20 DIAGNOSIS — E559 Vitamin D deficiency, unspecified: Secondary | ICD-10-CM | POA: Diagnosis not present

## 2019-08-20 NOTE — Telephone Encounter (Signed)

## 2019-08-21 ENCOUNTER — Ambulatory Visit (INDEPENDENT_AMBULATORY_CARE_PROVIDER_SITE_OTHER): Payer: Medicare Other | Admitting: Obstetrics & Gynecology

## 2019-08-21 ENCOUNTER — Other Ambulatory Visit: Payer: Self-pay

## 2019-08-21 ENCOUNTER — Encounter: Payer: Self-pay | Admitting: Obstetrics & Gynecology

## 2019-08-21 VITALS — BP 122/77 | HR 79 | Ht 62.0 in | Wt 187.3 lb

## 2019-08-21 DIAGNOSIS — N812 Incomplete uterovaginal prolapse: Secondary | ICD-10-CM

## 2019-08-21 DIAGNOSIS — Z4689 Encounter for fitting and adjustment of other specified devices: Secondary | ICD-10-CM | POA: Diagnosis not present

## 2019-08-21 NOTE — Progress Notes (Signed)
Chief Complaint  Patient presents with  . Pessary Check    Blood pressure 122/77, pulse 79, height 5\' 2"  (1.575 m), weight 187 lb 4.8 oz (85 kg).  Kim [name] presents today for routine follow up related to her pessary.   She uses a Milex ring with support #5 She reports no vaginal discharge or vaginal bleeding.  Exam reveals no undue vaginal mucosal pressure of breakdown, no discharge and no vaginal bleeding.  The pessary is removed, cleaned and replaced without difficulty.    [name] will be sen back in 4 months for continued follow up.  Florian Buff, MD  08/21/2019 12:12 PM

## 2019-11-17 DIAGNOSIS — R7303 Prediabetes: Secondary | ICD-10-CM | POA: Diagnosis not present

## 2019-11-17 DIAGNOSIS — E7849 Other hyperlipidemia: Secondary | ICD-10-CM | POA: Diagnosis not present

## 2019-11-17 DIAGNOSIS — E559 Vitamin D deficiency, unspecified: Secondary | ICD-10-CM | POA: Diagnosis not present

## 2019-11-17 DIAGNOSIS — I10 Essential (primary) hypertension: Secondary | ICD-10-CM | POA: Diagnosis not present

## 2019-11-17 DIAGNOSIS — Z79899 Other long term (current) drug therapy: Secondary | ICD-10-CM | POA: Diagnosis not present

## 2019-11-23 DIAGNOSIS — R600 Localized edema: Secondary | ICD-10-CM | POA: Diagnosis not present

## 2019-11-23 DIAGNOSIS — E7849 Other hyperlipidemia: Secondary | ICD-10-CM | POA: Diagnosis not present

## 2019-11-23 DIAGNOSIS — M25511 Pain in right shoulder: Secondary | ICD-10-CM | POA: Diagnosis not present

## 2019-11-23 DIAGNOSIS — I10 Essential (primary) hypertension: Secondary | ICD-10-CM | POA: Diagnosis not present

## 2019-11-23 DIAGNOSIS — R7303 Prediabetes: Secondary | ICD-10-CM | POA: Diagnosis not present

## 2019-12-07 ENCOUNTER — Telehealth: Payer: Self-pay | Admitting: Orthopaedic Surgery

## 2019-12-07 NOTE — Telephone Encounter (Signed)
Patient calling, asked about seeing Dr Luna Glasgow (had seen in years past) - for knee swelling. States she has been seeing Dr Elmyra Ricks in [place], but that it is sometimes hard to get over to [place]. Discussed appointment Vs staying with current orthopaedic doctor; said will try to work out other transportation so she can stay with Dr Elmyra Ricks.

## 2019-12-08 ENCOUNTER — Telehealth: Payer: Self-pay | Admitting: Orthopaedic Surgery

## 2019-12-08 NOTE — Telephone Encounter (Signed)
Patient called back here today repeating the same as yesterday about setting up an appointment.  She has been seeing Dr. Synthia Innocent and will have them send records.  Once we receive these notes, we will call her to schedule an appointment if Dr. Luna Glasgow agrees to do so.  She understands and will call [facility].  I told her that it may be quicker for her to get in with Dr. Synthia Innocent but she said she didn't want to go back to [place].  I told her this was her choice but that we did need some previous notes.  She said she understood

## 2019-12-18 DIAGNOSIS — M25562 Pain in left knee: Secondary | ICD-10-CM | POA: Diagnosis not present

## 2019-12-18 DIAGNOSIS — M170 Bilateral primary osteoarthritis of knee: Secondary | ICD-10-CM | POA: Diagnosis not present

## 2019-12-18 DIAGNOSIS — G8929 Other chronic pain: Secondary | ICD-10-CM | POA: Diagnosis not present

## 2019-12-18 DIAGNOSIS — M25561 Pain in right knee: Secondary | ICD-10-CM | POA: Diagnosis not present

## 2019-12-21 ENCOUNTER — Ambulatory Visit: Payer: Medicare Other | Admitting: Obstetrics & Gynecology

## 2019-12-23 ENCOUNTER — Telehealth: Payer: Self-pay | Admitting: Obstetrics & Gynecology

## 2019-12-23 NOTE — Telephone Encounter (Signed)
Called patient regarding appointment scheduled in our office and advised to come alone to the visits, however, a support person, over age 77, may accompany her  to appointment if assistance is needed for safety or care concerns. Otherwise, support persons should remain outside until the visit is complete.   We ask if you have had any exposure to anyone suspected or confirmed of having COVID-19, are awaiting test results for COVID-19 or if you are experiencing any of the following, to call and reschedule your appointment: fever, cough, shortness of breath, muscle pain, diarrhea, rash, vomiting, abdominal pain, red eye, weakness, bruising, bleeding, joint pain, or a severe headache.   Please know we will ask you these questions or similar questions when you arrive for your appointment and again it's how we are keeping everyone safe.    Also,to keep you safe, please use the provided hand sanitizer when you enter the office. We are asking everyone in the office to wear a mask to help prevent the spread of germs. If you have a mask of your own, please wear it to your appointment, if not, we are happy to provide one for you.  Thank you for understanding and your cooperation.    [facility] Staff    

## 2019-12-28 ENCOUNTER — Ambulatory Visit (INDEPENDENT_AMBULATORY_CARE_PROVIDER_SITE_OTHER): Payer: Medicare Other | Admitting: Obstetrics & Gynecology

## 2019-12-28 ENCOUNTER — Encounter: Payer: Self-pay | Admitting: Obstetrics & Gynecology

## 2019-12-28 ENCOUNTER — Other Ambulatory Visit: Payer: Self-pay

## 2019-12-28 VITALS — BP 127/70 | HR 93 | Ht 62.0 in | Wt 188.0 lb

## 2019-12-28 DIAGNOSIS — Z4689 Encounter for fitting and adjustment of other specified devices: Secondary | ICD-10-CM

## 2019-12-28 DIAGNOSIS — N812 Incomplete uterovaginal prolapse: Secondary | ICD-10-CM

## 2019-12-28 NOTE — Progress Notes (Signed)
Chief Complaint  Patient presents with  . Pessary Check    Blood pressure 127/70, pulse 93, height 5\' 2"  (1.575 m), weight 188 lb (85.3 kg).  Kim [name] presents today for routine follow up related to her pessary.   She uses a Milex ring with support #5 She reports no vaginal discharge or vaginal bleeding.  Exam reveals no undue vaginal mucosal pressure of breakdown, no discharge and no vaginal bleeding.  The pessary is removed, cleaned and replaced without difficulty.    Kim [name] will be sen back in 4 months for continued follow up.  Florian Buff, MD  12/28/2019 11:36 AM

## 2020-02-25 ENCOUNTER — Emergency Department (HOSPITAL_COMMUNITY)
Admission: EM | Admit: 2020-02-25 | Discharge: 2020-02-25 | Disposition: A | Discharge status: Home / Self Care | Payer: Medicare Other | Attending: Emergency Medicine | Admitting: Emergency Medicine

## 2020-02-25 ENCOUNTER — Encounter (HOSPITAL_COMMUNITY): Payer: Self-pay | Admitting: Emergency Medicine

## 2020-02-25 ENCOUNTER — Emergency Department (HOSPITAL_COMMUNITY): Payer: Medicare Other

## 2020-02-25 ENCOUNTER — Other Ambulatory Visit: Payer: Self-pay

## 2020-02-25 DIAGNOSIS — R0989 Other specified symptoms and signs involving the circulatory and respiratory systems: Secondary | ICD-10-CM | POA: Diagnosis not present

## 2020-02-25 DIAGNOSIS — M7989 Other specified soft tissue disorders: Secondary | ICD-10-CM | POA: Diagnosis present

## 2020-02-25 DIAGNOSIS — I10 Essential (primary) hypertension: Secondary | ICD-10-CM | POA: Diagnosis not present

## 2020-02-25 DIAGNOSIS — R600 Localized edema: Secondary | ICD-10-CM | POA: Diagnosis not present

## 2020-02-25 DIAGNOSIS — R609 Edema, unspecified: Secondary | ICD-10-CM | POA: Diagnosis not present

## 2020-02-25 DIAGNOSIS — M069 Rheumatoid arthritis, unspecified: Secondary | ICD-10-CM | POA: Insufficient documentation

## 2020-02-25 DIAGNOSIS — Z79899 Other long term (current) drug therapy: Secondary | ICD-10-CM | POA: Insufficient documentation

## 2020-02-25 LAB — CBC WITH DIFFERENTIAL/PLATELET
Abs Immature Granulocytes: 0.02 10*3/uL (ref 0.00–0.07)
Basophils Absolute: 0.1 10*3/uL (ref 0.0–0.1)
Basophils Relative: 1 %
Eosinophils Absolute: 0.3 10*3/uL (ref 0.0–0.5)
Eosinophils Relative: 4 %
HCT: 42.6 % (ref 36.0–46.0)
Hemoglobin: 13.4 g/dL (ref 12.0–15.0)
Immature Granulocytes: 0 %
Lymphocytes Relative: 41 %
Lymphs Abs: 2.9 10*3/uL (ref 0.7–4.0)
MCH: 28.8 pg (ref 26.0–34.0)
MCHC: 31.5 g/dL (ref 30.0–36.0)
MCV: 91.4 fL (ref 80.0–100.0)
Monocytes Absolute: 0.6 10*3/uL (ref 0.1–1.0)
Monocytes Relative: 9 %
Neutro Abs: 3.2 10*3/uL (ref 1.7–7.7)
Neutrophils Relative %: 45 %
Platelets: 211 10*3/uL (ref 150–400)
RBC: 4.66 MIL/uL (ref 3.87–5.11)
RDW: 14.2 % (ref 11.5–15.5)
WBC: 7.1 10*3/uL (ref 4.0–10.5)
nRBC: 0 % (ref 0.0–0.2)

## 2020-02-25 LAB — COMPREHENSIVE METABOLIC PANEL
ALT: 10 U/L (ref 0–44)
AST: 21 U/L (ref 15–41)
Albumin: 3.5 g/dL (ref 3.5–5.0)
Alkaline Phosphatase: 71 U/L (ref 38–126)
Anion gap: 9 (ref 5–15)
BUN: 22 mg/dL (ref 8–23)
CO2: 27 mmol/L (ref 22–32)
Calcium: 9 mg/dL (ref 8.9–10.3)
Chloride: 103 mmol/L (ref 98–111)
Creatinine, Ser: 0.93 mg/dL (ref 0.44–1.00)
GFR calc Af Amer: >60 mL/min (ref >60)
GFR calc non Af Amer: 59 mL/min — ABNORMAL LOW (ref >60)
Glucose, Bld: 91 mg/dL (ref 70–99)
Potassium: 3.7 mmol/L (ref 3.5–5.1)
Sodium: 139 mmol/L (ref 135–145)
Total Bilirubin: 0.7 mg/dL (ref 0.3–1.2)
Total Protein: 7.7 g/dL (ref 6.5–8.1)

## 2020-02-25 LAB — BRAIN NATRIURETIC PEPTIDE: B Natriuretic Peptide: 47 pg/mL (ref 0.0–100.0)

## 2020-02-25 MED ORDER — FUROSEMIDE 20 MG PO TABS: ORAL_TABLET | ORAL | 0 refills | Status: DC

## 2020-02-25 NOTE — ED Provider Notes (Addendum)
[facility] Provider Note   CSN: [id number] Arrival date & time: 02/25/20  1013     History Chief Complaint  Patient presents with  . Leg Swelling    Kim [name] is a 79 y.o. female.  The history is provided by the patient. No language interpreter was used.  Leg Pain Location:  Leg Injury: no   Pain details:    Quality:  Aching   Severity:  Mild   Onset quality:  Gradual   Duration:  2 weeks   Timing:  Constant Chronicity:  New Dislocation: no   Relieved by:  Nothing Worsened by:  Nothing Ineffective treatments:  None tried Associated symptoms: no back pain   Risk factors: no concern for non-accidental trauma   Pt reports she swelling in both legs.  Pt reports no change in diest      Past Medical History:  Diagnosis Date  . Acid reflux   . Arthritis   . Back spasm   . Hypertension   . Rheumatoid arthritis [facility])     Patient Active Problem List   Diagnosis Date Noted  . Uterovaginal prolapse, incomplete 08/06/2013  . LOW BACK PAIN 01/09/2010  . BACK PAIN, CHRONIC 01/09/2010  . 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 KNEE SURGERY    . TUBAL LIGATION       OB History    Gravida  8   Para  8   Term  8   Preterm      AB      Living  8     SAB      TAB      Ectopic      Multiple      Live Births  8           Family History  Problem Relation Age of Onset  . Heart failure Mother   . Stroke Mother   . Heart failure Father   . Stroke Father   . Hypertension Sister     Social History   Tobacco Use  . Smoking status: Never Smoker  . Smokeless tobacco: Never Used  Substance Use Topics  . Alcohol use: No  . Drug use: No    Home Medications Prior to Admission medications   Medication Sig Start Date End Date Taking? Authorizing Provider  acetaminophen (TYLENOL) 325 MG tablet Take 650 mg by mouth as needed.   Yes [provider]  ASPERCREME LIDOCAINE EX Apply 1 application topically daily.   Yes [provider]  Cholecalciferol (VITAMIN D3 PO) Take by mouth.   Yes [provider]  triamterene-hydrochlorothiazide (MAXZIDE-25) 37.5-25 MG tablet Take 1 tablet by mouth daily.  01/09/17  Yes [provider]  albuterol (PROVENTIL HFA;VENTOLIN HFA) 108 (90 Base) MCG/ACT inhaler Inhale 1-2 puffs into the lungs every 6 (six) hours as needed for wheezing or shortness of breath. 03/05/18   Long, [facility], MD    Allergies    Tramadol  Review of Systems   Review of Systems  Musculoskeletal: Negative for back pain.  All other systems reviewed and are negative.   Physical Exam Updated Vital Signs BP (!) 141/71   Pulse 69   Temp 97.6 F (36.4 C) (Oral)   Resp 12   Ht 5\' 5"  (1.651 m)   Wt 83.9 kg   SpO2 100%   BMI 30.79 kg/m   Physical Exam Vitals and nursing note  reviewed.  Constitutional:      Appearance: She is well-developed.  HENT:     Head: Normocephalic.  Cardiovascular:     Rate and Rhythm: Normal rate.  Pulmonary:     Effort: Pulmonary effort is normal.  Abdominal:     General: There is no distension.  Musculoskeletal:        General: Swelling and tenderness present. Normal range of motion.     Cervical back: Normal range of motion.  Skin:    General: Skin is warm.  Neurological:     General: No focal deficit present.     Mental Status: She is alert and oriented to person, place, and time.  Psychiatric:        Mood and Affect: Mood normal.     ED Results / Procedures / Treatments   Labs (all labs ordered are listed, but only abnormal results are displayed) Labs Reviewed  COMPREHENSIVE METABOLIC PANEL - Abnormal; Notable for the following components:      Result Value   GFR calc non Af Amer 59 (*)    All other components within normal limits  CBC WITH DIFFERENTIAL/PLATELET  BRAIN NATRIURETIC PEPTIDE    EKG None  Radiology DG Chest 2 View   Result Date: 02/25/2020 CLINICAL DATA:  Bilateral lower extremity swelling. EXAM: CHEST - 2 VIEW COMPARISON:  Chest x-ray dated March 05, 2018. FINDINGS: The heart size and mediastinal contours are within normal limits. New mild pulmonary vascular congestion superimposed on chronic interstitial prominence. No focal consolidation, pleural effusion, or pneumothorax. No acute osseous abnormality. IMPRESSION: New mild pulmonary vascular congestion. Electronically Signed   By: Titus Dubin M.D.   On: 02/25/2020 14:03    Procedures Procedures (including critical care time)  Medications Ordered in ED Medications - No data to display  ED Course  I have reviewed the triage vital signs and the nursing notes.  Pertinent labs & imaging results that were available during my care of the patient were reviewed by me and considered in my medical decision making (see chart for details).    MDM Rules/Calculators/A&P                     MDM: labs reviewed, no acute abnormality, chest xray normal, Ekg is normal.  Pt given rx for lsaix for swelling  Final Clinical Impression(s) / ED Diagnoses Final diagnoses:  Bilateral lower extremity edema    Rx / DC Orders ED Discharge Orders         Ordered    furosemide (LASIX) 20 MG tablet     02/25/20 1449        An After Visit Summary was printed and given to the patient.    Fransico Meadow, PA-C 02/25/20 1450    [address] 02/25/20 1451    Milton Ferguson, MD 02/27/20 [phone]

## 2020-02-25 NOTE — ED Triage Notes (Signed)
Patient reports bilateral LE edema that started on Monday. Denies history of CHF.

## 2020-02-25 NOTE — ED Notes (Signed)
Patient ambulatory to restroom  ?

## 2020-02-25 NOTE — Discharge Instructions (Signed)
Return if any problems.

## 2020-03-07 DIAGNOSIS — R600 Localized edema: Secondary | ICD-10-CM | POA: Diagnosis not present

## 2020-03-07 DIAGNOSIS — I10 Essential (primary) hypertension: Secondary | ICD-10-CM | POA: Diagnosis not present

## 2020-03-08 DIAGNOSIS — R7303 Prediabetes: Secondary | ICD-10-CM | POA: Diagnosis not present

## 2020-03-08 DIAGNOSIS — E7849 Other hyperlipidemia: Secondary | ICD-10-CM | POA: Diagnosis not present

## 2020-03-08 DIAGNOSIS — I10 Essential (primary) hypertension: Secondary | ICD-10-CM | POA: Diagnosis not present

## 2020-03-17 DIAGNOSIS — M170 Bilateral primary osteoarthritis of knee: Secondary | ICD-10-CM | POA: Diagnosis not present

## 2020-03-21 DIAGNOSIS — Z79899 Other long term (current) drug therapy: Secondary | ICD-10-CM | POA: Diagnosis not present

## 2020-03-21 DIAGNOSIS — I10 Essential (primary) hypertension: Secondary | ICD-10-CM | POA: Diagnosis not present

## 2020-04-25 ENCOUNTER — Telehealth: Payer: Self-pay | Admitting: Obstetrics & Gynecology

## 2020-04-25 NOTE — Telephone Encounter (Signed)

## 2020-04-26 ENCOUNTER — Other Ambulatory Visit: Payer: Self-pay

## 2020-04-26 ENCOUNTER — Ambulatory Visit (INDEPENDENT_AMBULATORY_CARE_PROVIDER_SITE_OTHER): Payer: Medicare Other | Admitting: Obstetrics & Gynecology

## 2020-04-26 ENCOUNTER — Encounter: Payer: Self-pay | Admitting: Obstetrics & Gynecology

## 2020-04-26 VITALS — BP 129/90 | HR 109 | Ht 62.0 in | Wt 194.0 lb

## 2020-04-26 DIAGNOSIS — N812 Incomplete uterovaginal prolapse: Secondary | ICD-10-CM

## 2020-04-26 DIAGNOSIS — Z466 Encounter for fitting and adjustment of urinary device: Secondary | ICD-10-CM

## 2020-04-26 DIAGNOSIS — Z4689 Encounter for fitting and adjustment of other specified devices: Secondary | ICD-10-CM

## 2020-04-26 NOTE — Progress Notes (Signed)
Chief Complaint  Patient presents with  . Pessary Check    Blood pressure 129/90, pulse (!) 109, height 5\' 2"  (1.575 m), weight 194 lb (88 kg).  Kim [name] presents today for routine follow up related to her pessary.   She uses a Milex ring with support #5 She reports no vaginal discharge or vaginal bleeding.  Exam reveals no undue vaginal mucosal pressure of breakdown, no discharge and no vaginal bleeding.  The pessary is removed, cleaned and replaced without difficulty.    Jamal Antrobus will be sen back in 4 months for continued follow up.  Florian Buff, MD  04/26/2020 11:16 AM

## 2020-05-16 DIAGNOSIS — Z961 Presence of intraocular lens: Secondary | ICD-10-CM | POA: Diagnosis not present

## 2020-05-16 DIAGNOSIS — H25811 Combined forms of age-related cataract, right eye: Secondary | ICD-10-CM | POA: Diagnosis not present

## 2020-05-16 DIAGNOSIS — Z01818 Encounter for other preprocedural examination: Secondary | ICD-10-CM | POA: Diagnosis not present

## 2020-06-03 ENCOUNTER — Emergency Department (HOSPITAL_COMMUNITY)
Admission: EM | Admit: 2020-06-03 | Discharge: 2020-06-03 | Disposition: A | Discharge status: Home / Self Care | Payer: Medicare Other | Attending: Emergency Medicine | Admitting: Emergency Medicine

## 2020-06-03 ENCOUNTER — Encounter (HOSPITAL_COMMUNITY): Payer: Self-pay

## 2020-06-03 ENCOUNTER — Other Ambulatory Visit: Payer: Self-pay

## 2020-06-03 ENCOUNTER — Emergency Department (HOSPITAL_COMMUNITY): Payer: Medicare Other

## 2020-06-03 DIAGNOSIS — I10 Essential (primary) hypertension: Secondary | ICD-10-CM | POA: Insufficient documentation

## 2020-06-03 DIAGNOSIS — R002 Palpitations: Secondary | ICD-10-CM | POA: Insufficient documentation

## 2020-06-03 LAB — TROPONIN I (HIGH SENSITIVITY)
Troponin I (High Sensitivity): 10 ng/L (ref <18)
Troponin I (High Sensitivity): 10 ng/L (ref <18)

## 2020-06-03 LAB — CBC
HCT: 45.3 % (ref 36.0–46.0)
Hemoglobin: 14.3 g/dL (ref 12.0–15.0)
MCH: 29 pg (ref 26.0–34.0)
MCHC: 31.6 g/dL (ref 30.0–36.0)
MCV: 91.9 fL (ref 80.0–100.0)
Platelets: 248 10*3/uL (ref 150–400)
RBC: 4.93 MIL/uL (ref 3.87–5.11)
RDW: 14 % (ref 11.5–15.5)
WBC: 6.2 10*3/uL (ref 4.0–10.5)
nRBC: 0 % (ref 0.0–0.2)

## 2020-06-03 LAB — BASIC METABOLIC PANEL
Anion gap: 11 (ref 5–15)
BUN: 22 mg/dL (ref 8–23)
CO2: 28 mmol/L (ref 22–32)
Calcium: 9.1 mg/dL (ref 8.9–10.3)
Chloride: 101 mmol/L (ref 98–111)
Creatinine, Ser: 0.94 mg/dL (ref 0.44–1.00)
GFR calc Af Amer: >60 mL/min (ref >60)
GFR calc non Af Amer: 59 mL/min — ABNORMAL LOW (ref >60)
Glucose, Bld: 104 mg/dL — ABNORMAL HIGH (ref 70–99)
Potassium: 3.2 mmol/L — ABNORMAL LOW (ref 3.5–5.1)
Sodium: 140 mmol/L (ref 135–145)

## 2020-06-03 LAB — MAGNESIUM: Magnesium: 2.1 mg/dL (ref 1.7–2.4)

## 2020-06-03 MED ORDER — POTASSIUM CHLORIDE CRYS ER 20 MEQ PO TBCR
40.0000 meq | EXTENDED_RELEASE_TABLET | Freq: Once | ORAL | Status: AC
  Administered 2020-06-03: 40 meq via ORAL
  Filled 2020-06-03: qty 2

## 2020-06-03 MED ORDER — SODIUM CHLORIDE 0.9% FLUSH: 3.0000 mL | Freq: Once | INTRAVENOUS | Status: DC

## 2020-06-03 NOTE — ED Triage Notes (Signed)
Pt was supposed to have eye surgery today and had an EKG done that possibly showed A Fib. Sent here by [facility] in [place]. Pt denies any symptoms and feels fine. Denies any chest pain SOB or heart palpitations.

## 2020-06-03 NOTE — Discharge Instructions (Addendum)
Be sure to keep your appointment with your primary care provider as scheduled.  I have also provided information for you to follow-up with cardiology next week to schedule a appointment.  Take an  81 mg baby aspirin daily with food.  Return to the emergency department for any worsening symptoms

## 2020-06-03 NOTE — ED Provider Notes (Signed)
[facility] EMERGENCY DEPARTMENT Provider Note   CSN: [id number] Arrival date & time: 06/03/20  5465     History Chief Complaint  Patient presents with  . Palpitations    Kim [name] is a 79 y.o. female.  HPI      Kim [name] is a 79 y.o. female with past medical history of hypertension, arthritis, and acid reflux who presents to the Emergency Department for evaluation of an arrhythmia, sent here by her ophthalmology group.  Patient was scheduled for right-sided cataract extraction today at [facility].  She had EKG done prior to her procedure and found to be in ? atrial fibrillation and sent here for further evaluation.  Patient denies any complaints at this time.  She states that she has been told in the past that she had an irregular heart rhythm, but has not been evaluated by cardiology for this condition and no documented history of atrial fibrillation patient's medical record.  She specifically denies chest pain, shortness of breath, headache or dizziness.  No syncope.  She denies taking any antiarrhythmia medications or aspirin.   Past Medical History:  Diagnosis Date  . Acid reflux   . Arthritis   . Back spasm   . Hypertension   . Rheumatoid arthritis [facility])     Patient Active Problem List   Diagnosis Date Noted  . Uterovaginal prolapse, incomplete 08/06/2013  . LOW BACK PAIN 01/09/2010  . BACK PAIN, CHRONIC 01/09/2010  . 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 KNEE SURGERY    . TUBAL LIGATION       OB History    Gravida  8   Para  8   Term  8   Preterm      AB      Living  8     SAB      TAB      Ectopic      Multiple      Live Births  8           Family History  Problem Relation Age of Onset  . Heart failure Mother   . Stroke Mother   . Heart failure Father   . Stroke Father   . Hypertension Sister     Social History   Tobacco Use  .  Smoking status: Never Smoker  . Smokeless tobacco: Never Used  Substance Use Topics  . Alcohol use: No  . Drug use: No    Home Medications Prior to Admission medications   Medication Sig Start Date End Date Taking? Authorizing Provider  acetaminophen (TYLENOL) 325 MG tablet Take 650 mg by mouth as needed.    [provider]  albuterol (PROVENTIL HFA;VENTOLIN HFA) 108 (90 Base) MCG/ACT inhaler Inhale 1-2 puffs into the lungs every 6 (six) hours as needed for wheezing or shortness of breath. 03/05/18   Long, Wonda Olds, MD  ASPERCREME LIDOCAINE EX Apply 1 application topically daily.    [provider]  Cholecalciferol (VITAMIN D3 PO) Take by mouth.    [provider]  furosemide (LASIX) 20 MG tablet Take one tablet daily as needed for increased swelling Patient not taking: Reported on 04/26/2020 02/25/20   Fransico Meadow, PA-C  triamterene-hydrochlorothiazide (MAXZIDE-25) 37.5-25 MG tablet Take 1 tablet by mouth daily.  01/09/17   [provider]    Allergies    Tramadol  Review of Systems   Review of Systems  Constitutional: Negative for chills, fatigue and fever.  HENT: Negative for sore throat and trouble swallowing.   Respiratory: Negative for cough, shortness of breath and wheezing.   Cardiovascular: Negative for chest pain and palpitations.  Gastrointestinal: Negative for abdominal pain, blood in stool, nausea and vomiting.  Genitourinary: Negative for dysuria, flank pain and hematuria.  Musculoskeletal: Negative for arthralgias, back pain, myalgias, neck pain and neck stiffness.  Skin: Negative for rash.  Neurological: Negative for dizziness, weakness and numbness.  Hematological: Does not bruise/bleed easily.    Physical Exam Updated Vital Signs BP (!) 151/79 (BP Location: Right Arm)   Pulse 93   Temp 98.4 F (36.9 C) (Oral)   Resp 19   Ht 5\' 3"  (1.6 m)   Wt 88.5 kg   SpO2 99%   BMI 34.54 kg/m   Physical Exam Vitals and nursing  note reviewed.  Constitutional:      Appearance: Normal appearance. She is not ill-appearing.  HENT:     Mouth/Throat:     Mouth: Mucous membranes are moist.  Eyes:     Extraocular Movements: Extraocular movements intact.     Pupils: Pupils are equal, round, and reactive to light.  Cardiovascular:     Rate and Rhythm: Normal rate and regular rhythm.     Pulses: Normal pulses.  Pulmonary:     Effort: Pulmonary effort is normal. No respiratory distress.     Breath sounds: Normal breath sounds.  Chest:     Chest wall: No tenderness.  Abdominal:     General: There is no distension.     Palpations: Abdomen is soft.     Tenderness: There is no abdominal tenderness.  Musculoskeletal:        General: Normal range of motion.     Right lower leg: Edema present.     Left lower leg: Edema present.     Comments: 1+ edema of the bilateral lower extremities.  No erythema or rash.    Skin:    General: Skin is warm.     Capillary Refill: Capillary refill takes less than 2 seconds.     Findings: No rash.  Neurological:     General: No focal deficit present.     Mental Status: She is alert.     Sensory: Sensation is intact. No sensory deficit.     Motor: Motor function is intact. No weakness.     Comments: CN II-XII intact.  Speech clear, no pronator drift.  No facial weakness.       ED Results / Procedures / Treatments   Labs (all labs ordered are listed, but only abnormal results are displayed) Labs Reviewed  BASIC METABOLIC PANEL - Abnormal; Notable for the following components:      Result Value   Potassium 3.2 (*)    Glucose, Bld 104 (*)    GFR calc non Af Amer 59 (*)    All other components within normal limits  CBC  MAGNESIUM  TROPONIN I (HIGH SENSITIVITY)  TROPONIN I (HIGH SENSITIVITY)    EKG EKG Interpretation  Date/Time:  Friday June 03 2020 14:49:02 EDT Ventricular Rate:  58 PR Interval:    QRS Duration: 115 QT Interval:  435 QTC Calculation: 428 R  Axis:   -61 Text Interpretation: Sinus arrhythmia Incomplete left bundle branch block Probable left ventricular hypertrophy Confirmed by Lajean Saver [id number]) on 06/03/2020 4:33:12 PM   Radiology DG Chest 2 View  Result Date: 06/03/2020 CLINICAL DATA:  Shortness of breath. EXAM: CHEST -  2 VIEW COMPARISON:  02/25/2020. FINDINGS: Mediastinum hilar structures normal. Borderline cardiomegaly and pulmonary venous congestion. Mild bilateral interstitial prominence and small bilateral pleural effusions. Mild CHF cannot be excluded. Pneumonitis cannot be excluded. No pneumothorax. IMPRESSION: Borderline cardiomegaly and pulmonary venous congestion. Mild bilateral interstitial prominence and tiny bilateral pleural effusions. Findings suggest mild CHF. Pneumonitis cannot be excluded Electronically Signed   By: Marcello Moores  Register   On: 06/03/2020 12:01    Procedures Procedures (including critical care time)  Medications Ordered in ED Medications  sodium chloride flush (NS) 0.9 % injection 3 mL (has no administration in time range)    ED Course  I have reviewed the triage vital signs and the nursing notes.  Pertinent labs & imaging results that were available during my care of the patient were reviewed by me and considered in my medical decision making (see chart for details).    MDM Rules/Calculators/A&P                      Patient sent here for evaluation from her ophthalmologist for possible atrial fibrillation.  On review of patient's medical record, no documented history of A. fib, patient states to me that she has been told that she has an irregular heart rhythm, but has not seen cardiology or had any medical evaluation for this.  She denies symptoms at present.  On review of patient's medical record, echocardiogram from 2016 showed EF of 55 to 60% and EKG from February of this year showed a sinus rhythm.  Today's work up is unremarkable.  EKG shows a sinus arrhythmia, but no evidence of atrial  fib.  Magnesium level also unremarkable.  Feel that patient is appropriate for discharge home, discussed need to establish primary care.  Patient has an appointment with local PCP for June 16.  I have also provided information for local cardiology and patient agrees to contact their office to arrange a follow-up appointment.    Final Clinical Impression(s) / ED Diagnoses Final diagnoses:  Palpitations    Rx / DC Orders ED Discharge Orders    None       Kem Parkinson, PA-C 06/03/20 1741    Lajean Saver, MD 06/06/20 1410

## 2020-06-27 ENCOUNTER — Other Ambulatory Visit (HOSPITAL_COMMUNITY): Payer: Self-pay | Admitting: Family Medicine

## 2020-06-27 DIAGNOSIS — Z1231 Encounter for screening mammogram for malignant neoplasm of breast: Secondary | ICD-10-CM

## 2020-07-13 ENCOUNTER — Ambulatory Visit (HOSPITAL_COMMUNITY)
Admission: RE | Admit: 2020-07-13 | Discharge: 2020-07-13 | Disposition: A | Discharge status: Home / Self Care | Payer: Medicare Other | Source: Ambulatory Visit | Attending: Family Medicine | Admitting: Family Medicine

## 2020-07-13 ENCOUNTER — Other Ambulatory Visit: Payer: Self-pay

## 2020-07-13 DIAGNOSIS — Z1231 Encounter for screening mammogram for malignant neoplasm of breast: Secondary | ICD-10-CM | POA: Diagnosis present

## 2020-08-26 ENCOUNTER — Ambulatory Visit: Payer: Medicare Other | Admitting: Obstetrics & Gynecology

## 2020-09-08 ENCOUNTER — Ambulatory Visit: Payer: Medicare Other | Admitting: Obstetrics & Gynecology

## 2020-09-09 ENCOUNTER — Other Ambulatory Visit: Payer: Self-pay

## 2020-09-09 ENCOUNTER — Encounter: Payer: Self-pay | Admitting: Obstetrics & Gynecology

## 2020-09-09 ENCOUNTER — Ambulatory Visit (INDEPENDENT_AMBULATORY_CARE_PROVIDER_SITE_OTHER): Payer: Medicare Other | Admitting: Obstetrics & Gynecology

## 2020-09-09 VITALS — BP 133/78 | HR 75 | Ht 64.0 in | Wt 194.5 lb

## 2020-09-09 DIAGNOSIS — Z4689 Encounter for fitting and adjustment of other specified devices: Secondary | ICD-10-CM

## 2020-09-09 DIAGNOSIS — N812 Incomplete uterovaginal prolapse: Secondary | ICD-10-CM | POA: Diagnosis not present

## 2020-12-19 IMAGING — MG DIGITAL SCREENING BILAT W/ TOMO W/ CAD
6 of 10 series · 6 of 30 positions shown · non-contrast
Comparison: Previous exam(s).

CLINICAL DATA: Screening.

EXAM:
DIGITAL SCREENING BILATERAL MAMMOGRAM WITH TOMO AND CAD

[L MLO synth-2D (1 of 2)]
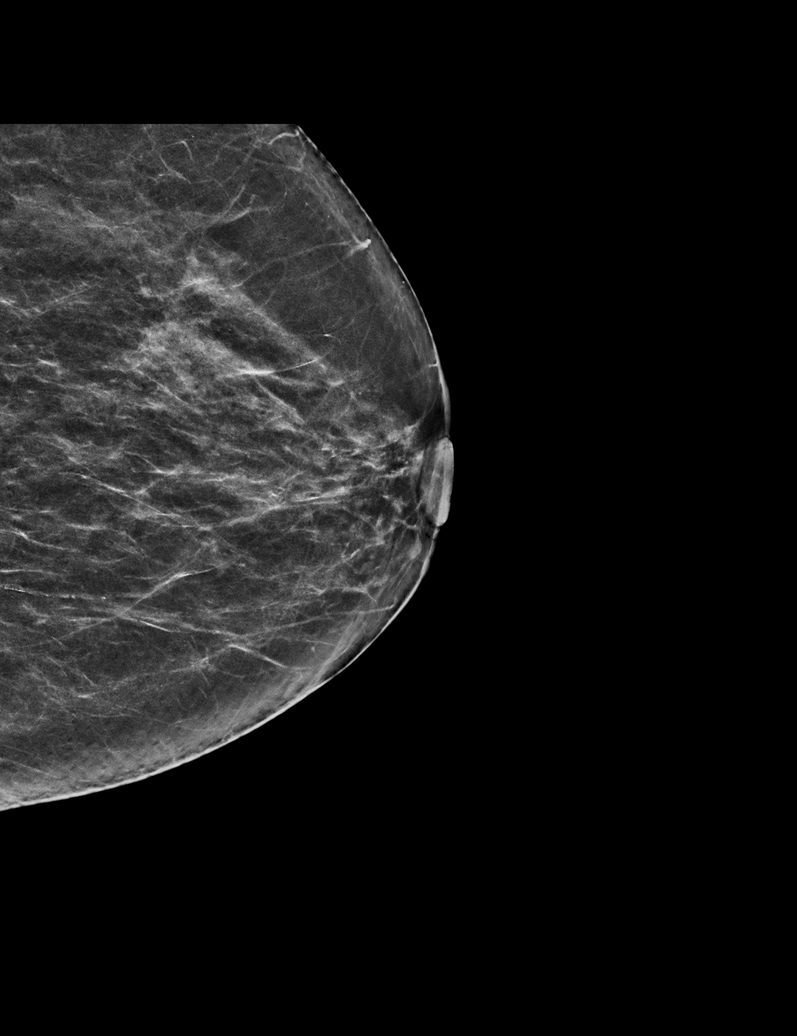

[R CC synth-2D]
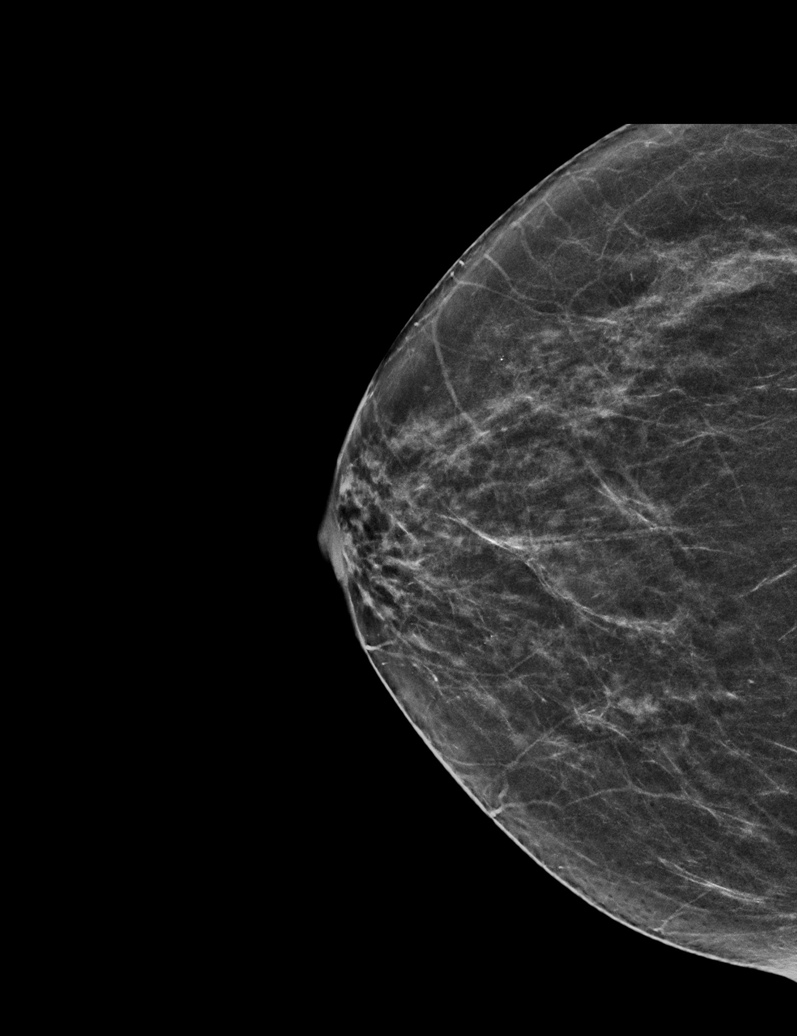

[L MLO synth-2D (2 of 2)]
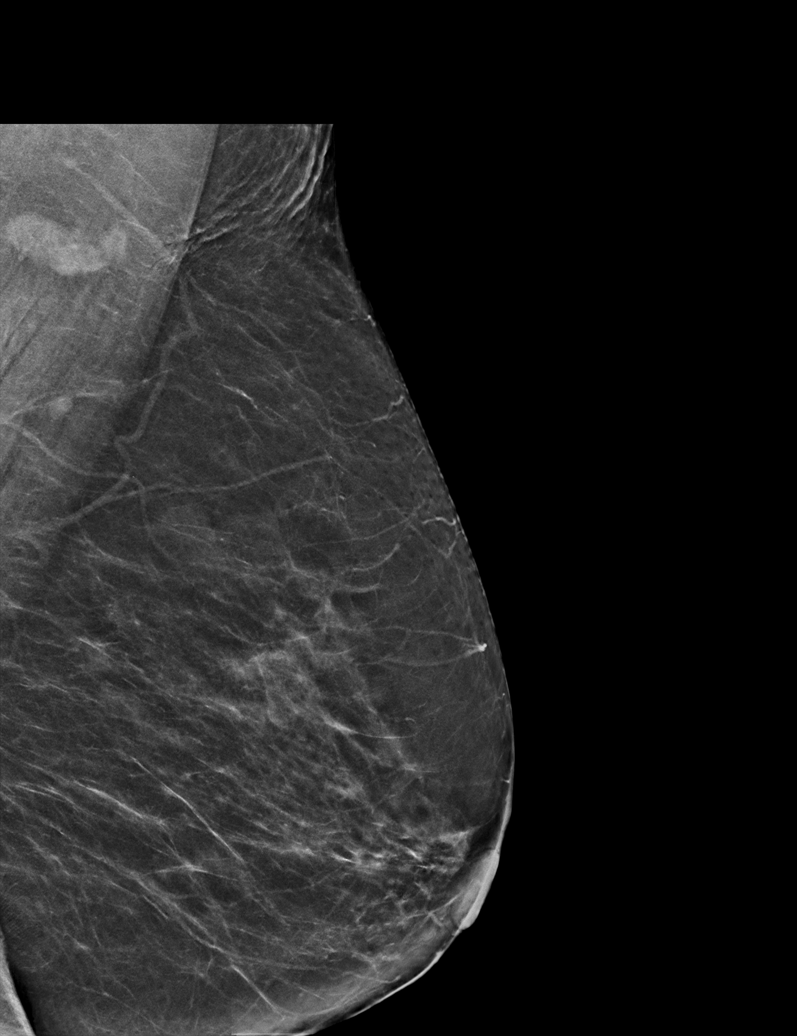

[L CC synth-2D]
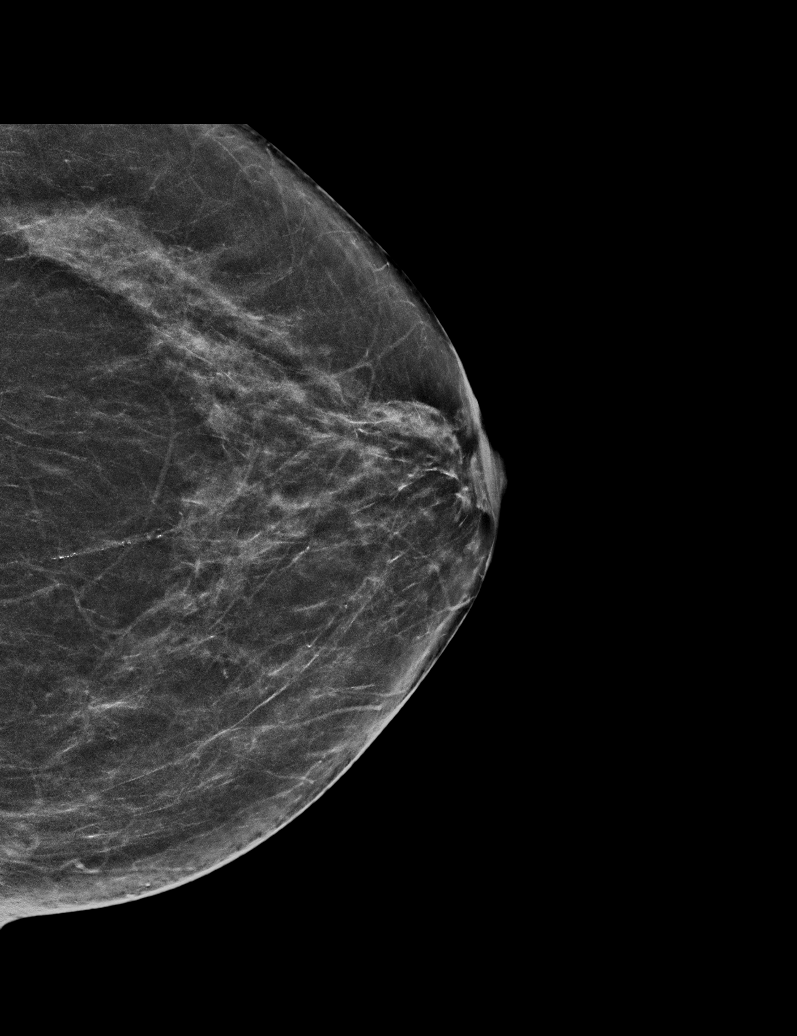

[R MLO synth-2D]
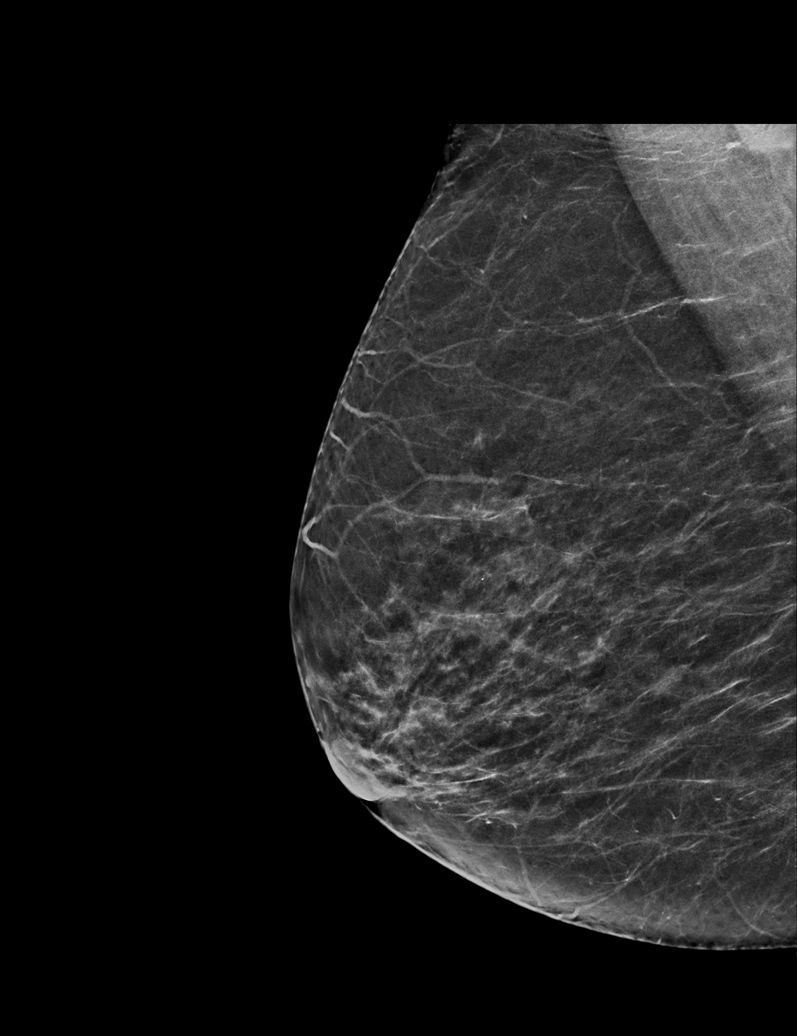

[L CC tomo · tomo slice 29/58.0]
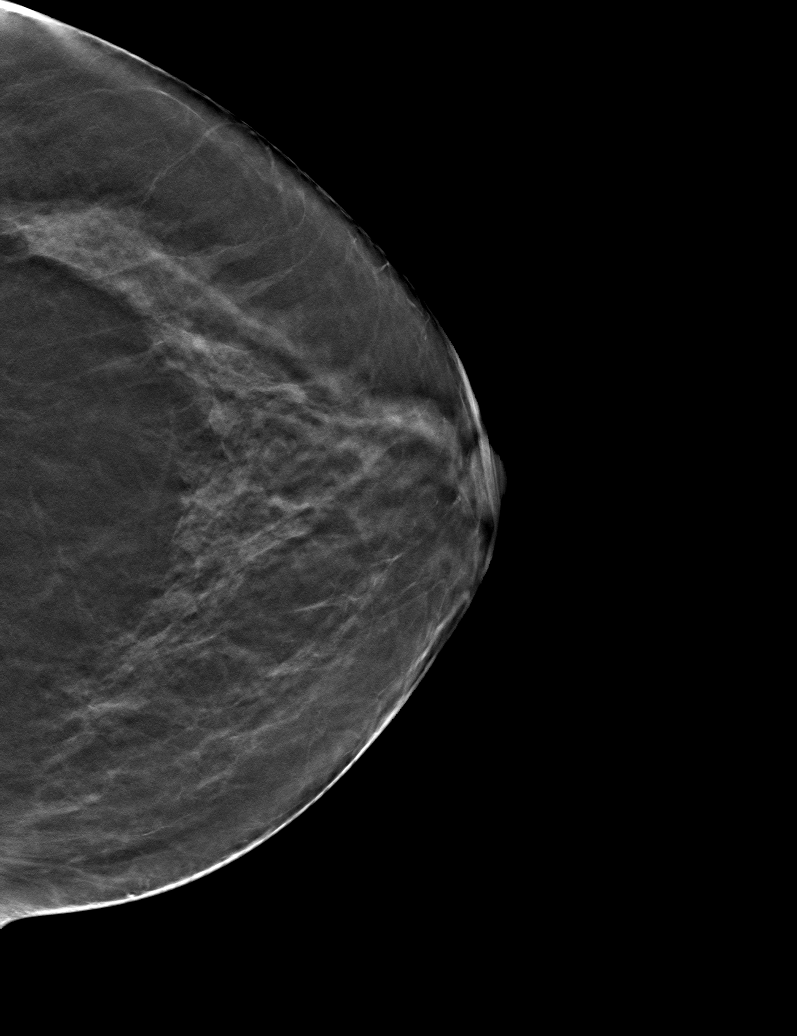

[6 of 30 positions shown; findings below may reference images not displayed]

ACR Breast Density Category b: There are scattered areas of
fibroglandular density.
FINDINGS: There are no findings suspicious for malignancy. Images were
processed with CAD.
IMPRESSION: No mammographic evidence of malignancy. A result letter of this
screening mammogram will be mailed directly to the patient.

RECOMMENDATION:
Screening mammogram in one year. (Code:CN-U-775)

BI-RADS CATEGORY  1: Negative.

## 2020-12-27 ENCOUNTER — Other Ambulatory Visit (HOSPITAL_COMMUNITY): Payer: Self-pay | Admitting: Family Medicine

## 2020-12-27 DIAGNOSIS — M25561 Pain in right knee: Secondary | ICD-10-CM

## 2021-01-01 NOTE — Progress Notes (Signed)
Chief Complaint  Patient presents with  . Pessary Check    stomach pain    Blood pressure 133/78, pulse 75, height 5\' 4"  (1.626 m), weight 194 lb 8 oz (88.2 kg).  Kim [name] presents today for routine follow up related to her pessary.   She uses a Milex ring ith support #5 She reports no vaginal discharge and no vaginal bleeding   Likert scale(1 not bothersome -5 very bothersome)  :  1  Exam reveals no undue vaginal mucosal pressure of breakdown, no discharge and no vaginal bleeding.  Vaginal Epithelial Abnormality Classification System:   o 0    No abnormalities 1    Epithelial erythema 2    Granulation tissue 3    Epithelial break or erosion, 1 cm or less 4    Epithelial break or erosion, 1 cm or greater  The pessary is removed, cleaned and replaced without difficulty.      ICD-10-CM   1. Pessary maintenance, Milex ring with support #5  Z46.89   2. Uterovaginal prolapse, incomplete  N81.2      [name] will be sen back in 4 months for continued follow up.  Dallie Dad, MD  01/01/2021 1:29 PM

## 2021-01-04 ENCOUNTER — Other Ambulatory Visit: Payer: Self-pay

## 2021-01-04 ENCOUNTER — Ambulatory Visit (HOSPITAL_COMMUNITY)
Admission: RE | Admit: 2021-01-04 | Discharge: 2021-01-04 | Disposition: A | Discharge status: Home / Self Care | Payer: Medicare Other | Source: Ambulatory Visit | Attending: Family Medicine | Admitting: Family Medicine

## 2021-01-04 DIAGNOSIS — M25562 Pain in left knee: Secondary | ICD-10-CM | POA: Diagnosis present

## 2021-01-04 DIAGNOSIS — M25561 Pain in right knee: Secondary | ICD-10-CM | POA: Diagnosis not present

## 2021-01-05 ENCOUNTER — Encounter (HOSPITAL_COMMUNITY): Payer: Self-pay | Admitting: Emergency Medicine

## 2021-01-05 ENCOUNTER — Other Ambulatory Visit: Payer: Self-pay

## 2021-01-05 ENCOUNTER — Emergency Department (HOSPITAL_COMMUNITY)
Admission: EM | Admit: 2021-01-05 | Discharge: 2021-01-05 | Disposition: A | Discharge status: Home / Self Care | Payer: Medicare Other | Attending: Emergency Medicine | Admitting: Emergency Medicine

## 2021-01-05 DIAGNOSIS — Z5321 Procedure and treatment not carried out due to patient leaving prior to being seen by health care provider: Secondary | ICD-10-CM | POA: Insufficient documentation

## 2021-01-05 DIAGNOSIS — R1084 Generalized abdominal pain: Secondary | ICD-10-CM | POA: Diagnosis not present

## 2021-01-05 DIAGNOSIS — R112 Nausea with vomiting, unspecified: Secondary | ICD-10-CM | POA: Diagnosis not present

## 2021-01-05 LAB — URINALYSIS, ROUTINE W REFLEX MICROSCOPIC
Bilirubin Urine: NEGATIVE
Glucose, UA: NEGATIVE mg/dL
Ketones, ur: NEGATIVE mg/dL
Nitrite: NEGATIVE
Protein, ur: 30 mg/dL — AB
Specific Gravity, Urine: 1.021 (ref 1.005–1.030)
Squamous Epithelial / HPF: >50 — ABNORMAL HIGH (ref 0–5)
Trans Epithel, UA: 2
WBC, UA: >50 WBC/hpf — ABNORMAL HIGH (ref 0–5)
pH: 5 (ref 5.0–8.0)

## 2021-01-05 LAB — COMPREHENSIVE METABOLIC PANEL
ALT: 10 U/L (ref 0–44)
AST: 21 U/L (ref 15–41)
Albumin: 3.7 g/dL (ref 3.5–5.0)
Alkaline Phosphatase: 67 U/L (ref 38–126)
Anion gap: 10 (ref 5–15)
BUN: 21 mg/dL (ref 8–23)
CO2: 26 mmol/L (ref 22–32)
Calcium: 9 mg/dL (ref 8.9–10.3)
Chloride: 97 mmol/L — ABNORMAL LOW (ref 98–111)
Creatinine, Ser: 0.87 mg/dL (ref 0.44–1.00)
GFR, Estimated: >60 mL/min (ref >60)
Glucose, Bld: 104 mg/dL — ABNORMAL HIGH (ref 70–99)
Potassium: 3.4 mmol/L — ABNORMAL LOW (ref 3.5–5.1)
Sodium: 133 mmol/L — ABNORMAL LOW (ref 135–145)
Total Bilirubin: 0.7 mg/dL (ref 0.3–1.2)
Total Protein: 7.9 g/dL (ref 6.5–8.1)

## 2021-01-05 LAB — CBC
HCT: 46.6 % — ABNORMAL HIGH (ref 36.0–46.0)
Hemoglobin: 14.5 g/dL (ref 12.0–15.0)
MCH: 28.7 pg (ref 26.0–34.0)
MCHC: 31.1 g/dL (ref 30.0–36.0)
MCV: 92.3 fL (ref 80.0–100.0)
Platelets: 231 10*3/uL (ref 150–400)
RBC: 5.05 MIL/uL (ref 3.87–5.11)
RDW: 13.8 % (ref 11.5–15.5)
WBC: 10.1 10*3/uL (ref 4.0–10.5)
nRBC: 0 % (ref 0.0–0.2)

## 2021-01-05 LAB — LIPASE, BLOOD: Lipase: 89 U/L — ABNORMAL HIGH (ref 11–51)

## 2021-01-05 NOTE — ED Triage Notes (Signed)
Pt c/o generalized abdominal pain with n/v that began on Tuesday.  Pt denies known covid exposure and urinary symptoms.

## 2021-01-09 ENCOUNTER — Encounter: Payer: Self-pay | Admitting: Emergency Medicine

## 2021-01-09 ENCOUNTER — Ambulatory Visit
Admission: EM | Admit: 2021-01-09 | Discharge: 2021-01-09 | Disposition: A | Discharge status: Home / Self Care | Payer: Medicare Other | Attending: Emergency Medicine | Admitting: Emergency Medicine

## 2021-01-09 ENCOUNTER — Ambulatory Visit (INDEPENDENT_AMBULATORY_CARE_PROVIDER_SITE_OTHER): Payer: Medicare Other

## 2021-01-09 ENCOUNTER — Other Ambulatory Visit: Payer: Self-pay

## 2021-01-09 ENCOUNTER — Ambulatory Visit: Payer: Medicare Other | Admitting: Obstetrics & Gynecology

## 2021-01-09 DIAGNOSIS — R1084 Generalized abdominal pain: Secondary | ICD-10-CM | POA: Insufficient documentation

## 2021-01-09 DIAGNOSIS — R140 Abdominal distension (gaseous): Secondary | ICD-10-CM | POA: Insufficient documentation

## 2021-01-09 DIAGNOSIS — K5900 Constipation, unspecified: Secondary | ICD-10-CM | POA: Insufficient documentation

## 2021-01-09 DIAGNOSIS — R109 Unspecified abdominal pain: Secondary | ICD-10-CM

## 2021-01-09 LAB — POCT URINALYSIS DIP (MANUAL ENTRY)
Bilirubin, UA: NEGATIVE
Blood, UA: NEGATIVE
Glucose, UA: NEGATIVE mg/dL
Ketones, POC UA: NEGATIVE mg/dL
Nitrite, UA: NEGATIVE
Protein Ur, POC: NEGATIVE mg/dL
Spec Grav, UA: 1.025 (ref 1.010–1.025)
Urobilinogen, UA: 2 E.U./dL — AB
pH, UA: 5.5 (ref 5.0–8.0)

## 2021-01-09 MED ORDER — MAGNESIUM CITRATE PO SOLN: 1.0000 | Freq: Once | ORAL | 0 refills | Status: AC

## 2021-01-09 MED ORDER — ONDANSETRON 4 MG PO TBDP: 4.0000 mg | ORAL_TABLET | Freq: Three times a day (TID) | ORAL | 0 refills | Status: DC | PRN

## 2021-01-09 NOTE — ED Provider Notes (Addendum)
[place]   TZ:[id number] 01/09/21 Arrival Time: 1005  CC: ABDOMINAL DISCOMFORT  SUBJECTIVE:  Kim [name] is a 80 y.o. female who presented to the urgent care with a complaint of abdominal pain that radiated that started this past Tuesday.  Denies any precipitating event.  Localizes pain to lower abdomen.  Describes as intermittent and aching character.  Has tried OTC constipation without relief.  Denies alleviating or aggravating factors.  Denies similar symptoms in the past.  Last BM: 01/04/2021. Denies fever, chills, appetite changes, weight changes, nausea, vomiting, chest pain, SOB, diarrhea,  hematochezia, melena, dysuria, difficulty urinating, increased frequency or urgency, flank pain, loss of bowel or bladder function, vaginal discharge, vaginal odor, vaginal bleeding, dyspareunia, pelvic pain.     No LMP recorded. Patient is postmenopausal.  ROS: As per HPI.  All other pertinent ROS negative.     Past Medical History:  Diagnosis Date  . Acid reflux   . Arthritis   . Back spasm   . Hypertension   . Rheumatoid arthritis [facility])    Past Surgical History:  Procedure Laterality Date  . KNEE SURGERY    . TUBAL LIGATION     Allergies  Allergen Reactions  . Tramadol Hives and Nausea And Vomiting   No current facility-administered medications on file prior to encounter.   Current Outpatient Medications on File Prior to Encounter  Medication Sig Dispense Refill  . Acetaminophen (TYLENOL ARTHRITIS PAIN PO) Take 1 tablet by mouth daily as needed (arthritis).     Marland Kitchen albuterol (PROVENTIL HFA;VENTOLIN HFA) 108 (90 Base) MCG/ACT inhaler Inhale 1-2 puffs into the lungs every 6 (six) hours as needed for wheezing or shortness of breath. 1 Inhaler 0  . ASPERCREME LIDOCAINE EX Apply 1 application topically daily.    . Cholecalciferol (VITAMIN D3) 50 MCG (2000 UT) capsule Take 2,000 Units by mouth daily.    . potassium chloride (MICRO-K) 10 MEQ CR capsule Take 10 mEq by mouth  daily.    Marland Kitchen triamterene-hydrochlorothiazide (MAXZIDE-25) 37.5-25 MG tablet Take 1 tablet by mouth daily.      Social History   Socioeconomic History  . Marital status: Divorced    Spouse name: Not on file  . Number of children: 8  . Years of education: Not on file  . Highest education level: Not on file  Occupational History  . Not on file  Tobacco Use  . Smoking status: Never Smoker  . Smokeless tobacco: Never Used  Vaping Use  . Vaping Use: Never used  Substance and Sexual Activity  . Alcohol use: No  . Drug use: No  . Sexual activity: Not Currently    Birth control/protection: Surgical    Comment: tubal  Other Topics Concern  . Not on file  Social History Narrative  . Not on file   Social Determinants of Health   Financial Resource Strain: Not on file  Food Insecurity: Not on file  Transportation Needs: Not on file  Physical Activity: Not on file  Stress: Not on file  Social Connections: Not on file  Intimate Partner Violence: Not on file   Family History  Problem Relation Age of Onset  . Heart failure Mother   . Stroke Mother   . Heart failure Father   . Stroke Father   . Hypertension Sister      OBJECTIVE:  Vitals:   01/09/21 1034 01/09/21 1035  BP: 124/75   Pulse: 87   Resp: 18   Temp: 98.2 F (36.8 C)  TempSrc: Oral   SpO2: 96%   Weight:  194 lb (88 kg)  Height:  5\' 4"  (1.626 m)    Physical Exam Vitals and nursing note reviewed.  Constitutional:      General: She is not in acute distress.    Appearance: Normal appearance. She is normal weight. She is not ill-appearing, toxic-appearing or diaphoretic.  HENT:     Head: Normocephalic.  Cardiovascular:     Rate and Rhythm: Normal rate and regular rhythm.     Pulses: Normal pulses.     Heart sounds: Normal heart sounds. No murmur heard. No friction rub. No gallop.   Pulmonary:     Effort: Pulmonary effort is normal. No respiratory distress.     Breath sounds: Normal breath sounds. No  stridor. No wheezing, rhonchi or rales.  Chest:     Chest wall: No tenderness.  Abdominal:     General: Abdomen is flat. Bowel sounds are normal. There is distension.     Tenderness: There is generalized abdominal tenderness. There is no right CVA tenderness, left CVA tenderness, guarding or rebound.     Hernia: No hernia is present.  Neurological:     Mental Status: She is alert and oriented to person, place, and time.     LABS: Results for orders placed or performed during the hospital encounter of 01/09/21 (from the past 24 hour(s))  POCT urinalysis dipstick     Status: Abnormal   Collection Time: 01/09/21 10:51 AM  Result Value Ref Range   Color, UA yellow yellow   Clarity, UA clear clear   Glucose, UA negative negative mg/dL   Bilirubin, UA negative negative   Ketones, POC UA negative negative mg/dL   Spec Grav, UA 1.025 1.010 - 1.025   Blood, UA negative negative   pH, UA 5.5 5.0 - 8.0   Protein Ur, POC negative negative mg/dL   Urobilinogen, UA 2.0 (A) 0.2 or 1.0 E.U./dL   Nitrite, UA Negative Negative   Leukocytes, UA Small (1+) (A) Negative    DIAGNOSTIC STUDIES: DG Abdomen 1 View  Result Date: 01/09/2021 CLINICAL DATA:  80 year old female with bloating and abdominal pain EXAM: ABDOMEN - 1 VIEW COMPARISON:  CT 02/26/2017 FINDINGS: Distended loops of small bowel within the mid upper abdomen with no air-fluid level identified. Relative paucity of gas within small bowel distal to this segment. Mild to moderate formed stool burden within the colon which is nondistended. Pelvic pessary. Degenerative changes of the spine. IMPRESSION: Distended small bowel loop within the mid abdomen, potentially a sentinel loop in the setting of bowel obstruction. If there is concern for acute abnormality, contrast-enhanced abdominal CT may be indicated. Mild stool burden. Electronically Signed   By: Corrie Mckusick D.O.   On: 01/09/2021 12:12     X-ray is positive for possible bowel  obstruction.  Further imaging review with CT scan is indicated.  I have reviewed the x-ray myself and the radiologist interpretation.  I am in agreement with the radiologist interpretation.  ASSESSMENT & PLAN:  1. Generalized abdominal pain   2. Abdominal bloating   3. Constipation, unspecified constipation type     Meds ordered this encounter  Medications  . magnesium citrate SOLN    Sig: Take 296 mLs (1 Bottle total) by mouth once for 1 dose.    Dispense:  195 mL    Refill:  0  . ondansetron (ZOFRAN ODT) 4 MG disintegrating tablet    Sig: Take 1 tablet (4 mg total)  by mouth every 8 (eight) hours as needed for nausea or vomiting.    Dispense:  20 tablet    Refill:  0   Patient is stable at discharge.  Magnesium citrate was prescribed with Zofran.  Declined ED follow up for CT scan . She was advised to go to the ED for further evaluation if symptom does not improve  Discharge instructions  Recommend increasing water intake.  Drink at least half your body weight in ounces Increased fiber rich foods in diet (see hand-out) Magnesium citrate prescribed.  Take as directed and to completion Follow up with PCP if symptoms persists Go to the ED if you have any new or worsening symptoms such as increased abdominal pain, nausea, vomiting, if you go 3-4 days without bowel movement, chest pain, shortness of breath, abdomen feels hard or distended, etc... Get rest and drink fluids Zofran prescribed.  Take as directed.     If you experience new or worsening symptoms return or go to ER such as fever, chills, nausea, vomiting, diarrhea, bloody or dark tarry stools, constipation, urinary symptoms, worsening abdominal discomfort, symptoms that do not improve with medications, inability to keep fluids down, etc...  Reviewed expectations re: course of current medical issues. Questions answered. Outlined signs and symptoms indicating need for more acute intervention. Patient verbalized  understanding. After Visit Summary given.   Emerson Monte, FNP 01/09/21 1231    Emerson Monte, FNP 01/09/21 [phone]

## 2021-01-09 NOTE — Discharge Instructions (Addendum)
Recommend increasing water intake.  Drink at least half your body weight in ounces Increased fiber rich foods in diet (see hand-out) Magnesium citrate prescribed.  Take as directed and to completion Follow up with PCP if symptoms persists Go to the ED if you have any new or worsening symptoms such as increased abdominal pain, nausea, vomiting, if you go 3-4 days without bowel movement, chest pain, shortness of breath, abdomen feels hard or distended, etc... Get rest and drink fluids Zofran prescribed.  Take as directed.     If you experience new or worsening symptoms return or go to ER such as fever, chills, nausea, vomiting, diarrhea, bloody or dark tarry stools, constipation, urinary symptoms, worsening abdominal discomfort, symptoms that do not improve with medications, inability to keep fluids down, etc..

## 2021-01-09 NOTE — ED Triage Notes (Signed)
abd pain that radiates around to right side then to back since Tuesday last week.  Took a laxative to have a bowel movement on Wednesday  With results and did not help the abd pain.  Nauseated.

## 2021-01-11 LAB — URINE CULTURE

## 2021-01-23 ENCOUNTER — Ambulatory Visit (INDEPENDENT_AMBULATORY_CARE_PROVIDER_SITE_OTHER): Payer: Medicare Other | Admitting: Obstetrics & Gynecology

## 2021-01-23 ENCOUNTER — Encounter: Payer: Self-pay | Admitting: Obstetrics & Gynecology

## 2021-01-23 ENCOUNTER — Other Ambulatory Visit: Payer: Self-pay

## 2021-01-23 DIAGNOSIS — Z4689 Encounter for fitting and adjustment of other specified devices: Secondary | ICD-10-CM

## 2021-01-23 DIAGNOSIS — N812 Incomplete uterovaginal prolapse: Secondary | ICD-10-CM | POA: Diagnosis not present

## 2021-01-23 NOTE — Progress Notes (Signed)
Chief Complaint  Patient presents with  . Pessary Check    There were no vitals taken for this visit.  Kim [name] presents today for routine follow up related to her pessary.   She uses a Milex ring with support #5 She reports no vaginal discharge and no vaginal bleeding   Likert scale(1 not bothersome -5 very bothersome)  :  1  Exam reveals no undue vaginal mucosal pressure of breakdown, no discharge and no vaginal bleeding.  Vaginal Epithelial Abnormality Classification System:   0 0    No abnormalities 1    Epithelial erythema 2    Granulation tissue 3    Epithelial break or erosion, 1 cm or less 4    Epithelial break or erosion, 1 cm or greater  The pessary is removed, cleaned and replaced without difficulty.    No diagnosis found.   [name] will be sen back in 4 months for continued follow up.  Florian Buff, MD  01/23/2021 12:16 PM

## 2021-02-13 ENCOUNTER — Emergency Department (HOSPITAL_COMMUNITY): Payer: Medicare Other

## 2021-02-13 ENCOUNTER — Other Ambulatory Visit: Payer: Self-pay

## 2021-02-13 ENCOUNTER — Emergency Department (HOSPITAL_COMMUNITY)
Admission: EM | Admit: 2021-02-13 | Discharge: 2021-02-13 | Disposition: A | Discharge status: Home / Self Care | Payer: Medicare Other | Attending: Emergency Medicine | Admitting: Emergency Medicine

## 2021-02-13 ENCOUNTER — Encounter (HOSPITAL_COMMUNITY): Payer: Self-pay

## 2021-02-13 DIAGNOSIS — S92332A Displaced fracture of third metatarsal bone, left foot, initial encounter for closed fracture: Secondary | ICD-10-CM | POA: Diagnosis not present

## 2021-02-13 DIAGNOSIS — M25562 Pain in left knee: Secondary | ICD-10-CM | POA: Diagnosis not present

## 2021-02-13 DIAGNOSIS — S99922A Unspecified injury of left foot, initial encounter: Secondary | ICD-10-CM | POA: Diagnosis present

## 2021-02-13 DIAGNOSIS — S92302A Fracture of unspecified metatarsal bone(s), left foot, initial encounter for closed fracture: Secondary | ICD-10-CM

## 2021-02-13 DIAGNOSIS — S92352A Displaced fracture of fifth metatarsal bone, left foot, initial encounter for closed fracture: Secondary | ICD-10-CM | POA: Diagnosis not present

## 2021-02-13 DIAGNOSIS — Y92019 Unspecified place in single-family (private) house as the place of occurrence of the external cause: Secondary | ICD-10-CM | POA: Insufficient documentation

## 2021-02-13 DIAGNOSIS — S92342A Displaced fracture of fourth metatarsal bone, left foot, initial encounter for closed fracture: Secondary | ICD-10-CM | POA: Insufficient documentation

## 2021-02-13 DIAGNOSIS — W07XXXA Fall from chair, initial encounter: Secondary | ICD-10-CM | POA: Insufficient documentation

## 2021-02-13 DIAGNOSIS — Y92009 Unspecified place in unspecified non-institutional (private) residence as the place of occurrence of the external cause: Secondary | ICD-10-CM

## 2021-02-13 DIAGNOSIS — S92312A Displaced fracture of first metatarsal bone, left foot, initial encounter for closed fracture: Secondary | ICD-10-CM | POA: Insufficient documentation

## 2021-02-13 DIAGNOSIS — I10 Essential (primary) hypertension: Secondary | ICD-10-CM | POA: Diagnosis not present

## 2021-02-13 MED ORDER — ACETAMINOPHEN 325 MG PO TABS
650.0000 mg | ORAL_TABLET | Freq: Once | ORAL | Status: AC
  Administered 2021-02-13: 650 mg via ORAL
  Filled 2021-02-13: qty 2

## 2021-02-13 NOTE — ED Notes (Signed)
Discharge instructions reviewed with daughter Thayer Headings whom is on the way to pick up patient.

## 2021-02-13 NOTE — ED Triage Notes (Signed)
Patient reports going to sit in chair to put stockings on and fell to floor. Denies LOC, did not hit head. Complaining of left ankle pain, mild swelling noted.

## 2021-02-13 NOTE — Discharge Instructions (Addendum)
Your x-rays today show that you have several broken bones in your left foot. It is important that you do not bear weight to your left foot. You have been prescribed a wheelchair to use at home. You can fill this prescription at [facility] Call the orthopedic provider listed to arrange a follow-up appointment for this week. Elevate your left foot when possible. Continue taking your Tylenol every 4-6 hours as needed for pain.

## 2021-02-13 NOTE — ED Provider Notes (Signed)
[facility] EMERGENCY DEPARTMENT Provider Note   CSN: [id number] Arrival date & time: 02/13/21  0920     History Chief Complaint  Patient presents with   Lytle Michaels    Kim [name] is a 80 y.o. female.  HPI      Kim [name] is a 80 y.o. female who presents to the Emergency Department from home complaining of pain to her left knee, foot, ankle and hip.  States that she was sitting in a chair this morning putting on her stockings when she fell out of the chair.  States that she landed on her hip and somehow twisted her left foot.  She was able to get herself up from the floor without assistance and called EMS.  She denies head injury or LOC.  No headache, visual changes, dizziness or neck pain.  She describes aching pain to the top of her foot and medial ankle that worsens with weightbearing.  Pain to her left knee as well.  She reports history of arthritis to multiple joints, but foot and ankle pain are worse than usual.  She also complains of pain to her lower back but states that she has a history of chronic back pain.  No preceding or alleviating factors.  She does not take anticoagulants.   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    Past Surgical History:  Procedure Laterality Date   KNEE SURGERY     TUBAL LIGATION       OB History    Gravida  8   Para  8   Term  8   Preterm      AB      Living  39     SAB      IAB      Ectopic      Multiple      Live Births  8           Family History  Problem Relation Age of Onset   Heart failure Mother    Stroke Mother    Heart failure Father    Stroke Father    Hypertension Sister      Social History   Tobacco Use   Smoking status: Never Smoker   Smokeless tobacco: Never Used  Vaping Use   Vaping Use: Never used  Substance Use Topics   Alcohol use: No   Drug use: No    Home Medications Prior to Admission medications   Medication Sig Start Date End Date Taking? Authorizing Provider  Acetaminophen (TYLENOL ARTHRITIS PAIN PO) Take 1 tablet by mouth daily as needed (arthritis).     [provider]  albuterol (PROVENTIL HFA;VENTOLIN HFA) 108 (90 Base) MCG/ACT inhaler Inhale 1-2 puffs into the lungs every 6 (six) hours as needed for wheezing or shortness of breath. 03/05/18   Long, Wonda Olds, MD  ASPERCREME LIDOCAINE EX Apply 1 application topically daily.    [provider]  Cholecalciferol (VITAMIN D3) 50 MCG (2000 UT) capsule Take 2,000 Units by mouth daily.    [provider]  ondansetron (ZOFRAN ODT) 4 MG disintegrating tablet Take 1 tablet (4 mg total) by  mouth every 8 (eight) hours as needed for nausea or vomiting. 01/09/21   Avegno, Darrelyn Hillock, FNP  potassium chloride (MICRO-K) 10 MEQ CR capsule Take 10 mEq by mouth daily. 08/29/20   [provider]  triamterene-hydrochlorothiazide (MAXZIDE-25) 37.5-25 MG tablet Take 1 tablet by mouth daily.  01/09/17   [provider]    Allergies    Tramadol  Review of Systems   Review of Systems  Constitutional: Negative for chills and fever.  Eyes: Negative for visual disturbance.  Respiratory: Negative for shortness of breath.   Cardiovascular: Negative for chest pain.  Gastrointestinal: Negative for abdominal pain, nausea and vomiting.  Genitourinary: Negative for difficulty urinating.  Musculoskeletal: Positive for arthralgias (left knee, foot and ankle pain) and back pain. Negative for joint swelling and neck pain.  Skin: Negative for color change and wound.  Neurological: Negative for dizziness, syncope, weakness, numbness and headaches.    Physical Exam Updated  Vital Signs BP (!) 153/88 (BP Location: Left Arm)    Pulse 93    Temp 98.2 F (36.8 C) (Oral)    Ht 5\' 4"  (1.626 m)    Wt 86.2 kg    SpO2 99%    BMI 32.61 kg/m   Physical Exam Vitals and nursing note reviewed.  Constitutional:      General: She is not in acute distress.    Appearance: Normal appearance. She is not ill-appearing.  HENT:     Head: Atraumatic.     Comments: No tenderness of the scalp or palpable hematomas    Mouth/Throat:     Mouth: Mucous membranes are moist.  Eyes:     Extraocular Movements: Extraocular movements intact.     Conjunctiva/sclera: Conjunctivae normal.     Pupils: Pupils are equal, round, and reactive to light.  Cardiovascular:     Rate and Rhythm: Normal rate and regular rhythm.     Pulses: Normal pulses.  Pulmonary:     Effort: Pulmonary effort is normal.     Breath sounds: Normal breath sounds.  Chest:     Chest wall: No tenderness.  Abdominal:     Palpations: Abdomen is soft.     Tenderness: There is no abdominal tenderness.  Musculoskeletal:        General: Swelling, tenderness and signs of injury present.     Cervical back: Normal range of motion. No tenderness.     Right lower leg: No edema.     Left lower leg: No edema.     Comments: ttp of the dorsal left foot, mild edema noted.  Tender to medial left ankle and anterior left knee.  Neg Lachman's test.  No edema or palpable effusion of the knee  Skin:    General: Skin is warm.     Capillary Refill: Capillary refill takes less than 2 seconds.     Findings: No rash.  Neurological:     General: No focal deficit present.     Mental Status: She is alert.     Sensory: No sensory deficit.     Motor: No weakness.     ED Results / Procedures / Treatments   Labs (all labs ordered are listed, but only abnormal results are displayed) Labs Reviewed - No data to display  EKG None  Radiology DG Pelvis 1-2 Views  Result Date: 02/13/2021 CLINICAL DATA:  Pain following fall EXAM: PELVIS -  1-2 VIEW COMPARISON:  None. FINDINGS: There is no evidence of pelvic fracture or dislocation. There is moderately severe  narrowing of each hip joint. There is degenerative change in the pubic symphysis region. There is symmetric bony overgrowth along each superolateral acetabulum. No erosive change. IMPRESSION: No acute fracture or dislocation. Symmetric moderately severe narrowing of each hip joint. Bony overgrowth along each superolateral acetabulum potentially places patient at increased risk for femoroacetabular impingement. Electronically Signed   By: Lowella Grip III M.D.   On: 02/13/2021 10:14   DG Ankle Complete Left  Result Date: 02/13/2021 CLINICAL DATA:  Pain following fall EXAM: LEFT ANKLE COMPLETE - 3+ VIEW COMPARISON:  Left foot radiographs February 13, 2021 FINDINGS: Frontal, oblique, and lateral views were obtained. Fractures of the proximal first, third, fourth, and fifth metatarsals described in the foot report. Fracture of second midportion metatarsal described in the foot report. In the ankle region, there is no appreciable fracture or joint effusion. There is mild generalized osteoarthritic change. There is a benign exostosis arising from the dorsal distal talus. There is pes planus. There is generalized soft tissue swelling. IMPRESSION: Soft tissue swelling. Osteoporosis. No fracture in the ankle region. Mild osteoarthritic change. Pes planus. Multiple metatarsal fractures are described in the left foot report. Electronically Signed   By: Lowella Grip III M.D.   On: 02/13/2021 10:22   DG Knee Complete 4 Views Left  Result Date: 02/13/2021 CLINICAL DATA:  Pain following fall EXAM: LEFT KNEE - COMPLETE 4+ VIEW COMPARISON:  January 04, 2021 FINDINGS: Frontal, lateral, and bilateral oblique views were obtained. There is no fracture or dislocation. There is no appreciable joint effusion. There is moderate narrowing of the patellofemoral joint with slightly milder narrowing medially.  There is mild spurring in all compartments. No erosive change. IMPRESSION: Osteoarthritic change with narrowing most notable in the patellofemoral joint. No fracture, dislocation, or joint effusion. Electronically Signed   By: Lowella Grip III M.D.   On: 02/13/2021 10:16   DG Foot Complete Left  Result Date: 02/13/2021 CLINICAL DATA:  Pain following fall EXAM: LEFT FOOT - COMPLETE 3+ VIEW COMPARISON:  August 20, 2014 FINDINGS: Frontal, oblique, and lateral views were obtained. There is generalized soft tissue swelling. There are fractures of the first, third, fourth, and fifth proximal metatarsal metaphyses with slight displacement of fracture fragments in these areas. There is mild impaction along the lateral aspect of the fifth proximal metacarpal fracture. There is a comminuted fracture through the midportion the second metatarsal with lateral displacement of the distal major fracture fragment with respect to the proximal fragment with approximately 9 mm of displacement of major fracture fragments in this area. There are fractures of the distal third and fourth metatarsals with volar angulation distally at the sites. There is underlying osteoporosis. There is no frank dislocation. There is no significant joint space narrowing. There is pes planus. IMPRESSION: 1. Mildly displaced fractures of the first, third, fourth, and fifth metatarsals at the proximal metaphysis level at each site. Mild impaction noted along the lateral aspect of the fifth metatarsal fracture. 2. Comminuted obliquely oriented fracture through the midportion of the second metatarsal with 9 mm lateral displacement of the distal major fracture fragment with respect to the major proximal fragment. 3. Fractures of the distal third and fourth metatarsals with volar angulation distally in these areas. 4.  Pes planus. 5.  Osteoporosis. Electronically Signed   By: Lowella Grip III M.D.   On: 02/13/2021 10:21    Procedures Procedures    Medications Ordered in ED Medications - No data to display  ED Course  I have  reviewed the triage vital signs and the nursing notes.  Pertinent labs & imaging results that were available during my care of the patient were reviewed by me and considered in my medical decision making (see chart for details).  Clinical Course as of 02/13/21 1005  Mon Feb 13, 2021  1000 Kim [name] 80 year old female here for left leg pain after falling from a seated position.  Complains primarily of left ankle pain.  Neurovascular intact.  Getting x-rays.  Disposition per results of testing. [MB]    Clinical Course User Index [MB] Hayden Rasmussen, MD   MDM Rules/Calculators/A&P                          Pt here for pain to left foot, ankle, knee secondary to a fall from a seated position.  Pain to dorsal foot.  No reported head injury or LOC.  On exam, edema of dorsal foot, no open wounds.  NV intact.  XR's obtained, multiple MT fx's seen.  XR's of ankle, knee, pelvis w/o acute findings.    Pt lives at home with grandson and has home health aid 5 days/wk.  Offered admission, but prefers d/c home.  I will prescribe wheelchair as she will need to be non weight bearing until seen by orthopedics.  Pt verbalized understanding and agrees to plan.    1150 consulted Dr. Frances Furbish with orthopedics.  He recommends splint and he will see patient this week for follow-up.   SPLINT APPLICATION Date/Time: 0:09 PM Authorized by: Traniyah Hallett Consent: Verbal consent obtained. Risks and benefits: risks, benefits and alternatives were discussed Consent given by: patient Splint applied by: nursing Location details: left foot Splint type: short leg posterior splint Supplies used: ortho glass, ACE wrap, padding  Post-procedure: The splinted body part was neurovascularly unchanged following the procedure. Patient tolerance: Patient tolerated the procedure well with no immediate complications.   Final Clinical  Impression(s) / ED Diagnoses Final diagnoses:  Multiple closed fractures of metatarsal bone of left foot, initial encounter  Fall at home, initial encounter    Rx / DC Orders ED Discharge Orders    None       Kem Parkinson, PA-C 02/14/21 1459    Hayden Rasmussen, MD 02/14/21 1731

## 2021-02-15 ENCOUNTER — Ambulatory Visit (INDEPENDENT_AMBULATORY_CARE_PROVIDER_SITE_OTHER): Payer: Medicare Other | Admitting: Orthopedic Surgery

## 2021-02-15 ENCOUNTER — Telehealth: Payer: Self-pay | Admitting: Radiology

## 2021-02-15 ENCOUNTER — Other Ambulatory Visit: Payer: Self-pay

## 2021-02-15 ENCOUNTER — Encounter: Payer: Self-pay | Admitting: Orthopedic Surgery

## 2021-02-15 VITALS — BP 134/84 | HR 109 | Ht 64.0 in | Wt 190.0 lb

## 2021-02-15 DIAGNOSIS — S92355A Nondisplaced fracture of fifth metatarsal bone, left foot, initial encounter for closed fracture: Secondary | ICD-10-CM

## 2021-02-15 DIAGNOSIS — S92345A Nondisplaced fracture of fourth metatarsal bone, left foot, initial encounter for closed fracture: Secondary | ICD-10-CM | POA: Diagnosis not present

## 2021-02-15 DIAGNOSIS — S92315A Nondisplaced fracture of first metatarsal bone, left foot, initial encounter for closed fracture: Secondary | ICD-10-CM

## 2021-02-15 DIAGNOSIS — S92322A Displaced fracture of second metatarsal bone, left foot, initial encounter for closed fracture: Secondary | ICD-10-CM

## 2021-02-15 DIAGNOSIS — W010XXA Fall on same level from slipping, tripping and stumbling without subsequent striking against object, initial encounter: Secondary | ICD-10-CM

## 2021-02-15 DIAGNOSIS — S92335A Nondisplaced fracture of third metatarsal bone, left foot, initial encounter for closed fracture: Secondary | ICD-10-CM | POA: Diagnosis not present

## 2021-02-15 MED ORDER — HYDROCODONE-ACETAMINOPHEN 5-325 MG PO TABS: 1.0000 | ORAL_TABLET | Freq: Four times a day (QID) | ORAL | 0 refills | Status: DC | PRN

## 2021-02-15 NOTE — H&P (View-Only) (Signed)
New Patient Visit  Assessment: Kim [name] is a 80 y.o. female with the following: 1. Closed nondisplaced fracture of first metatarsal bone of left foot, initial encounter; requires operative fixation  2. Closed displaced fracture of second metatarsal bone of left foot, initial encounterl requires operative fixation  3. Closed nondisplaced fracture of third metatarsal bone of left foot, initial encounter 4. Closed nondisplaced fracture of fourth metatarsal bone of left foot, initial encounter 5. Closed nondisplaced fracture of fifth metatarsal bone of left foot, initial encounter  Plan: I reviewed the radiographs with the patient in clinic today.  Specifically, the second metatarsal fracture is significantly displaced.  At the same time, the first metatarsal fracture has limited displacement, but due to the stress through the first metatarsal, I recommended fixation, to include fusion of the first TMT joint.    Risks and benefits of surgery, including, but not limited to infection, bleeding, persistent pain, damage to surrounding structures, need for further surgery, malunion, nonunion and more severe complications associated with anesthesia were discussed.  All questions have been answered and they have elected to proceed with surgery.   She was placed back into a short leg splint, and is expected to keep this clean, dry and intact.  A limited prescription for hydrocodone.  She was given a prescription for a manual wheelchair for use in home for all ADL tasks, including eating toileting, bathing  Patient can self propel  Patient has fractured all 5 metatarsals and she will be non weight bearing 3 to 6 months, and cannot use a walker or cane due to severity of fracture.  Plan to proceed with surgery February 27, 2021  Surgical Plan  Procedure: Operative fixation of first and second metatarsal fractures with fusion of the first TMT joint. Disposition: Outpatient Anesthesia: General with  a block Medical Comorbidities: RA   Follow-up: Return for After surgery.  Subjective:  Chief Complaint  Patient presents with  . Foot Injury    Lt foot multiple metatarsal fx's after fall DOI 02/13/21    History of Present Illness: Kim [name] is a 80 y.o. female who presents for evaluation of her left foot.  She was seen in the emergency department February 14th.  Earlier that day, she was putting on her socks, when she stumbled and fell.  Her left foot was twisted underneath her.  She noted immediate pain and swelling.  She presented for evaluation, was noted to have multiple fractures within her left foot.  She was placed in a splint, and instructed to follow-up in clinic today.  She has been taking Tylenol, but this is not sufficient to control her pain.  She has tolerated the splint well.  She has tried to get a wheelchair, but has had issues with the prescription provided by the emergency department.  He lives at home, with her grandson.  She does have an aide coming to the house on a regular basis to provide some assistance.   Review of Systems: No fevers or chills No numbness or tingling No chest pain No bowel or bladder dysfunction No GI distress No headaches   Medical History:  Past Medical History:  Diagnosis Date  . Acid reflux   . Arthritis   . Back spasm   . Hypertension   . Rheumatoid arthritis [facility])     Past Surgical History:  Procedure Laterality Date  . KNEE SURGERY    . TUBAL LIGATION      Family History  Problem Relation Age of Onset  .  Heart failure Mother   . Stroke Mother   . Heart failure Father   . Stroke Father   . Hypertension Sister    Social History   Tobacco Use  . Smoking status: Never Smoker  . Smokeless tobacco: Never Used  Vaping Use  . Vaping Use: Never used  Substance Use Topics  . Alcohol use: No  . Drug use: No    Allergies  Allergen Reactions  . Tramadol Hives and Nausea And Vomiting    Current Meds   Medication Sig  . Acetaminophen (TYLENOL ARTHRITIS PAIN PO) Take 1 tablet by mouth daily as needed (arthritis).   Marland Kitchen albuterol (PROVENTIL HFA;VENTOLIN HFA) 108 (90 Base) MCG/ACT inhaler Inhale 1-2 puffs into the lungs every 6 (six) hours as needed for wheezing or shortness of breath.  . ASPERCREME LIDOCAINE EX Apply 1 application topically daily.  . Cholecalciferol (VITAMIN D3) 50 MCG (2000 UT) capsule Take 2,000 Units by mouth daily.  Marland Kitchen HYDROcodone-acetaminophen (NORCO/VICODIN) 5-325 MG tablet Take 1 tablet by mouth every 6 (six) hours as needed for moderate pain or severe pain.  Marland Kitchen ondansetron (ZOFRAN ODT) 4 MG disintegrating tablet Take 1 tablet (4 mg total) by mouth every 8 (eight) hours as needed for nausea or vomiting.  . potassium chloride (MICRO-K) 10 MEQ CR capsule Take 10 mEq by mouth daily.  Marland Kitchen triamterene-hydrochlorothiazide (MAXZIDE-25) 37.5-25 MG tablet Take 1 tablet by mouth daily.     Objective: BP 134/84   Pulse (!) 109   Ht 5\' 4"  (1.626 m)   Wt 190 lb (86.2 kg)   BMI 32.61 kg/m   Physical Exam:  General: Alert and oriented.  Seated in wheelchair.  No acute distress. Gait: Unable to ambulate  Evaluation of left foot demonstrates swelling with ecchymosis over the dorsum of her foot.  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  Tenderness palpation throughout the midfoot.  Active motion intact in the TA/EHL.  Sensation is intact distally.  Toes are warm and well perfused.    IMAGING: I personally reviewed images previously obtained from the ED   X-rays left foot were obtained in the emergency department demonstrates multiple fractures through the third and fourth metatarsal shafts including the base of the neck.  The base of the first metatarsal demonstrates a minimally displaced transverse fracture.  The second metatarsal is significantly displaced with a long oblique fracture line.  The base of the fifth metatarsal demonstrates a minimally displaced  fracture.   New Medications:  Meds ordered this encounter  Medications  . HYDROcodone-acetaminophen (NORCO/VICODIN) 5-325 MG tablet    Sig: Take 1 tablet by mouth every 6 (six) hours as needed for moderate pain or severe pain.    Dispense:  20 tablet    Refill:  0      Mordecai Rasmussen, MD  02/15/2021 12:36 PM

## 2021-02-15 NOTE — Progress Notes (Addendum)
New Patient Visit  Assessment: Kim [name] is a 79 y.o. female with the following: 1. Closed nondisplaced fracture of first metatarsal bone of left foot, initial encounter; requires operative fixation  2. Closed displaced fracture of second metatarsal bone of left foot, initial encounterl requires operative fixation  3. Closed nondisplaced fracture of third metatarsal bone of left foot, initial encounter 4. Closed nondisplaced fracture of fourth metatarsal bone of left foot, initial encounter 5. Closed nondisplaced fracture of fifth metatarsal bone of left foot, initial encounter  Plan: I reviewed the radiographs with the patient in clinic today.  Specifically, the second metatarsal fracture is significantly displaced.  At the same time, the first metatarsal fracture has limited displacement, but due to the stress through the first metatarsal, I recommended fixation, to include fusion of the first TMT joint.    Risks and benefits of surgery, including, but not limited to infection, bleeding, persistent pain, damage to surrounding structures, need for further surgery, malunion, nonunion and more severe complications associated with anesthesia were discussed.  All questions have been answered and they have elected to proceed with surgery.   She was placed back into a short leg splint, and is expected to keep this clean, dry and intact.  A limited prescription for hydrocodone.  She was given a prescription for a manual wheelchair for use in home for all ADL tasks, including eating toileting, bathing  Patient can self propel  Patient has fractured all 5 metatarsals and she will be non weight bearing 3 to 6 months, and cannot use a walker or cane due to severity of fracture.  Plan to proceed with surgery February 27, 2021  Surgical Plan  Procedure: Operative fixation of first and second metatarsal fractures with fusion of the first TMT joint. Disposition: Outpatient Anesthesia: General with  a block Medical Comorbidities: RA   Follow-up: Return for After surgery.  Subjective:  Chief Complaint  Patient presents with  . Foot Injury    Lt foot multiple metatarsal fx's after fall DOI 02/13/21    History of Present Illness: Kim [name] is a 79 y.o. female who presents for evaluation of her left foot.  She was seen in the emergency department February 14th.  Earlier that day, she was putting on her socks, when she stumbled and fell.  Her left foot was twisted underneath her.  She noted immediate pain and swelling.  She presented for evaluation, was noted to have multiple fractures within her left foot.  She was placed in a splint, and instructed to follow-up in clinic today.  She has been taking Tylenol, but this is not sufficient to control her pain.  She has tolerated the splint well.  She has tried to get a wheelchair, but has had issues with the prescription provided by the emergency department.  He lives at home, with her grandson.  She does have an aide coming to the house on a regular basis to provide some assistance.   Review of Systems: No fevers or chills No numbness or tingling No chest pain No bowel or bladder dysfunction No GI distress No headaches   Medical History:  Past Medical History:  Diagnosis Date  . Acid reflux   . Arthritis   . Back spasm   . Hypertension   . Rheumatoid arthritis (HCC)     Past Surgical History:  Procedure Laterality Date  . KNEE SURGERY    . TUBAL LIGATION      Family History  Problem Relation Age of Onset  .   Heart failure Mother   . Stroke Mother   . Heart failure Father   . Stroke Father   . Hypertension Sister    Social History   Tobacco Use  . Smoking status: Never Smoker  . Smokeless tobacco: Never Used  Vaping Use  . Vaping Use: Never used  Substance Use Topics  . Alcohol use: No  . Drug use: No    Allergies  Allergen Reactions  . Tramadol Hives and Nausea And Vomiting    Current Meds   Medication Sig  . Acetaminophen (TYLENOL ARTHRITIS PAIN PO) Take 1 tablet by mouth daily as needed (arthritis).   Marland Kitchen albuterol (PROVENTIL HFA;VENTOLIN HFA) 108 (90 Base) MCG/ACT inhaler Inhale 1-2 puffs into the lungs every 6 (six) hours as needed for wheezing or shortness of breath.  . ASPERCREME LIDOCAINE EX Apply 1 application topically daily.  . Cholecalciferol (VITAMIN D3) 50 MCG (2000 UT) capsule Take 2,000 Units by mouth daily.  Marland Kitchen HYDROcodone-acetaminophen (NORCO/VICODIN) 5-325 MG tablet Take 1 tablet by mouth every 6 (six) hours as needed for moderate pain or severe pain.  Marland Kitchen ondansetron (ZOFRAN ODT) 4 MG disintegrating tablet Take 1 tablet (4 mg total) by mouth every 8 (eight) hours as needed for nausea or vomiting.  . potassium chloride (MICRO-K) 10 MEQ CR capsule Take 10 mEq by mouth daily.  Marland Kitchen triamterene-hydrochlorothiazide (MAXZIDE-25) 37.5-25 MG tablet Take 1 tablet by mouth daily.     Objective: BP 134/84   Pulse (!) 109   Ht 5\' 4"  (1.626 m)   Wt 190 lb (86.2 kg)   BMI 32.61 kg/m   Physical Exam:  General: Alert and oriented.  Seated in wheelchair.  No acute distress. Gait: Unable to ambulate  Evaluation of left foot demonstrates swelling with ecchymosis over the dorsum of her foot.  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  Tenderness palpation throughout the midfoot.  Active motion intact in the TA/EHL.  Sensation is intact distally.  Toes are warm and well perfused.    IMAGING: I personally reviewed images previously obtained from the ED   X-rays left foot were obtained in the emergency department demonstrates multiple fractures through the third and fourth metatarsal shafts including the base of the neck.  The base of the first metatarsal demonstrates a minimally displaced transverse fracture.  The second metatarsal is significantly displaced with a long oblique fracture line.  The base of the fifth metatarsal demonstrates a minimally displaced  fracture.   New Medications:  Meds ordered this encounter  Medications  . HYDROcodone-acetaminophen (NORCO/VICODIN) 5-325 MG tablet    Sig: Take 1 tablet by mouth every 6 (six) hours as needed for moderate pain or severe pain.    Dispense:  20 tablet    Refill:  0      Mordecai Rasmussen, MD  02/15/2021 12:36 PM

## 2021-02-15 NOTE — Telephone Encounter (Signed)
   Told them in order she needs  Manual wheelchair for use in home for all ADL tasks, including eating toileting, bathing  Patient can self propel  Patient has fracture foot across all 5 metatarsals and she will be non weight bearing 3 to 6 months, can not use walker or cane due to severity of fracture.   This needs to be in your office note per Jackelyn Poling per [employer]  Can you make addendum and put in there then we can fax?   There is also a form they are faxing that needs to be signed.

## 2021-02-15 NOTE — Telephone Encounter (Signed)
I realized after clinic I forgot to give her the  Order for wheelchair when I put her splint on. I think Barnet Pall gave the order to the patient. I called patient to make sure she was able to get the wheelchair and she took our order by but was not able  To get it  I called [place] and she needs office visit notes and she needs a SWO. I have sent a fax with the office visit notes and a new SWO.    Told them in order she needs  Manual wheelchair for use in home for all ADL tasks, including eating toileting, bathing  Patient can self propel  Patient has fracture foot across all 5 metatarsals and she will be non weight bearing 3 to 6 months, can not use walker or cane due to severity of fracture.   (this is normally what they need)  FYI only

## 2021-02-15 NOTE — Telephone Encounter (Signed)
Done  The note has been updated.  Please let me know if you need anything else.   Kim [name]

## 2021-02-16 NOTE — Telephone Encounter (Signed)
No I got it from here, thanks  Faxed all to [place] at [employer]

## 2021-02-22 ENCOUNTER — Telehealth: Payer: Self-pay | Admitting: Orthopedic Surgery

## 2021-02-22 DIAGNOSIS — S92315A Nondisplaced fracture of first metatarsal bone, left foot, initial encounter for closed fracture: Secondary | ICD-10-CM

## 2021-02-22 NOTE — Patient Instructions (Signed)
Kim [name]  02/22/2021     @PREFPERIOPPHARMACY @   Your procedure is scheduled on  02/27/2021   Report to [facility] at  [place] AM    Call this number if you have problems the morning of surgery:  [phone]   Remember:  Do not eat or drink after midnight.                       Take these medicines the morning of surgery with A SIP OF WATER      Hydrocodone (if needed), zofran (if needed).    Before you go to bed 02/26/2021, change the sheets on your bed. DO NOT sleep wit pets this night.  You have 2 packages of CHG wipes that will clean your skin before surgery. Use one package of these wipes, the night before your surgery, right before bed and use the second pack the morning of your procedure.  Each package has 2 wipes in it. You will use one wipe to wipe down the exposed areas of skin on your broken foot. DO NOT remove any dressings or wraps the doctor has placed on your foot.   Use the second wipe in the pack, to wipe your other leg, arms and torso.   DO NOT use wipes on your face, hair or genitals.     After you wipe down with the wipes, put on clean, comfortable clothes and brush your teeth.     Do not wear jewelry, make-up or nail polish.  Do not wear lotions, powders, or perfumes, or deodorant.  Do not shave 48 hours prior to surgery.  Men may shave face and neck.  Do not bring valuables to the hospital.  [facility] is not responsible for any belongings or valuables.  Contacts, dentures or bridgework may not be worn into surgery.  Leave your suitcase in the car.  After surgery it may be brought to your room.  For patients admitted to the hospital, discharge time will be determined by your treatment team.  Patients discharged the day of surgery will not be allowed to drive home and must have someone with them for 24 hours.   Special instructions:   DO NOT smoke tobacco or vape the morning of your procedure.   Please read over the following fact  sheets that you were given. Coughing and Deep Breathing, Surgical Site Infection Prevention, Anesthesia Post-op Instructions and Care and Recovery After Surgery       Metatarsal Osteotomy, Care After This sheet gives you information about how to care for yourself after your procedure. Your health care provider may also give you more specific instructions. If you have problems or questions, contact your health care provider. What can I expect after the procedure? After the procedure, it is common to have:  Soreness.  Pain.  Stiffness.  Swelling. Follow these instructions at home: Medicines  Take over-the-counter and prescription medicines only as told by your health care provider.  Ask your health care provider if the medicine prescribed to you can cause constipation. You may need to take these actions to prevent or treat constipation: ? Drink enough fluids to keep your urine pale yellow. ? Take over-the-counter or prescription medicines. ? Eat foods that are high in fiber, such as beans, whole grains, and fresh fruits and vegetables. ? Limit foods that are high in fat and processed sugars, such as fried or sweet foods. If you have a splint  or walking boot:  Wear it as told by your health care provider. Remove it only as told by your health care provider.  Loosen it if your toes tingle, become numb, or turn cold and blue.  Keep it clean.  If it is not waterproof: ? Do not let it get wet. ? Cover it with a watertight covering when you take a bath or shower. Bathing  Do not take baths, swim, or use a hot tub until your health care provider approves. Ask your health care provider if you may take showers. You may only be allowed to take sponge baths.  Keep the bandage (dressing) dry. Incision care  Follow instructions from your health care provider about how to take care of your incision. Make sure you: ? Wash your hands with soap and water for at least 20 seconds before and  after you change your bandage. If soap and water are not available, use hand sanitizer. ? Leave stitches (sutures), skin glue, or adhesive strips in place. These skin closures may need to stay in place for 2 weeks or longer. If adhesive strip edges start to loosen and curl up, you may trim the loose edges. Do not remove adhesive strips completely unless your health care provider tells you to do that.  Check your incision area every day for signs of infection. Check for: ? Redness, swelling, or pain. ? Fluid or blood. ? Warmth. ? Pus or a bad smell.   Managing pain, stiffness, and swelling  If directed, put ice on the injured area. To do this: ? If you have a removable splint, remove it as told by your health care provider. ? Put ice in a plastic bag. ? Place a towel between your skin and the bag. ? Leave the ice on for 20 minutes, 2-3 times a day. ? Remove the ice if your skin turns bright red. This is very important. If you cannot feel pain, heat, or cold, you have a greater risk of damage to the area.  Move your toes often to reduce stiffness and swelling.  Raise (elevate) the injured area above the level of your heart while you are sitting or lying down.   Driving  Ask your health care provider if the medicine prescribed to you requires you to avoid driving or using machinery.  If you were given a sedative during the procedure, it can affect you for several hours. Do not drive or operate machinery until your health care provider says that it is safe.  Ask your health care provider when it is safe to drive if you have a dressing, splint, special shoe, or walking boot on your foot. General instructions  Do not remove the dressing around your foot until directed by your health care provider.  If you were given a splint, special shoe, or walking boot, wear it as told by your health care provider.  Do not use the injured limb to support your body weight until your health care provider  says that you can. Use crutches or a walker as told by your health care provider.  Return to your normal activities as told by your health care provider. Ask your health care provider what activities are safe for you.  Do not use any products that contain nicotine or tobacco, such as cigarettes, e-cigarettes, and chewing tobacco. These can delay bone healing. If you need help quitting, ask your health care provider.  Keep all follow-up visits. This is important. Contact a health care provider  if:  You have a fever.  Your dressing becomes wet, loose, or stained with blood or discharge.  You have pus or a bad smell coming from your incision or bandage.  You have pain or stiffness that does not get better or gets worse.  You have tingling or numbness in your foot that does not get better or gets worse. Get help right away if:  Your foot becomes red, swollen, or tender, or you notice redness traveling upward from your incision.  You develop warm and tender swelling in your leg.  You have chest pain.  You have trouble breathing. These symptoms may represent a serious problem that is an emergency. Do not wait to see if the symptoms will go away. Get medical help right away. Call your local emergency services (911 in the [place] Do not drive yourself to the hospital. Summary  After the procedure, it is common to have soreness, pain, stiffness, and swelling.  Follow instructions on caring for your incision. Do not use the injured limb to support your body weight until your health care provider says that you can.  Contact your health care provider if you have a fever, have pus or a bad smell coming from your wound or dressing, or pain and stiffness that does not get better.  Get help right away if you develop a warm and tender swelling in your leg, have chest pain, or trouble breathing. This information is not intended to replace advice given to you by your health care provider. Make sure  you discuss any questions you have with your health care provider. Document Revised: 03/24/2020 Document Reviewed: 03/24/2020 Elsevier Patient Education  2021 [place] Anesthesia, Adult, Care After This sheet gives you information about how to care for yourself after your procedure. Your health care provider may also give you more specific instructions. If you have problems or questions, contact your health care provider. What can I expect after the procedure? After the procedure, the following side effects are common:  Pain or discomfort at the IV site.  Nausea.  Vomiting.  Sore throat.  Trouble concentrating.  Feeling cold or chills.  Feeling weak or tired.  Sleepiness and fatigue.  Soreness and body aches. These side effects can affect parts of the body that were not involved in surgery. Follow these instructions at home: For the time period you were told by your health care provider:  Rest.  Do not participate in activities where you could fall or become injured.  Do not drive or use machinery.  Do not drink alcohol.  Do not take sleeping pills or medicines that cause drowsiness.  Do not make important decisions or sign legal documents.  Do not take care of children on your own.   Eating and drinking  Follow any instructions from your health care provider about eating or drinking restrictions.  When you feel hungry, start by eating small amounts of foods that are soft and easy to digest (bland), such as toast. Gradually return to your regular diet.  Drink enough fluid to keep your urine pale yellow.  If you vomit, rehydrate by drinking water, juice, or clear broth. General instructions  If you have sleep apnea, surgery and certain medicines can increase your risk for breathing problems. Follow instructions from your health care provider about wearing your sleep device: ? Anytime you are sleeping, including during daytime naps. ? While taking  prescription pain medicines, sleeping medicines, or medicines that make you drowsy.  Have a  responsible adult stay with you for the time you are told. It is important to have someone help care for you until you are awake and alert.  Return to your normal activities as told by your health care provider. Ask your health care provider what activities are safe for you.  Take over-the-counter and prescription medicines only as told by your health care provider.  If you smoke, do not smoke without supervision.  Keep all follow-up visits as told by your health care provider. This is important. Contact a health care provider if:  You have nausea or vomiting that does not get better with medicine.  You cannot eat or drink without vomiting.  You have pain that does not get better with medicine.  You are unable to pass urine.  You develop a skin rash.  You have a fever.  You have redness around your IV site that gets worse. Get help right away if:  You have difficulty breathing.  You have chest pain.  You have blood in your urine or stool, or you vomit blood. Summary  After the procedure, it is common to have a sore throat or nausea. It is also common to feel tired.  Have a responsible adult stay with you for the time you are told. It is important to have someone help care for you until you are awake and alert.  When you feel hungry, start by eating small amounts of foods that are soft and easy to digest (bland), such as toast. Gradually return to your regular diet.  Drink enough fluid to keep your urine pale yellow.  Return to your normal activities as told by your health care provider. Ask your health care provider what activities are safe for you. This information is not intended to replace advice given to you by your health care provider. Make sure you discuss any questions you have with your health care provider. Document Revised: 09/01/2020 Document Reviewed:  03/31/2020 Elsevier Patient Education  2021 [employer].

## 2021-02-22 NOTE — Telephone Encounter (Signed)
No pre-certification is required.    CODE DESCRIPTION INQUIRY SUMMARY 18563 Arthrodesis, midtarsal or tarsometatarsa more  Notification/Prior Authorization not required for this service. PLACE OF SERVICE REQUIRED NOT REQUIRED MAY BE REQUIRED ADDITIONAL INFORMATION Acute Hospital     [facility]     Outpatient Facility     Home     Office      CODE DESCRIPTION INQUIRY SUMMARY 14970 Open treatment of tarsometatarsal joint more  Notification/Prior Authorization not required for this service. PLACE OF SERVICE REQUIRED NOT REQUIRED MAY BE REQUIRED ADDITIONAL INFORMATION Acute Hospital     [facility]     Outpatient Facility     Home     Office      Closing encounter.

## 2021-02-23 ENCOUNTER — Other Ambulatory Visit (HOSPITAL_COMMUNITY)
Admission: RE | Admit: 2021-02-23 | Discharge: 2021-02-23 | Disposition: A | Discharge status: Home / Self Care | Payer: Medicare Other | Source: Ambulatory Visit | Attending: Orthopedic Surgery | Admitting: Orthopedic Surgery

## 2021-02-23 ENCOUNTER — Encounter (HOSPITAL_COMMUNITY)
Admission: RE | Admit: 2021-02-23 | Discharge: 2021-02-23 | Disposition: A | Discharge status: Home / Self Care | Payer: Medicare Other | Source: Ambulatory Visit | Attending: Orthopedic Surgery | Admitting: Orthopedic Surgery

## 2021-02-23 ENCOUNTER — Other Ambulatory Visit: Payer: Self-pay

## 2021-02-23 ENCOUNTER — Encounter (HOSPITAL_COMMUNITY): Payer: Self-pay

## 2021-02-23 DIAGNOSIS — Z01812 Encounter for preprocedural laboratory examination: Secondary | ICD-10-CM | POA: Diagnosis present

## 2021-02-23 DIAGNOSIS — Z20822 Contact with and (suspected) exposure to covid-19: Secondary | ICD-10-CM | POA: Diagnosis not present

## 2021-02-23 HISTORY — DX: Carpal tunnel syndrome, unspecified upper limb: G56.00

## 2021-02-23 LAB — BASIC METABOLIC PANEL
Anion gap: 8 (ref 5–15)
BUN: 21 mg/dL (ref 8–23)
CO2: 28 mmol/L (ref 22–32)
Calcium: 9.1 mg/dL (ref 8.9–10.3)
Chloride: 99 mmol/L (ref 98–111)
Creatinine, Ser: 0.93 mg/dL (ref 0.44–1.00)
GFR, Estimated: >60 mL/min (ref >60)
Glucose, Bld: 106 mg/dL — ABNORMAL HIGH (ref 70–99)
Potassium: 3.7 mmol/L (ref 3.5–5.1)
Sodium: 135 mmol/L (ref 135–145)

## 2021-02-23 LAB — CBC
HCT: 46.2 % — ABNORMAL HIGH (ref 36.0–46.0)
Hemoglobin: 14.3 g/dL (ref 12.0–15.0)
MCH: 28.5 pg (ref 26.0–34.0)
MCHC: 31 g/dL (ref 30.0–36.0)
MCV: 92.2 fL (ref 80.0–100.0)
Platelets: 267 10*3/uL (ref 150–400)
RBC: 5.01 MIL/uL (ref 3.87–5.11)
RDW: 14 % (ref 11.5–15.5)
WBC: 6.5 10*3/uL (ref 4.0–10.5)
nRBC: 0 % (ref 0.0–0.2)

## 2021-02-23 LAB — SARS CORONAVIRUS 2 (TAT 6-24 HRS): SARS Coronavirus 2: NEGATIVE

## 2021-02-27 ENCOUNTER — Other Ambulatory Visit: Payer: Self-pay

## 2021-02-27 ENCOUNTER — Ambulatory Visit (HOSPITAL_COMMUNITY): Payer: Medicare Other | Admitting: Anesthesiology

## 2021-02-27 ENCOUNTER — Encounter (HOSPITAL_COMMUNITY): Payer: Self-pay | Admitting: Orthopedic Surgery

## 2021-02-27 ENCOUNTER — Ambulatory Visit (HOSPITAL_COMMUNITY)
Admission: RE | Admit: 2021-02-27 | Discharge: 2021-02-27 | Disposition: A | Discharge status: Home / Self Care | Payer: Medicare Other | Attending: Orthopedic Surgery | Admitting: Orthopedic Surgery

## 2021-02-27 ENCOUNTER — Encounter (HOSPITAL_COMMUNITY)
Admission: RE | Disposition: A | Discharge status: Home / Self Care | Payer: Self-pay | Source: Home / Self Care | Attending: Orthopedic Surgery

## 2021-02-27 ENCOUNTER — Ambulatory Visit (HOSPITAL_COMMUNITY): Payer: Medicare Other

## 2021-02-27 DIAGNOSIS — S92322A Displaced fracture of second metatarsal bone, left foot, initial encounter for closed fracture: Secondary | ICD-10-CM | POA: Insufficient documentation

## 2021-02-27 DIAGNOSIS — Y9389 Activity, other specified: Secondary | ICD-10-CM | POA: Diagnosis not present

## 2021-02-27 DIAGNOSIS — S92345A Nondisplaced fracture of fourth metatarsal bone, left foot, initial encounter for closed fracture: Secondary | ICD-10-CM | POA: Insufficient documentation

## 2021-02-27 DIAGNOSIS — T148XXA Other injury of unspecified body region, initial encounter: Secondary | ICD-10-CM | POA: Diagnosis not present

## 2021-02-27 DIAGNOSIS — W010XXA Fall on same level from slipping, tripping and stumbling without subsequent striking against object, initial encounter: Secondary | ICD-10-CM | POA: Insufficient documentation

## 2021-02-27 DIAGNOSIS — M069 Rheumatoid arthritis, unspecified: Secondary | ICD-10-CM | POA: Insufficient documentation

## 2021-02-27 DIAGNOSIS — Z8249 Family history of ischemic heart disease and other diseases of the circulatory system: Secondary | ICD-10-CM | POA: Insufficient documentation

## 2021-02-27 DIAGNOSIS — Z79899 Other long term (current) drug therapy: Secondary | ICD-10-CM | POA: Diagnosis not present

## 2021-02-27 DIAGNOSIS — S92309A Fracture of unspecified metatarsal bone(s), unspecified foot, initial encounter for closed fracture: Secondary | ICD-10-CM

## 2021-02-27 DIAGNOSIS — Z885 Allergy status to narcotic agent status: Secondary | ICD-10-CM | POA: Insufficient documentation

## 2021-02-27 DIAGNOSIS — Z823 Family history of stroke: Secondary | ICD-10-CM | POA: Insufficient documentation

## 2021-02-27 DIAGNOSIS — S92355A Nondisplaced fracture of fifth metatarsal bone, left foot, initial encounter for closed fracture: Secondary | ICD-10-CM | POA: Diagnosis not present

## 2021-02-27 DIAGNOSIS — S92335A Nondisplaced fracture of third metatarsal bone, left foot, initial encounter for closed fracture: Secondary | ICD-10-CM | POA: Diagnosis not present

## 2021-02-27 DIAGNOSIS — Z888 Allergy status to other drugs, medicaments and biological substances status: Secondary | ICD-10-CM | POA: Diagnosis not present

## 2021-02-27 DIAGNOSIS — S92302A Fracture of unspecified metatarsal bone(s), left foot, initial encounter for closed fracture: Secondary | ICD-10-CM

## 2021-02-27 DIAGNOSIS — S92312A Displaced fracture of first metatarsal bone, left foot, initial encounter for closed fracture: Secondary | ICD-10-CM | POA: Diagnosis present

## 2021-02-27 DIAGNOSIS — Z8261 Family history of arthritis: Secondary | ICD-10-CM | POA: Insufficient documentation

## 2021-02-27 HISTORY — PX: ORIF TOE FRACTURE: SHX5032

## 2021-02-27 SURGERY — OPEN REDUCTION INTERNAL FIXATION (ORIF) METATARSAL (TOE) FRACTURE
Anesthesia: General | Site: Toe | Laterality: Left

## 2021-02-27 MED ORDER — LIDOCAINE HCL (PF) 1 % IJ SOLN
INTRAMUSCULAR | Status: DC | PRN
  Administered 2021-02-27 (×2): 2 mL

## 2021-02-27 MED ORDER — BUPIVACAINE HCL (PF) 0.5 % IJ SOLN
INTRAMUSCULAR | Status: AC
  Filled 2021-02-27: qty 30

## 2021-02-27 MED ORDER — BUPIVACAINE HCL (PF) 0.5 % IJ SOLN
INTRAMUSCULAR | Status: DC | PRN
  Administered 2021-02-27: 14 mL via PERINEURAL

## 2021-02-27 MED ORDER — LACTATED RINGERS IV SOLN: INTRAVENOUS | Status: DC

## 2021-02-27 MED ORDER — ONDANSETRON HCL 4 MG PO TABS: 4.0000 mg | ORAL_TABLET | Freq: Once | ORAL | Status: DC

## 2021-02-27 MED ORDER — ONDANSETRON HCL 4 MG/2ML IJ SOLN
INTRAMUSCULAR | Status: DC | PRN
  Administered 2021-02-27: 4 mg via INTRAVENOUS

## 2021-02-27 MED ORDER — OXYCODONE HCL 5 MG PO TABS: 5.0000 mg | ORAL_TABLET | Freq: Four times a day (QID) | ORAL | 0 refills | Status: AC | PRN

## 2021-02-27 MED ORDER — CELECOXIB 100 MG PO CAPS: 100.0000 mg | ORAL_CAPSULE | Freq: Every day | ORAL | 0 refills | Status: AC

## 2021-02-27 MED ORDER — LIDOCAINE HCL (PF) 1 % IJ SOLN
INTRAMUSCULAR | Status: AC
  Filled 2021-02-27: qty 30

## 2021-02-27 MED ORDER — MIDAZOLAM HCL 2 MG/2ML IJ SOLN
2.0000 mg | Freq: Once | INTRAMUSCULAR | Status: AC
  Administered 2021-02-27: 1 mg via INTRAVENOUS

## 2021-02-27 MED ORDER — PHENYLEPHRINE HCL (PRESSORS) 10 MG/ML IV SOLN
INTRAVENOUS | Status: AC
  Filled 2021-02-27: qty 1

## 2021-02-27 MED ORDER — DEXAMETHASONE SODIUM PHOSPHATE 4 MG/ML IJ SOLN
INTRAMUSCULAR | Status: AC
  Filled 2021-02-27: qty 2

## 2021-02-27 MED ORDER — FENTANYL CITRATE (PF) 100 MCG/2ML IJ SOLN: 25.0000 ug | INTRAMUSCULAR | Status: DC | PRN

## 2021-02-27 MED ORDER — PHENYLEPHRINE HCL (PRESSORS) 10 MG/ML IV SOLN
INTRAVENOUS | Status: DC | PRN
  Administered 2021-02-27 (×3): 80 ug via INTRAVENOUS
  Administered 2021-02-27: 40 ug via INTRAVENOUS
  Administered 2021-02-27: 80 ug via INTRAVENOUS
  Administered 2021-02-27: 40 ug via INTRAVENOUS
  Administered 2021-02-27 (×2): 80 ug via INTRAVENOUS

## 2021-02-27 MED ORDER — BUPIVACAINE-EPINEPHRINE (PF) 0.25% -1:200000 IJ SOLN
INTRAMUSCULAR | Status: DC | PRN
  Administered 2021-02-27 (×2): 15 mL via PERINEURAL

## 2021-02-27 MED ORDER — BUPIVACAINE-EPINEPHRINE (PF) 0.25% -1:200000 IJ SOLN
INTRAMUSCULAR | Status: AC
  Filled 2021-02-27: qty 30

## 2021-02-27 MED ORDER — ONDANSETRON 4 MG PO TBDP
4.0000 mg | ORAL_TABLET | Freq: Once | ORAL | Status: AC
  Administered 2021-02-27: 4 mg via ORAL

## 2021-02-27 MED ORDER — ACETAMINOPHEN 500 MG PO TABS: 1000.0000 mg | ORAL_TABLET | Freq: Three times a day (TID) | ORAL | 0 refills | Status: AC

## 2021-02-27 MED ORDER — DEXAMETHASONE SODIUM PHOSPHATE 4 MG/ML IJ SOLN: INTRAMUSCULAR | Status: DC | PRN

## 2021-02-27 MED ORDER — CEFAZOLIN SODIUM-DEXTROSE 2-4 GM/100ML-% IV SOLN
2.0000 g | INTRAVENOUS | Status: AC
  Administered 2021-02-27: 2 g via INTRAVENOUS

## 2021-02-27 MED ORDER — DEXAMETHASONE SODIUM PHOSPHATE 10 MG/ML IJ SOLN
INTRAMUSCULAR | Status: DC | PRN
  Administered 2021-02-27: 5 mg via INTRAVENOUS

## 2021-02-27 MED ORDER — ONDANSETRON HCL 4 MG/2ML IJ SOLN
INTRAMUSCULAR | Status: AC
  Filled 2021-02-27: qty 2

## 2021-02-27 MED ORDER — ASPIRIN EC 81 MG PO TBEC: 81.0000 mg | DELAYED_RELEASE_TABLET | Freq: Two times a day (BID) | ORAL | 0 refills | Status: AC

## 2021-02-27 MED ORDER — ONDANSETRON HCL 4 MG PO TABS: 4.0000 mg | ORAL_TABLET | Freq: Three times a day (TID) | ORAL | 0 refills | Status: AC | PRN

## 2021-02-27 MED ORDER — DEXAMETHASONE SODIUM PHOSPHATE 4 MG/ML IJ SOLN
INTRAMUSCULAR | Status: DC | PRN
  Administered 2021-02-27 (×2): 4 mg via PERINEURAL

## 2021-02-27 MED ORDER — ONDANSETRON HCL 4 MG/2ML IJ SOLN: 4.0000 mg | Freq: Once | INTRAMUSCULAR | Status: DC | PRN

## 2021-02-27 MED ORDER — PROPOFOL 10 MG/ML IV BOLUS
INTRAVENOUS | Status: AC
  Filled 2021-02-27: qty 20

## 2021-02-27 MED ORDER — PROPOFOL 10 MG/ML IV BOLUS
INTRAVENOUS | Status: DC | PRN
  Administered 2021-02-27: 120 mg via INTRAVENOUS

## 2021-02-27 MED ORDER — SODIUM CHLORIDE 0.9 % IR SOLN
Status: DC | PRN
  Administered 2021-02-27: 1000 mL

## 2021-02-27 MED ORDER — ORAL CARE MOUTH RINSE: 15.0000 mL | Freq: Once | OROMUCOSAL | Status: AC

## 2021-02-27 MED ORDER — CEFAZOLIN SODIUM-DEXTROSE 2-4 GM/100ML-% IV SOLN
INTRAVENOUS | Status: AC
  Filled 2021-02-27: qty 100

## 2021-02-27 MED ORDER — CHLORHEXIDINE GLUCONATE 0.12 % MT SOLN
15.0000 mL | Freq: Once | OROMUCOSAL | Status: AC
  Administered 2021-02-27: 15 mL via OROMUCOSAL

## 2021-02-27 MED ORDER — FENTANYL CITRATE (PF) 250 MCG/5ML IJ SOLN
INTRAMUSCULAR | Status: DC | PRN
  Administered 2021-02-27 (×4): 25 ug via INTRAVENOUS

## 2021-02-27 MED ORDER — PHENYLEPHRINE 40 MCG/ML (10ML) SYRINGE FOR IV PUSH (FOR BLOOD PRESSURE SUPPORT)
PREFILLED_SYRINGE | INTRAVENOUS | Status: AC
  Filled 2021-02-27: qty 30

## 2021-02-27 MED ORDER — FENTANYL CITRATE (PF) 100 MCG/2ML IJ SOLN
INTRAMUSCULAR | Status: AC
  Filled 2021-02-27: qty 2

## 2021-02-27 MED ORDER — MIDAZOLAM HCL 2 MG/2ML IJ SOLN
INTRAMUSCULAR | Status: AC
  Filled 2021-02-27: qty 2

## 2021-02-27 MED ORDER — LIDOCAINE HCL (CARDIAC) PF 100 MG/5ML IV SOSY
PREFILLED_SYRINGE | INTRAVENOUS | Status: DC | PRN
  Administered 2021-02-27: 60 mg via INTRAVENOUS

## 2021-02-27 MED ORDER — ONDANSETRON 4 MG PO TBDP
ORAL_TABLET | ORAL | Status: AC
  Filled 2021-02-27: qty 1

## 2021-02-27 MED ORDER — LIDOCAINE HCL (PF) 2 % IJ SOLN
INTRAMUSCULAR | Status: AC
  Filled 2021-02-27: qty 5

## 2021-02-27 MED ORDER — DEXAMETHASONE SODIUM PHOSPHATE 10 MG/ML IJ SOLN
INTRAMUSCULAR | Status: AC
  Filled 2021-02-27: qty 1

## 2021-02-27 SURGICAL SUPPLY — 68 items
APL PRP STRL LF DISP 70% ISPRP (MISCELLANEOUS) ×1
BANDAGE ELASTIC 4 VELCRO ST LF (GAUZE/BANDAGES/DRESSINGS) ×6 IMPLANT
BANDAGE ESMARK 4X12 BL STRL LF (DISPOSABLE) ×1 IMPLANT
BB-TAK SMALL (MISCELLANEOUS) ×4
BIT DRILL 1.7 (BIT) ×2
BIT DRILL 1.7X (BIT) ×1 IMPLANT
BIT DRILL CALIBR MINI 1.5 (BIT) ×2 IMPLANT
BNDG CMPR 12X4 ELC STRL LF (DISPOSABLE) ×1
BNDG CMPR STD VLCR NS LF 5.8X3 (GAUZE/BANDAGES/DRESSINGS)
BNDG COHESIVE 4X5 TAN STRL (GAUZE/BANDAGES/DRESSINGS) ×2 IMPLANT
BNDG ELASTIC 3X5.8 VLCR NS LF (GAUZE/BANDAGES/DRESSINGS) IMPLANT
BNDG ESMARK 4X12 BLUE STRL LF (DISPOSABLE) ×2
CAP PIN PROTECTOR ORTHO WHT (CAP) IMPLANT
CHLORAPREP W/TINT 26 (MISCELLANEOUS) ×2 IMPLANT
CLOTH BEACON ORANGE TIMEOUT ST (SAFETY) ×2 IMPLANT
COVER LIGHT HANDLE STERIS (MISCELLANEOUS) ×4 IMPLANT
COVER WAND RF STERILE (DRAPES) ×2 IMPLANT
CUFF TOURN SGL QUICK 34 (TOURNIQUET CUFF) ×2
CUFF TRNQT CYL 34X4.125X (TOURNIQUET CUFF) ×1 IMPLANT
DRESSING XEROFORM 5X9 (GAUZE/BANDAGES/DRESSINGS) ×2 IMPLANT
ELECT REM PT RETURN 9FT ADLT (ELECTROSURGICAL) ×2
ELECTRODE REM PT RTRN 9FT ADLT (ELECTROSURGICAL) ×1 IMPLANT
FIXATION BB TAK SMALL (MISCELLANEOUS) ×2 IMPLANT
GAUZE SPONGE 4X4 12PLY STRL (GAUZE/BANDAGES/DRESSINGS) ×2 IMPLANT
GAUZE XEROFORM 5X9 LF (GAUZE/BANDAGES/DRESSINGS) ×2 IMPLANT
GLOVE SKINSENSE NS SZ8.0 LF (GLOVE) ×1
GLOVE SKINSENSE STRL SZ8.0 LF (GLOVE) ×1 IMPLANT
GLOVE SURG UNDER LTX SZ8 (GLOVE) ×2 IMPLANT
GLOVE SURG UNDER POLY LF SZ7 (GLOVE) ×4 IMPLANT
GOWN STRL REUS W/ TWL XL LVL3 (GOWN DISPOSABLE) ×1 IMPLANT
GOWN STRL REUS W/TWL LRG LVL3 (GOWN DISPOSABLE) ×8 IMPLANT
GOWN STRL REUS W/TWL XL LVL3 (GOWN DISPOSABLE) ×2
GUIDEWIRE TROCAR 1.1 (ORTHOPEDIC DISPOSABLE SUPPLIES) ×2 IMPLANT
KIT TURNOVER KIT A (KITS) ×2 IMPLANT
MANIFOLD NEPTUNE II (INSTRUMENTS) ×2 IMPLANT
NEEDLE HYPO 21X1.5 SAFETY (NEEDLE) IMPLANT
NS IRRIG 1000ML POUR BTL (IV SOLUTION) ×2 IMPLANT
PACK BASIC LIMB (CUSTOM PROCEDURE TRAY) ×2 IMPLANT
PAD ABD 5X9 TENDERSORB (GAUZE/BANDAGES/DRESSINGS) ×4 IMPLANT
PAD CAST 4YDX4 CTTN HI CHSV (CAST SUPPLIES) IMPLANT
PADDING CAST COTTON 4X4 STRL (CAST SUPPLIES)
PADDING WEBRIL 4 STERILE (GAUZE/BANDAGES/DRESSINGS) ×8 IMPLANT
PIN CAPS ORTHO GREEN .062 (PIN) IMPLANT
PLATE 8 HOLE STR. REINFORCED (Plate) ×2 IMPLANT
PLATE Y 2.4 6H (Plate) ×2 IMPLANT
SCREW 2.0 LOCKING VAL 10 (Screw) ×4 IMPLANT
SCREW 2.0 LOCKING VAL 11 (Screw) ×4 IMPLANT
SCREW L/P 2.0X12 CORTICAL (Screw) ×2 IMPLANT
SCREW L/P 2X11 CORTICAL (Screw) ×2 IMPLANT
SCREW LOCK 14X2.4XVA NS LF (Screw) ×1 IMPLANT
SCREW LOCK 16X2.4XVA NS LF (Screw) ×1 IMPLANT
SCREW LOCK 2.4X12 (Screw) ×2 IMPLANT
SCREW LOCK 2.4X14 (Screw) ×2 IMPLANT
SCREW LOCK 2.4X16 (Screw) ×2 IMPLANT
SCREW LOCK 2.4X20 (Screw) ×4 IMPLANT
SCREW LOCK 20X2.4XVA NS LF (Screw) ×2 IMPLANT
SCREW LOCK VA 2.4X24 (Screw) ×2 IMPLANT
SET BASIN LINEN APH (SET/KITS/TRAYS/PACK) ×2 IMPLANT
SPLINT IMMOBILIZER J 3INX20FT (CAST SUPPLIES)
SPLINT J IMMOBILIZER 3X20FT (CAST SUPPLIES) IMPLANT
SPLINT PLASTER CAST XFAST 5X30 (CAST SUPPLIES) ×5 IMPLANT
SPLINT PLASTER XFAST SET 5X30 (CAST SUPPLIES) ×5
SUT ETHILON 3 0 FSL (SUTURE) ×2 IMPLANT
SUT MON AB 0 CT1 (SUTURE) IMPLANT
SUT MON AB 2-0 SH 27 (SUTURE)
SUT MON AB 2-0 SH27 (SUTURE) IMPLANT
SYR 10ML LL (SYRINGE) IMPLANT
SYR BULB IRRIG 60ML STRL (SYRINGE) ×2 IMPLANT

## 2021-02-27 NOTE — Anesthesia Postprocedure Evaluation (Signed)
Anesthesia Post Note  Patient: Kim [name]  Procedure(s) Performed: OPEN REDUCTION INTERNAL FIXATION (ORIF) METATARSAL (TOE) FRACTURE (Left Toe)  Patient location during evaluation: PACU Anesthesia Type: General Level of consciousness: awake and alert, oriented and patient cooperative Pain management: pain level controlled Vital Signs Assessment: post-procedure vital signs reviewed and stable Respiratory status: spontaneous breathing and respiratory function stable Cardiovascular status: blood pressure returned to baseline Postop Assessment: no apparent nausea or vomiting Anesthetic complications: no   No complications documented.   Last Vitals:  Vitals:   02/27/21 1200 02/27/21 1236  BP: 128/73 128/73  Pulse: 83 86  Resp: 13 16  Temp:  36.4 C  SpO2: 98% 98%    Last Pain:  Vitals:   02/27/21 1236  TempSrc: Oral  PainSc: 0-No pain                 Lilith Solana C [address] Eland

## 2021-02-27 NOTE — Transfer of Care (Signed)
Immediate Anesthesia Transfer of Care Note  Patient: Yasmine Kilbourne  Procedure(s) Performed: OPEN REDUCTION INTERNAL FIXATION (ORIF) METATARSAL (TOE) FRACTURE (Left Toe)  Patient Location: PACU  Anesthesia Type:General  Level of Consciousness: drowsy  Airway & Oxygen Therapy: Patient Spontanous Breathing and Patient connected to nasal cannula oxygen  Post-op Assessment: Report given to RN and Post -op Vital signs reviewed and stable  Post vital signs: Reviewed and stable  Last Vitals:  Vitals Value Taken Time  BP    Temp    Pulse 77 02/27/21 1113  Resp 13 02/27/21 1113  SpO2 100 % 02/27/21 1113  Vitals shown include unvalidated device data.  Last Pain:  Vitals:   02/27/21 0704  TempSrc: Oral  PainSc: 4       Patients Stated Pain Goal: 5 ([id number])  Complications: No complications documented.

## 2021-02-27 NOTE — Anesthesia Procedure Notes (Signed)
Anesthesia Regional Block: Popliteal block   Pre-Anesthetic Checklist: ,, timeout performed, Correct Patient, Correct Site, Correct Laterality, Correct Procedure, Correct Position, site marked, Risks and benefits discussed,  Surgical consent,  Pre-op evaluation,  At surgeon's request and post-op pain management  Laterality: Left  Prep: chloraprep       Needles:  Injection technique: Single-shot  Needle Type: Echogenic Stimulator Needle     Needle Length: 10cm  Needle Gauge: 20   Needle insertion depth: 6 cm   Additional Needles:   Procedures:,,,, ultrasound used (permanent image in chart),,,,  Narrative:  Start time: 02/27/2021 7:34 AM End time: 02/27/2021 7:44 AM Injection made incrementally with aspirations every 5 mL.  Performed by: Personally  Anesthesiologist: Denese Killings, MD  Additional Notes: BP cuff, EKG monitors applied. Sedation begun.  After nerve location anesthetic injected incrementally, slowly , and after neg aspirations. Tolerated well.  bupivacaine 0.25% 15 ml plus bupivacaine 0.5% 14 ml plus dexamethasone 4 mg - total of 30 ml was injected at left popliteal blaock

## 2021-02-27 NOTE — OR Nursing (Addendum)
Time out performed at 0733. for block to left ankle. See time out to chl documentation. Left side block complete at 0745.  Second block to inner left knee completed at [place] Procedure complete [phone]

## 2021-02-27 NOTE — Op Note (Signed)
Orthopaedic Surgery Operative Note (CSN: [id number])  Kim [name]  03/14/1941 Date of Surgery: 02/27/2021   Diagnoses:  Left foot, 1st and 2nd metatarsal shaft fractures  Procedure: ORIF of left 1st and 2nd metatarsal fractures   Operative Finding Successful completion of the planned procedure.  Operative fixation of the 1st metatarsal fracture to maintain stability of the foot, even though the 1st metatarsal fracture was minimally displaced.  The fracture to the 2nd metatarsal was significantly displaced with interposed soft tissue.  Improved alignment of both the first and second metatarsals.  Evaluation of the first TMT joint demonstrated that this joint was stable, and there is no arthritic changes.  As result, I elected not to proceed with fusion of the first MP joint.   Post-Op Diagnosis: Same Surgeons:Primary: Mordecai Rasmussen, MD Assistants: Marquita Palms Location: [facility] OR ROOM 4 Anesthesia: General with regional anesthesia Antibiotics: Ancef 2 g Tourniquet time:  Total Tourniquet Time Documented: Thigh (Left) - 108 minutes Total: Thigh (Left) - 108 minutes  Estimated Blood Loss: 25 cc Complications: None Specimens: None Implants: Implant Name Type Inv. Item Serial No. Manufacturer Lot No. LRB No. Used Action  Arthrex     [id number] Left 1 Implanted  Arthrex 2.0      [id number] Left 1 Implanted  arthrex     [id number] Left 1 Implanted  Arthrex     [id number] Left 2 Implanted  Arthrex     [id number] Left 1 Implanted  Arthrex     [id number] Left 2 Implanted  Arthrex     [id number] Left 1 Implanted  Arthrex     [id number] Left 2 Implanted  ARTHREX     [id number] Left 1 Implanted  Artghrex     [id number] Left 1 Implanted  Arthrex     [id number] Left 1 Implanted  Arthrex     [id number] Left 1 Implanted    Indications for Surgery:   Kim [name] is a 80 y.o. female who sustained fractures to all 5 metatarsals in her left foot.  The first metatarsal was  minimally displaced, although the second metatarsal was significantly displaced.  In addition, the third and fourth metatarsals were segmental, but in relatively normal alignment.  Due to the displaced nature of the second metatarsal, and the involvement of multiple metatarsals, we plan to proceed with operative fixation.  Benefits and risks of operative and nonoperative management were discussed prior to surgery with patient/guardian(s) and informed consent form was completed.  Specific risks including infection, need for additional surgery, bleeding, nonunion, malunion, persistent pain, need for further surgery and more severe complications associated with anesthesia.  Patient elected proceed.   Procedure:   The patient was identified properly. Informed consent was obtained and the surgical site was marked. The patient was taken up to suite where general anesthesia was induced.  The patient was positioned supine.  The left leg was prepped and draped in the usual sterile fashion.  Timeout was performed before the beginning of the case.  Tourniquet was used for the above duration.  Ancef was dosed prior to incision.  Using fluoroscopy, a longitudinal incision was planned in the first webspace spanning the first and second metatarsals.  We incised sharply through the skin.  We then used blunt dissection.  There were a couple of crossing veins which were ligated.  The EHL and EHB tendons were identified and protected.  In addition, the deep peroneal nerve was identified and retracted.  We carried blunt dissection down to the medial aspect of  the second metatarsal shaft.  The proximal fragment was easily identified.  However, due to the displaced nature of the distal fragment, we had some difficulty identifying this fragment.  With the guidance of fluoroscopy, the distal fragment was identified.  There is significant soft tissue interposed within the fracture fragments.  This was dissected bluntly.  With a  combination of traction and the use of a lobster claw, the fracture was reduced.  The reduction was confirmed under fluoroscopy.  An appropriately sized plate was then selected on the back table and provisionally secured to the dorsal aspect of the second metatarsal using K wires.  Once again, the placement of the plate was confirmed under fluoroscopy.  We then proceeded to place multiple screws both proximal and distal to the fracture.  Need for screw that was placed was a nonlocking screw.  The remainder of the screws were locking screws.  Final reduction and placement of the plate and screws were confirmed with orthogonal views under fluoroscopy.  Once were satisfied with the reduction and placement of screws, we turned our attention the first metatarsal.  The first metatarsal shaft was easily identified.  We carried our dissection bluntly distally over the metatarsal shaft.  Under fluoroscopy, a needle was placed within the first TMT joint.  Once again this remained stable.  We then dissected some of the soft tissue and overlying capsule to the first TMT joint at the proximal extent of the first metatarsal.  We then selected a T-shaped plate from the back table, and remove the 2 most distal screw holes to ensure proper fit.  This was provisionally secured with K wires.  Orthogonal views of fluoroscopy confirmed the placement of the plate, which did not breach the first TMT joint.  3 locking screws were placed in the proximal fragment.  Orthogonal views on x-ray confirmed that none of the screws had entered the first TMT joint.  Once were satisfied with the position of the plate at the proximal aspect of the first metatarsal, we proceeded to place 3 distal locking screws.  Orthogonal views on fluoroscopy confirmed we had maintained the reduction, and then all screws were appropriate length and not within the joint.  We evaluated the soft tissues.  We identified the deep peroneal nerve which remained intact.   We then let down the tourniquet and minimal bleeding was encountered.  There was no pulsatile bleeding.  We irrigated the wound copiously.  We closed the incision with 3-0 nylon.  A sterile dressing was placed, followed by a well-padded short leg splint.  The patient was awoken taken to PACU in stable condition.   Post-operative plan:  The patient will be discharged from the PACU. She will remain nonweightbearing on the left lower extremity. The splint is to remain clean, dry and intact DVT prophylaxis Aspirin 81 mg twice daily for 6 weeks.    Pain control with PRN pain medication preferring oral medicines.   Follow up plan will be scheduled in approximately 10-14 days for incision check and XR.

## 2021-02-27 NOTE — Anesthesia Procedure Notes (Signed)
Date/Time: 02/27/2021 8:42 AM Performed by: Karna Dupes, CRNA Pre-anesthesia Checklist: Patient identified, Emergency Drugs available, Suction available and Patient being monitored Patient Re-evaluated:Patient Re-evaluated prior to induction Oxygen Delivery Method: Circle system utilized Preoxygenation: Pre-oxygenation with 100% oxygen Induction Type: IV induction LMA: LMA inserted LMA Size: 4.0 Number of attempts: 1 Tube secured with: Tape Dental Injury: Teeth and Oropharynx as per pre-operative assessment

## 2021-02-27 NOTE — Addendum Note (Signed)
Addendum  created 02/27/21 1502 by Denese Killings, MD   Child order released for a procedure order, Clinical Note Signed, Intraprocedure Blocks edited, Intraprocedure Meds edited

## 2021-02-27 NOTE — Anesthesia Preprocedure Evaluation (Signed)
Anesthesia Evaluation  Patient identified by MRN, date of birth, ID band Patient awake    Reviewed: Allergy & Precautions, NPO status , Patient's Chart, lab work & pertinent test results  History of Anesthesia Complications Negative for: history of anesthetic complications  Airway Mallampati: II  TM Distance: >3 FB Neck ROM: Full    Dental  (+) Edentulous Upper, Edentulous Lower   Pulmonary shortness of breath and with exertion, neg COPD,  COPD inhaler,    Pulmonary exam normal breath sounds clear to auscultation       Cardiovascular Exercise Tolerance: Poor hypertension, Pt. on medications Normal cardiovascular exam Rhythm:Regular Rate:Normal  03-Jun-2020 14:49:02 [facility] ROUTINE RECORD Sinus arrhythmia Incomplete left bundle branch block Probable left ventricular hypertrophy Anterior Q waves, possibly due to LVH   Neuro/Psych  Neuromuscular disease negative psych ROS   GI/Hepatic Neg liver ROS, GERD  Medicated,  Endo/Other  negative endocrine ROS  Renal/GU negative Renal ROS     Musculoskeletal  (+) Arthritis , Rheumatoid disorders,    Abdominal   Peds  Hematology negative hematology ROS (+)   Anesthesia Other Findings   Reproductive/Obstetrics negative OB ROS                            Anesthesia Physical Anesthesia Plan  ASA: III  Anesthesia Plan: General   Post-op Pain Management:  Regional for Post-op pain   Induction: Intravenous  PONV Risk Score and Plan: Ondansetron and Dexamethasone  Airway Management Planned: LMA  Additional Equipment:   Intra-op Plan:   Post-operative Plan: Extubation in OR  Informed Consent: I have reviewed the patients History and Physical, chart, labs and discussed the procedure including the risks, benefits and alternatives for the proposed anesthesia with the patient or authorized representative who has indicated  his/her understanding and acceptance.       Plan Discussed with: CRNA and Surgeon  Anesthesia Plan Comments:        Anesthesia Quick Evaluation

## 2021-02-27 NOTE — Discharge Instructions (Signed)
Mark A. Amedeo Kinsman, MD [facility] [address] [place],  [place]  [address] Phone: [phone] Fax: [phone]   [place] ? Please keep splint clean dry and intact until followup.  ? You may shower on Post-Op Day #2.  ? You must keep splint dry during this process and may find that a plastic bag taped around the leg or alternatively a towel based bath may be a better option.   ? If you get your splint wet or if it is damaged please contact our clinic. ? ELEVATE your foot as much as possible  EXERCISES ? Due to your splint being in place you will not be able to bear weight through your extremity.   ? DO NOT PUT ANY WEIGHT ON YOUR OPERATIVE LEG ? Please use crutches or a walker to avoid weight bearing.   REGIONAL ANESTHESIA (NERVE BLOCKS)  The anesthesia team may have performed a nerve block for you if safe in the setting of your care.  This is a great tool used to minimize pain.  Typically the block may start wearing off overnight but the long acting medicine may last for 3-4 days.  The nerve block wearing off can be a challenging period but please utilize your as needed pain medications to try and manage this period.    POST-OP MEDICATIONS- Multimodal approach to pain control  In general your pain will be controlled with a combination of substances.  Prescriptions unless otherwise discussed are electronically sent to your pharmacy.  This is a carefully made plan we use to minimize narcotic use.     - Meloxicam OR Celebrex - Anti-inflammatory medication taken on a scheduled basis  - Acetaminophen - Non-narcotic pain medicine taken on a scheduled basis   - Oxycodone - This is a strong narcotic, to be used only on an "as needed" basis for pain.  -  Aspirin 81mg  - This medicine is used to minimize the risk of blood clots after surgery.             -          Zofran - take as needed for nausea    FOLLOW-UP ? If you develop a Fever (>101.5), Redness or Drainage from the surgical incision site, please call our office to arrange for an evaluation. ? Please call the office to schedule a follow-up appointment for your incision check if you do not already have one, 10-14 days post-operatively.  IF YOU HAVE ANY QUESTIONS, PLEASE FEEL FREE TO CALL OUR OFFICE.  HELPFUL INFORMATION  ? If you had a block, it will wear off between 8-24 hrs postop typically.  This is period when your pain may go from nearly zero to the pain you would have had postop without the block.  This is an abrupt transition but nothing dangerous is happening.  You may take an extra dose of narcotic when this happens.  ? You should wean off your narcotic medicines as soon as you are able.  Most patients will be off or using minimal narcotics before their first postop appointment.   ? Elevating your leg will help with swelling and pain control.  You are encouraged to elevate your leg as much as possible in the first couple of weeks following surgery.  Imagine a drop of water on your toe, and your goal is to get that water back to your heart.  ? We suggest you use  the pain medication the first night prior to going to bed, in order to ease any pain when the anesthesia wears off. You should avoid taking pain medications on an empty stomach as it will make you nauseous.  ? Do not drink alcoholic beverages or take illicit drugs when taking pain medications.  ? In most states it is against the law to drive while you are in a splint or sling.  And certainly against the law to drive while taking narcotics.  ? You may return to work/school in the next couple of days when you feel up to it.   ? Pain medication may make you constipated.  Below are a few solutions to try in this order: - Decrease the amount of pain medication if you arent having pain. - Drink lots of decaffeinated fluids. - Drink prune juice and/or each dried  prunes  o If the first 3 dont work start with additional solutions - Take Colace - an over-the-counter stool softener - Take Senokot - an over-the-counter laxative - Take Miralax - a stronger over-the-counter laxative   PATIENT INSTRUCTIONS POST-ANESTHESIA  IMMEDIATELY FOLLOWING SURGERY:  Do not drive or operate machinery for the first twenty four hours after surgery.  Do not make any important decisions for twenty four hours after surgery or while taking narcotic pain medications or sedatives.  If you develop intractable nausea and vomiting or a severe headache please notify your doctor immediately.  FOLLOW-UP:  Please make an appointment with your surgeon as instructed. You do not need to follow up with anesthesia unless specifically instructed to do so.  WOUND CARE INSTRUCTIONS (if applicable):  Keep a dry clean dressing on the anesthesia/puncture wound site if there is drainage.  Once the wound has quit draining you may leave it open to air.  Generally you should leave the bandage intact for twenty four hours unless there is drainage.  If the epidural site drains for more than 36-48 hours please call the anesthesia department.  QUESTIONS?:  Please feel free to call your physician or the hospital operator if you have any questions, and they will be happy to assist you.

## 2021-02-27 NOTE — Interval H&P Note (Signed)
History and Physical Interval Note:  02/27/2021 7:19 AM  Kim [name]  has presented today for surgery, with the diagnosis of 1st and 2nd metatarsal shaft fractures.  The various methods of treatment have been discussed with the patient and family. After consideration of risks, benefits and other options for treatment, the patient has consented to  Procedure(s) with comments: OPEN REDUCTION INTERNAL FIXATION (ORIF) METATARSAL (TOE) FRACTURE (Left) - ORIF of 1st and 2nd metatarsal fractures TARSAL METATARSAL FUSION (Left) - Fusion of 1st metatarsal to medial cuneiform as a surgical intervention.  The patient's history has been reviewed, patient examined, no change in status, stable for surgery.  I have reviewed the patient's chart and labs.  Questions were answered to the patient's satisfaction.    Kim [name] continues to have pain in her left foot.  Will evaluate the fractures during surgery, and decide intraop whether the 1st TMT fusion is necessary.  On radiographs, the joint is not dislocated, and minimal evidence of degenerative changes.  Regardless, will plan to fix the 1st and 2nd MT fractures to provide stability for the rest of the foot.  She will be placed in a splint postop, and remain NWB.    Kim [name]

## 2021-02-27 NOTE — Anesthesia Procedure Notes (Signed)
Anesthesia Regional Block: Adductor canal block   Pre-Anesthetic Checklist: ,, timeout performed, Correct Patient, Correct Site, Correct Laterality, Correct Procedure, Correct Position, site marked, Risks and benefits discussed,  Surgical consent,  Pre-op evaluation,  At surgeon's request and post-op pain management  Laterality: Left  Prep: chloraprep       Needles:  Injection technique: Single-shot  Needle Type: Echogenic Stimulator Needle     Needle Length: 10cm  Needle Gauge: 20   Needle insertion depth: 7 cm   Additional Needles:   Procedures:,,,, ultrasound used (permanent image in chart),,,,  Narrative:  Start time: 02/27/2021 7:45 AM End time: 02/27/2021 7:49 AM Injection made incrementally with aspirations every 5 mL.  Performed by: Personally  Anesthesiologist: Denese Killings, MD  Additional Notes: BP cuff, EKG monitors applied. Sedation begun. After nerve location anesthetic injected incrementally, slowly , and after neg aspirations. Tolerated well.  Bupivacaine 0.25% 15 ml plus dexamethasone 4 mg was injected at left adductor canal site.

## 2021-03-01 ENCOUNTER — Encounter (HOSPITAL_COMMUNITY): Payer: Self-pay | Admitting: Orthopedic Surgery

## 2021-03-10 ENCOUNTER — Ambulatory Visit (INDEPENDENT_AMBULATORY_CARE_PROVIDER_SITE_OTHER): Payer: 59 | Admitting: Orthopedic Surgery

## 2021-03-10 ENCOUNTER — Encounter: Payer: Self-pay | Admitting: Orthopedic Surgery

## 2021-03-10 ENCOUNTER — Ambulatory Visit: Payer: 59

## 2021-03-10 ENCOUNTER — Other Ambulatory Visit: Payer: Self-pay

## 2021-03-10 DIAGNOSIS — S92322D Displaced fracture of second metatarsal bone, left foot, subsequent encounter for fracture with routine healing: Secondary | ICD-10-CM

## 2021-03-10 DIAGNOSIS — S92335A Nondisplaced fracture of third metatarsal bone, left foot, initial encounter for closed fracture: Secondary | ICD-10-CM

## 2021-03-10 DIAGNOSIS — S92355A Nondisplaced fracture of fifth metatarsal bone, left foot, initial encounter for closed fracture: Secondary | ICD-10-CM

## 2021-03-10 DIAGNOSIS — S92345D Nondisplaced fracture of fourth metatarsal bone, left foot, subsequent encounter for fracture with routine healing: Secondary | ICD-10-CM

## 2021-03-10 DIAGNOSIS — S92315D Nondisplaced fracture of first metatarsal bone, left foot, subsequent encounter for fracture with routine healing: Secondary | ICD-10-CM

## 2021-03-10 DIAGNOSIS — S92355D Nondisplaced fracture of fifth metatarsal bone, left foot, subsequent encounter for fracture with routine healing: Secondary | ICD-10-CM

## 2021-03-10 DIAGNOSIS — S92335D Nondisplaced fracture of third metatarsal bone, left foot, subsequent encounter for fracture with routine healing: Secondary | ICD-10-CM

## 2021-03-10 DIAGNOSIS — S92345A Nondisplaced fracture of fourth metatarsal bone, left foot, initial encounter for closed fracture: Secondary | ICD-10-CM

## 2021-03-10 DIAGNOSIS — S92322A Displaced fracture of second metatarsal bone, left foot, initial encounter for closed fracture: Secondary | ICD-10-CM

## 2021-03-10 MED ORDER — OXYCODONE HCL 5 MG PO TABS: 5.0000 mg | ORAL_TABLET | Freq: Four times a day (QID) | ORAL | 0 refills | Status: DC | PRN

## 2021-03-10 NOTE — Progress Notes (Addendum)
Orthopaedic Postop Note  Assessment: Kim [name] is a 80 y.o. female s/p ORIF of L 1st and 2nd metatarsal fracture; nonoperative management of L 3rd, 4th and 5th metatarsal shaft fractures  DOS: 02/27/2021  Plan: Stitches removed, steri strips placed XR demonstrates appropriate alignment, interval healing Continue with NWB due to extensive fractures Additional prescription for pain medication Discussed treatment of constipation with colace and miralax; patient already has stool softener and laxative available.  Short leg cast applied F/u 4 weeks  Cast application - Left short leg cast   Verbal consent was obtained and the correct extremity was identified. A well padded, appropriately molded short leg cast was applied to the left leg Toes remained warm and well perfused.   There were no sharp edges Patient tolerated the procedure well Cast care instructions were provided    Meds ordered this encounter  Medications   oxyCODONE (ROXICODONE) 5 MG immediate release tablet    Sig: Take 1 tablet (5 mg total) by mouth every 6 (six) hours as needed for severe pain.    Dispense:  15 tablet    Refill:  0     Follow-up: Return in about 2 weeks (around 03/24/2021). XR at next visit: L foot, out of cast  Subjective:  Chief Complaint  Patient presents with   Foot Pain    L/bothering me some not too much though.     History of Present Illness: Kim [name] is a 80 y.o. female who presents following the above stated procedure.  Overall, she has done well since surgery.  Her pain has been controlled.  She tolerated the splint well, although she did note some irritation of the proximal aspect.  She has been elevating her foot as much as possible.  No issues otherwise.  No fevers or chills.  Review of Systems: No fevers or chills No numbness or tingling No Chest Pain No shortness of breath   Objective: There were no vitals taken for this visit.  Physical Exam:  Elderly  female, seated in a wheelchair. Alert and oriented, no acute distress.  Evaluation of the left foot demonstrates a well-healing surgical incision without surrounding erythema or drainage.  She does have extensive ecchymosis and swelling over the dorsum of her foot, particularly over the forefoot.  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  Slightly decreased sensation over the dorsum of the foot.  Active dorsiflexion of the TA and EHL.  IMAGING: I personally ordered and reviewed the following images:   X-ray of the left foot was obtained in clinic today and demonstrates maintenance of alignment of the first and second metatarsal fractures.  No evidence of hardware failure or loosening.  Fractures remain aligned.  The segmental fractures to the third and fourth metatarsal are without interval displacement.  Fracture of the fifth metatarsal has remained in acceptable position.  Impression: Healing metatarsal fractures to left foot, without hardware failure.  Mordecai Rasmussen, MD 03/10/2021 12:03 PM

## 2021-03-10 NOTE — Patient Instructions (Signed)

## 2021-03-24 ENCOUNTER — Ambulatory Visit (INDEPENDENT_AMBULATORY_CARE_PROVIDER_SITE_OTHER): Payer: 59 | Admitting: Orthopedic Surgery

## 2021-03-24 ENCOUNTER — Ambulatory Visit: Payer: 59

## 2021-03-24 ENCOUNTER — Other Ambulatory Visit: Payer: Self-pay

## 2021-03-24 ENCOUNTER — Encounter: Payer: Self-pay | Admitting: Orthopedic Surgery

## 2021-03-24 DIAGNOSIS — S92315D Nondisplaced fracture of first metatarsal bone, left foot, subsequent encounter for fracture with routine healing: Secondary | ICD-10-CM

## 2021-03-24 DIAGNOSIS — S92322D Displaced fracture of second metatarsal bone, left foot, subsequent encounter for fracture with routine healing: Secondary | ICD-10-CM

## 2021-03-24 DIAGNOSIS — S92345D Nondisplaced fracture of fourth metatarsal bone, left foot, subsequent encounter for fracture with routine healing: Secondary | ICD-10-CM

## 2021-03-24 DIAGNOSIS — S92335D Nondisplaced fracture of third metatarsal bone, left foot, subsequent encounter for fracture with routine healing: Secondary | ICD-10-CM

## 2021-03-24 DIAGNOSIS — S92355D Nondisplaced fracture of fifth metatarsal bone, left foot, subsequent encounter for fracture with routine healing: Secondary | ICD-10-CM

## 2021-03-24 NOTE — Progress Notes (Addendum)
Orthopaedic Postop Note  Assessment: Kim [name] is a 80 y.o. female s/p ORIF of L 1st and 2nd metatarsal fracture; nonoperative management of L 3rd, 4th and 5th metatarsal shaft fractures  DOS: 02/27/2021  Plan: Cast was removed, and repeat radiographs demonstrates interval healing of all fractures.  There has been no hardware failure.  She does continue to have significant swelling over the dorsum of the foot especially of the forefoot.  We discussed placing another cast in clinic today, but the patient was insistent.  She prefers a walking boot if possible.  As result, she was fitted for a Cam walker boot today.  She was advised to remain nonweightbearing for at least another 2 weeks.  After which time, she can start to bear some weight on her left foot.  She stated her understanding.  We will see her back in 4 weeks for repeat evaluation.  None at that time, active but as she will be transitioning out of the boot, and into a regular shoe with progressively improving gait.  Follow-up: Return in about 4 weeks (around 04/21/2021). XR at next visit: L foot, out of cast  Subjective:  Chief Complaint  Patient presents with   Foot Pain    L/doing pretty good.    History of Present Illness: Kim [name] is a 81 y.o. female who presents following the above stated procedure.  She continues to do well.  She is no longer taking pain medication.  She has tolerated the cast, but she is hoping to avoid another cast.  She has been elevating the foot as much as possible.  Review of Systems: No fevers or chills No numbness or tingling No Chest Pain No shortness of breath   Objective: There were no vitals taken for this visit.  Physical Exam:  Elderly female, seated in a wheelchair. Alert and oriented, no acute distress.  Evaluation of the left foot demonstrates continued swelling of the dorsum of the foot in the forefoot area.  Sensation is intact over the dorsum of the foot.  She has  some residual scabbing over the dorsum of her foot.  Ecchymosis is appreciated.  Toes are warm and well perfused..  IMAGING: I personally ordered and reviewed the following images:   X-rays of the left foot were obtained in clinic today and demonstrates interval consolidation at the fracture sites to all metatarsals.  The hardware remains intact, without evidence of failure or subsidence.  There is been no significant residual displacement since the last x-rays.  Impression: Healing metatarsal fractures to left foot, without hardware failure.  Mordecai Rasmussen, MD 03/24/2021 11:29 PM

## 2021-03-31 ENCOUNTER — Other Ambulatory Visit: Payer: Self-pay | Admitting: Orthopedic Surgery

## 2021-03-31 MED ORDER — OXYCODONE HCL 5 MG PO TABS: 5.0000 mg | ORAL_TABLET | Freq: Four times a day (QID) | ORAL | 0 refills | Status: DC | PRN

## 2021-03-31 NOTE — Telephone Encounter (Signed)
Patient called to request: oxyCODONE (ROXICODONE) 5 MG immediate release tablet  [employer]

## 2021-04-25 ENCOUNTER — Other Ambulatory Visit: Payer: Self-pay

## 2021-04-25 ENCOUNTER — Ambulatory Visit (INDEPENDENT_AMBULATORY_CARE_PROVIDER_SITE_OTHER): Payer: 59 | Admitting: Orthopedic Surgery

## 2021-04-25 ENCOUNTER — Encounter: Payer: Self-pay | Admitting: Orthopedic Surgery

## 2021-04-25 ENCOUNTER — Ambulatory Visit: Payer: 59

## 2021-04-25 VITALS — Ht 64.0 in | Wt 189.0 lb

## 2021-04-25 DIAGNOSIS — S92355D Nondisplaced fracture of fifth metatarsal bone, left foot, subsequent encounter for fracture with routine healing: Secondary | ICD-10-CM

## 2021-04-25 DIAGNOSIS — S92322D Displaced fracture of second metatarsal bone, left foot, subsequent encounter for fracture with routine healing: Secondary | ICD-10-CM

## 2021-04-25 DIAGNOSIS — S92345D Nondisplaced fracture of fourth metatarsal bone, left foot, subsequent encounter for fracture with routine healing: Secondary | ICD-10-CM

## 2021-04-25 DIAGNOSIS — S92335D Nondisplaced fracture of third metatarsal bone, left foot, subsequent encounter for fracture with routine healing: Secondary | ICD-10-CM

## 2021-04-25 DIAGNOSIS — S92315D Nondisplaced fracture of first metatarsal bone, left foot, subsequent encounter for fracture with routine healing: Secondary | ICD-10-CM | POA: Diagnosis not present

## 2021-04-25 NOTE — Progress Notes (Addendum)
Orthopaedic Postop Note  Assessment: Kim [name] is a 80 y.o. female s/p ORIF of L 1st and 2nd metatarsal fracture; nonoperative management of L 3rd, 4th and 5th metatarsal shaft fractures  DOS: 02/27/2021  Plan: Repeat radiographs demonstrates adequate alignment and interval healing.  There were 2 sutures buried under the skin, these were removed.  Ok to progressively bear more weight on the left foot. She can transition to a regular shoe.  Burning sensation likely secondary to swelling and trauma from injury.  Anticipate continued improvement.  Also placed a referral for physical therapy.    Follow-up: Return for 2-3 months. XR at next visit: L foot  Subjective:  Chief Complaint  Patient presents with   Routine Post Op    Lt foot fracture DOS 02/27/21   Medication Refill    Would like to get a few more oxycodone if possible.    History of Present Illness: Kim [name] is a 80 y.o. female who presents following the above stated procedure. She is 2 months out from the surgery.  Her pain has improved.  She is taking pain medication intermittently.  She has been bearing weight using a walker.  She does not like the walking boot as it causes irritation and burning over the medial foot.  No issues with her surgical incisions.  She has not tried wearing a regular shoe.   Review of Systems: No fevers or chills No numbness or tingling No Chest Pain No shortness of breath +burning medial foot   Objective: Ht 5\' 4"  (1.626 m)   Wt 189 lb (85.7 kg)   BMI 32.44 kg/m   Physical Exam:  Elderly female, seated in a wheelchair. Alert and oriented, no acute distress.  Left foot with improved swelling.  2 small, prominent pin holes medial to incision.  Suture material removed from these holes.  Minimal tenderness along distal metatarsals.  Tolerates dorsiflexion to plantigrade.  Intact sensation, toes are warm and well perfused.  Progressive flat foot deformity.   IMAGING: I  personally ordered and reviewed the following images:   X-rays of the left foot were obtained in clinic today and demonstrates interval consolidation of multiple metatarsal fractures.  Maintained alignment of 1st and 2nd metatarsals.  No hardware failure.   Impression: Healing metatarsal fractures to left foot, without hardware failure.  Unchanged from most recent XR.   Mordecai Rasmussen, MD 04/25/2021 11:04 AM

## 2021-05-04 ENCOUNTER — Ambulatory Visit (HOSPITAL_COMMUNITY): Payer: 59 | Attending: Orthopedic Surgery | Admitting: Physical Therapy

## 2021-05-04 ENCOUNTER — Encounter (HOSPITAL_COMMUNITY): Payer: Self-pay | Admitting: Physical Therapy

## 2021-05-04 ENCOUNTER — Other Ambulatory Visit: Payer: Self-pay

## 2021-05-04 DIAGNOSIS — R2689 Other abnormalities of gait and mobility: Secondary | ICD-10-CM | POA: Diagnosis present

## 2021-05-04 DIAGNOSIS — R262 Difficulty in walking, not elsewhere classified: Secondary | ICD-10-CM | POA: Diagnosis present

## 2021-05-04 DIAGNOSIS — M25572 Pain in left ankle and joints of left foot: Secondary | ICD-10-CM | POA: Diagnosis present

## 2021-05-04 NOTE — Therapy (Signed)
[facility] [address] [place], [place], [address] Phone: [phone]   Fax:  [phone]  Physical Therapy Evaluation  Patient Details  Name: Kim [name] MRN: [id number] Date of Birth: Aug 29, 1941 Referring Provider (PT): Larena Glassman MD   Encounter Date: 05/04/2021   PT End of Session - 05/04/21 1155    Visit Number 1    Number of Visits 12    Date for PT Re-Evaluation 06/15/21    Authorization Type UHC Medicare/ Medicaid 2ndary    Progress Note Due on Visit 10    PT Start Time 1112    PT Stop Time 1155    PT Time Calculation (min) 43 min    Equipment Utilized During Treatment Gait belt    Activity Tolerance Patient tolerated treatment well    Behavior During Therapy [facility] for tasks assessed/performed           Past Medical History:  Diagnosis Date  . Acid reflux   . Arthritis   . Back spasm   . Carpal tunnel syndrome   . Hypertension   . Rheumatoid arthritis [facility])     Past Surgical History:  Procedure Laterality Date  . KNEE SURGERY    . ORIF TOE FRACTURE Left 02/27/2021   Procedure: OPEN REDUCTION INTERNAL FIXATION (ORIF) METATARSAL (TOE) FRACTURE;  Surgeon: Mordecai Rasmussen, MD;  Location: [facility];  Service: Orthopedics;  Laterality: Left;  ORIF of 1st and 2nd metatarsal fractures  . TUBAL LIGATION      There were no vitals filed for this visit.    Subjective Assessment - 05/04/21 1123    Subjective Patient presents to therapy with complaint of LT foot pain. She had a fall on 02/13/21 and broke her toes she states. Patient presents in walker boot, ambulating with rollator. She had recent f/u with ortho MD and has been cleared to begin WB out of boot and is WBAT. Patient states she tried to walk with regular shoe but cannot do this yet as it is still too painful.    Pertinent History 1st metatarsal fx of LT foot 02/13/21    Limitations Standing;Walking;House hold activities    Patient Stated Goals Walk better     Currently in Pain? Yes    Pain Score 5     Pain Location Foot    Pain Orientation Left   dorsal   Pain Descriptors / Indicators Throbbing   stinging   Pain Type Acute pain    Pain Onset More than a month ago    Pain Frequency Constant    Aggravating Factors  walking, WB    Pain Relieving Factors meds, rest    Effect of Pain on Daily Activities Limit              [facility] PT Assessment - 05/04/21 0001      Assessment   Medical Diagnosis LT 1st metatarsal fx    Referring Provider (PT) Larena Glassman MD    Onset Date/Surgical Date 02/13/21    Prior Therapy No      Restrictions   Weight Bearing Restrictions --   WBAT     Balance Screen   Has the patient fallen in the past 6 months Yes    How many times? 1    Has the patient had a decrease in activity level because of a fear of falling?  No    Is the patient reluctant to leave their home because of a fear of falling?  No  Home Environment   Living Environment Private residence    Living Arrangements Children      Prior Function   Level of Independence Needs assistance with ADLs   Has caregiver 5 x week     Cognition   Overall Cognitive Status Within Functional Limits for tasks assessed      Observation/Other Assessments-Edema    Edema --   mod edema noted     Sensation   Light Touch --   Increased sensitivity to dorsum of LT foot     ROM / Strength   AROM / PROM / Strength AROM;Strength      AROM   AROM Assessment Site Ankle    Right/Left Ankle Right;Left    Right Ankle Dorsiflexion 0    Right Ankle Plantar Flexion 30    Left Ankle Dorsiflexion -5    Left Ankle Plantar Flexion 20      Strength   Strength Assessment Site Hip;Knee;Ankle    Right/Left Hip Right;Left    Right Hip Flexion 4/5    Left Hip Flexion 4-/5    Right/Left Knee Right;Left    Right Knee Extension 4+/5    Left Knee Extension 4-/5    Right/Left Ankle Right;Left    Left Ankle Dorsiflexion 3-/5    Left Ankle Plantar Flexion 3+/5    Left  Ankle Inversion 3-/5    Left Ankle Eversion 3+/5      Palpation   Palpation comment Noted TTP about dorsum of LT foot      Ambulation/Gait   Ambulation/Gait Yes    Ambulation/Gait Assistance 5: Supervision    Ambulation Distance (Feet) 100 Feet    Assistive device Rollator    Gait Pattern Decreased step length - right;Decreased step length - left;Decreased stride length;Decreased dorsiflexion - left;Trunk flexed    Ambulation Surface Level;Indoor    Gait Comments 2MWT                      Objective measurements completed on examination: See above findings.       [facility] Adult PT Treatment/Exercise - 05/04/21 0001      Exercises   Exercises Ankle      Ankle Exercises: Stretches   Gastroc Stretch 3 reps;20 seconds    Gastroc Stretch Limitations seated with towel      Ankle Exercises: Seated   Ankle Circles/Pumps AROM;Left;10 reps    Other Seated Ankle Exercises ankle RTB PF x15                  PT Education - 05/04/21 1125    Education Details on evaluation findings, POC and HEP    Person(s) Educated Patient    Methods Explanation;Handout    Comprehension Verbalized understanding            PT Short Term Goals - 05/04/21 1209      PT SHORT TERM GOAL #1   Title Patient will be independent with initial HEP and self-management strategies to improve functional outcomes    Time 2    Period Weeks    Status New    Target Date 05/18/21             PT Long Term Goals - 05/04/21 1803      PT LONG TERM GOAL #1   Title Patient will have ankle LT AROM at least 5 degrees of DF for improved functional mobility and stair ambulation    Time 6    Period Weeks  Status New    Target Date 06/15/21      PT LONG TERM GOAL #2   Title Patient will improve ankle MMT to at least 4/5 throughout for improved functional ability    Time 6    Period Weeks    Status New    Target Date 06/15/21      PT LONG TERM GOAL #3   Title Patient will be able to  ambulate at least 250 feet during 2MWT with LRAD to demonstrate improved ability to perform functional mobility and associated tasks.    Time 6    Period Weeks    Status New    Target Date 06/15/21      PT LONG TERM GOAL #4   Title Patient will be able to maintain semi tandem stance >20 seconds on BLEs to improve stability and reduce risk for falls    Time 6    Period Weeks    Status New    Target Date 06/15/21                  Plan - 05/04/21 1205    Clinical Impression Statement Patient is a 80 y.o. female who presents to physical therapy with complaint of LT foot pain s/p LT 1st metatarsal fx on 02/13/21. Patient demonstrates decreased strength, ROM restriction, balance deficits and gait abnormalities which are likely contributing to symptoms of pain and are negatively impacting patient ability to perform ADLs and functional mobility tasks. Patient will benefit from skilled physical therapy services to address these deficits to reduce pain, improve level of function with ADLs, functional mobility tasks, and reduce risk for falls.    Personal Factors and Comorbidities Age    Examination-Activity Limitations Stairs;Locomotion Level;Transfers    Examination-Participation Restrictions Yard Work;Community Activity;Cleaning    Stability/Clinical Decision Making Stable/Uncomplicated    Clinical Decision Making Low    Rehab Potential Good    PT Frequency 2x / week    PT Duration 6 weeks    PT Treatment/Interventions ADLs/Self Care Home Management;Aquatic Therapy;Canalith Repostioning;DME Instruction;Patient/family education;Orthotic Fit/Training;Gait training;Stair training;Functional mobility training;Electrical Stimulation;Cryotherapy;Dry needling;Passive range of motion;Spinal Manipulations;Joint Manipulations;Energy conservation;Taping;Splinting;Vasopneumatic Device;Manual techniques;Vestibular;Therapeutic activities;Iontophoresis 4mg /ml Dexamethasone;Manual lymph drainage;Balance  training;Therapeutic exercise;Moist Heat;Traction;Contrast Bath;Fluidtherapy;Neuromuscular re-education;Compression bandaging;Scar mobilization;Visual/perceptual remediation/compensation;Ultrasound;Parrafin;Biofeedback    PT Next Visit Plan Progress LE and ankle strength as tolerated. Manual for pain and edema. Add towel scrunch, BAPs, and hip strengthening next visit. Progress to weight shifts, gait and standing balance when ready.    PT Home Exercise Plan Eval: band PF, ankle circles, seated calf stretch    Consulted and Agree with Plan of Care Patient           Patient will benefit from skilled therapeutic intervention in order to improve the following deficits and impairments:     Visit Diagnosis: Pain in left ankle and joints of left foot  Other abnormalities of gait and mobility  Difficulty in walking, not elsewhere classified     Problem List Patient Active Problem List   Diagnosis Date Noted  . Uterovaginal prolapse, incomplete 08/06/2013  . LOW BACK PAIN 01/09/2010  . BACK PAIN, CHRONIC 01/09/2010  . 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    6:14 PM, 05/04/21 Josue Hector PT DPT  Physical Therapist with [facility]  [phone] [place] [address] [place], [place], [address] Phone: [phone]   Fax:  [phone]  Name: Kim [name] MRN: [id number] Date of Birth: 1941/04/11

## 2021-05-04 NOTE — Patient Instructions (Signed)
Access Code: [email or web address] URL: [email or web address] Date: 05/04/2021 Prepared by: Josue Hector  Exercises Long Sitting Ankle Plantar Flexion with Resistance - 3 x daily - 7 x weekly - 3 sets - 10 reps Seated Calf Stretch with Strap - 3 x daily - 7 x weekly - 1 sets - 3 reps - 30 second hold Seated Ankle Circles - 3 x daily - 7 x weekly - 3 sets - 10 reps

## 2021-05-10 ENCOUNTER — Other Ambulatory Visit: Payer: Self-pay

## 2021-05-10 ENCOUNTER — Encounter (HOSPITAL_COMMUNITY): Payer: Self-pay

## 2021-05-10 ENCOUNTER — Ambulatory Visit (HOSPITAL_COMMUNITY): Payer: 59

## 2021-05-10 DIAGNOSIS — R262 Difficulty in walking, not elsewhere classified: Secondary | ICD-10-CM

## 2021-05-10 DIAGNOSIS — R2689 Other abnormalities of gait and mobility: Secondary | ICD-10-CM

## 2021-05-10 DIAGNOSIS — M25572 Pain in left ankle and joints of left foot: Secondary | ICD-10-CM | POA: Diagnosis not present

## 2021-05-10 NOTE — Therapy (Signed)
[facility] [address] [place], [place], [address] Phone: [phone]   Fax:  [phone]  Physical Therapy Treatment  Patient Details  Name: Kim [name] MRN: [id number] Date of Birth: 1941-05-17 Referring Provider (PT): Larena Glassman MD   Encounter Date: 05/10/2021   PT End of Session - 05/10/21 1057    Visit Number 2    Number of Visits 12    Date for PT Re-Evaluation 06/15/21    Authorization Type UHC Medicare/ Medicaid 2ndary    Progress Note Due on Visit 10    PT Start Time 1050    PT Stop Time 1130    PT Time Calculation (min) 40 min    Activity Tolerance Patient tolerated treatment well    Behavior During Therapy [facility] for tasks assessed/performed           Past Medical History:  Diagnosis Date  . Acid reflux   . Arthritis   . Back spasm   . Carpal tunnel syndrome   . Hypertension   . Rheumatoid arthritis [facility])     Past Surgical History:  Procedure Laterality Date  . KNEE SURGERY    . ORIF TOE FRACTURE Left 02/27/2021   Procedure: OPEN REDUCTION INTERNAL FIXATION (ORIF) METATARSAL (TOE) FRACTURE;  Surgeon: Mordecai Rasmussen, MD;  Location: [facility];  Service: Orthopedics;  Laterality: Left;  ORIF of 1st and 2nd metatarsal fractures  . TUBAL LIGATION      There were no vitals filed for this visit.   Subjective Assessment - 05/10/21 1055    Subjective Pt arrived in CAM boot, reports she is unable to find a shoe that fits.  Reports pain is mild today, 2/10.  Stated she has began the HEP, foot is stiff today.    Pertinent History 1st metatarsal fx of LT foot 02/13/21    Patient Stated Goals Walk better    Currently in Pain? Yes    Pain Score 2     Pain Location Foot    Pain Orientation Left;Distal   plantar surface   Pain Descriptors / Indicators Aching;Tightness    Pain Type Acute pain    Pain Onset More than a month ago    Pain Frequency Intermittent    Aggravating Factors  walking, WB    Pain Relieving Factors  meds, rest    Effect of Pain on Daily Activities limits                             [facility] Adult PT Treatment/Exercise - 05/10/21 0001      Exercises   Exercises Ankle      Ankle Exercises: Seated   Ankle Circles/Pumps AROM;Left;10 reps    Towel Crunch 1 rep;2 reps    Towel Crunch Limitations 64min x 2    Towel Inversion/Eversion 1 rep    Towel Inversion/Eversion Limitations 1' each direction, therapist stabilizing knee    Marble Pickup 10 marbles    BAPS Sitting;Level 2;10 reps    Other Seated Ankle Exercises ankle RTB PF x15                  PT Education - 05/10/21 1104    Education Details Reviewed goals, educated importance of HEP compliance for maximal benefits, pt able to recall and demonstrate app    Person(s) Educated Patient    Methods Explanation;Demonstration    Comprehension Verbalized understanding  PT Short Term Goals - 05/04/21 1209      PT SHORT TERM GOAL #1   Title Patient will be independent with initial HEP and self-management strategies to improve functional outcomes    Time 2    Period Weeks    Status New    Target Date 05/18/21             PT Long Term Goals - 05/04/21 1803      PT LONG TERM GOAL #1   Title Patient will have ankle LT AROM at least 5 degrees of DF for improved functional mobility and stair ambulation    Time 6    Period Weeks    Status New    Target Date 06/15/21      PT LONG TERM GOAL #2   Title Patient will improve ankle MMT to at least 4/5 throughout for improved functional ability    Time 6    Period Weeks    Status New    Target Date 06/15/21      PT LONG TERM GOAL #3   Title Patient will be able to ambulate at least 250 feet during 2MWT with LRAD to demonstrate improved ability to perform functional mobility and associated tasks.    Time 6    Period Weeks    Status New    Target Date 06/15/21      PT LONG TERM GOAL #4   Title Patient will be able to maintain semi tandem  stance >20 seconds on BLEs to improve stability and reduce risk for falls    Time 6    Period Weeks    Status New    Target Date 06/15/21                 Plan - 05/10/21 1105    Clinical Impression Statement Reviewed goals, educated importance of HEP compliance for maximal benefits.  Pt able to recall and demonstrate appropriate mechanics with current exercise program, cueing to increased isometric holds time with PF exercises for maximal strengthening.  Session focus with ankle mobility and strengthening.  Pt with decreased ROM all directions, difficulty completing inversion/eversion motins without knee compensation, therapist stabilized knee during seated exercises.  Reviewed RICE techniques to assist with pain and edema control.  Pt reports she is unable to fit foot into shoes currently wearing CAM boot.  Add manual for edema control next session.    Personal Factors and Comorbidities Age    Examination-Activity Limitations Stairs;Locomotion Level;Transfers    Examination-Participation Restrictions Yard Work;Community Activity;Cleaning    Stability/Clinical Decision Making Stable/Uncomplicated    Clinical Decision Making Low    Rehab Potential Good    PT Frequency 2x / week    PT Duration 6 weeks    PT Treatment/Interventions ADLs/Self Care Home Management;Aquatic Therapy;Canalith Repostioning;DME Instruction;Patient/family education;Orthotic Fit/Training;Gait training;Stair training;Functional mobility training;Electrical Stimulation;Cryotherapy;Dry needling;Passive range of motion;Spinal Manipulations;Joint Manipulations;Energy conservation;Taping;Splinting;Vasopneumatic Device;Manual techniques;Vestibular;Therapeutic activities;Iontophoresis 4mg /ml Dexamethasone;Manual lymph drainage;Balance training;Therapeutic exercise;Moist Heat;Traction;Contrast Bath;Fluidtherapy;Neuromuscular re-education;Compression bandaging;Scar mobilization;Visual/perceptual  remediation/compensation;Ultrasound;Parrafin;Biofeedback    PT Next Visit Plan Progress LE and ankle strength as tolerated. Manual for pain and edema. Continue with towel scrunch, BAPs, and hip strengthening next visit. Progress to weight shifts, gait and standing balance when ready.    PT Home Exercise Plan Eval: band PF, ankle circles, seated calf stretch; 05/10/21: towel crunch.    Consulted and Agree with Plan of Care Patient           Patient will benefit from skilled therapeutic intervention in order  to improve the following deficits and impairments:     Visit Diagnosis: Pain in left ankle and joints of left foot  Other abnormalities of gait and mobility  Difficulty in walking, not elsewhere classified     Problem List Patient Active Problem List   Diagnosis Date Noted  . Uterovaginal prolapse, incomplete 08/06/2013  . LOW BACK PAIN 01/09/2010  . BACK PAIN, CHRONIC 01/09/2010  . 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   Ihor Austin, LPTA/CLT; CBIS [phone]  Aldona Lento 05/10/2021, 1:39 PM  [facility] [address] [place], [place], [address] Phone: [phone]   Fax:  [phone]  Name: Nathali Vent MRN: [id number] Date of Birth: 01-05-1941

## 2021-05-11 ENCOUNTER — Ambulatory Visit (HOSPITAL_COMMUNITY): Payer: 59 | Admitting: Physical Therapy

## 2021-05-11 DIAGNOSIS — R2689 Other abnormalities of gait and mobility: Secondary | ICD-10-CM

## 2021-05-11 DIAGNOSIS — M25572 Pain in left ankle and joints of left foot: Secondary | ICD-10-CM

## 2021-05-11 DIAGNOSIS — R262 Difficulty in walking, not elsewhere classified: Secondary | ICD-10-CM

## 2021-05-11 NOTE — Therapy (Signed)
[facility] [address] [place], [place], [address] Phone: [phone]   Fax:  [phone]  Physical Therapy Treatment  Patient Details  Name: Kim [name] MRN: [id number] Date of Birth: May 25, 1941 Referring Provider (PT): Larena Glassman MD   Encounter Date: 05/11/2021   PT End of Session - 05/11/21 1325    Visit Number 3    Number of Visits 12    Date for PT Re-Evaluation 06/15/21    Authorization Type UHC Medicare/ Medicaid 2ndary    Progress Note Due on Visit 10    PT Start Time 1050    PT Stop Time 1130    PT Time Calculation (min) 40 min    Activity Tolerance Patient tolerated treatment well    Behavior During Therapy [facility] for tasks assessed/performed           Past Medical History:  Diagnosis Date  . Acid reflux   . Arthritis   . Back spasm   . Carpal tunnel syndrome   . Hypertension   . Rheumatoid arthritis [facility])     Past Surgical History:  Procedure Laterality Date  . KNEE SURGERY    . ORIF TOE FRACTURE Left 02/27/2021   Procedure: OPEN REDUCTION INTERNAL FIXATION (ORIF) METATARSAL (TOE) FRACTURE;  Surgeon: Mordecai Rasmussen, MD;  Location: [facility];  Service: Orthopedics;  Laterality: Left;  ORIF of 1st and 2nd metatarsal fractures  . TUBAL LIGATION      There were no vitals filed for this visit.   Subjective Assessment - 05/11/21 1317    Subjective pt states it is still hurting bad at 8/10 and she is wearing her croc instead of the CAM boot.    Currently in Pain? Yes    Pain Score 8     Pain Location Foot    Pain Orientation Left    Pain Descriptors / Indicators Aching;Tightness                             [facility] Adult PT Treatment/Exercise - 05/11/21 0001      Exercises   Exercises Ankle      Manual Therapy   Manual Therapy Edema management    Manual therapy comments completed supine with elevation seperately from all other skilled interventions    Edema Management to reduce  edema/induration in Lt foot and ankle      Ankle Exercises: Stretches   Gastroc Stretch 3 reps;20 seconds    Gastroc Stretch Limitations seated with towel      Ankle Exercises: Seated   Ankle Circles/Pumps AROM;Left;10 reps    Towel Crunch Limitations    Towel Crunch Limitations 19min x 2    Towel Inversion/Eversion Limitations    Towel Inversion/Eversion Limitations 1' each direction, therapist stabilizing knee                    PT Short Term Goals - 05/04/21 1209      PT SHORT TERM GOAL #1   Title Patient will be independent with initial HEP and self-management strategies to improve functional outcomes    Time 2    Period Weeks    Status New    Target Date 05/18/21             PT Long Term Goals - 05/04/21 1803      PT LONG TERM GOAL #1   Title Patient will have ankle LT AROM at least 5 degrees  of DF for improved functional mobility and stair ambulation    Time 6    Period Weeks    Status New    Target Date 06/15/21      PT LONG TERM GOAL #2   Title Patient will improve ankle MMT to at least 4/5 throughout for improved functional ability    Time 6    Period Weeks    Status New    Target Date 06/15/21      PT LONG TERM GOAL #3   Title Patient will be able to ambulate at least 250 feet during 2MWT with LRAD to demonstrate improved ability to perform functional mobility and associated tasks.    Time 6    Period Weeks    Status New    Target Date 06/15/21      PT LONG TERM GOAL #4   Title Patient will be able to maintain semi tandem stance >20 seconds on BLEs to improve stability and reduce risk for falls    Time 6    Period Weeks    Status New    Target Date 06/15/21                 Plan - 05/11/21 1331    Clinical Impression Statement Pt comes today without CAM boot but with noted antalgia.  Pt had to use restroom when entered gym so unable to complete full program today.  Began session with seated therex to improve ROM and ankle  strength. Pt required cues to reduce knee compensation/isolate ankle.  Manual completed wiht elevation to help reduce pain and edema.  Pt reported overall reduction to 2/10 at end of session.    Personal Factors and Comorbidities Age    Examination-Activity Limitations Stairs;Locomotion Level;Transfers    Examination-Participation Restrictions Yard Work;Community Activity;Cleaning    Stability/Clinical Decision Making Stable/Uncomplicated    Rehab Potential Good    PT Frequency 2x / week    PT Duration 6 weeks    PT Treatment/Interventions ADLs/Self Care Home Management;Aquatic Therapy;Canalith Repostioning;DME Instruction;Patient/family education;Orthotic Fit/Training;Gait training;Stair training;Functional mobility training;Electrical Stimulation;Cryotherapy;Dry needling;Passive range of motion;Spinal Manipulations;Joint Manipulations;Energy conservation;Taping;Splinting;Vasopneumatic Device;Manual techniques;Vestibular;Therapeutic activities;Iontophoresis 4mg /ml Dexamethasone;Manual lymph drainage;Balance training;Therapeutic exercise;Moist Heat;Traction;Contrast Bath;Fluidtherapy;Neuromuscular re-education;Compression bandaging;Scar mobilization;Visual/perceptual remediation/compensation;Ultrasound;Parrafin;Biofeedback    PT Next Visit Plan Progress LE and ankle strength as tolerated. Manual for pain and edema. Continue with towel scrunch, BAPs, and hip strengthening next visit. Progress to weight shifts, gait and standing balance when ready.    PT Home Exercise Plan Eval: band PF, ankle circles, seated calf stretch; 05/10/21: towel crunch.    Consulted and Agree with Plan of Care Patient           Patient will benefit from skilled therapeutic intervention in order to improve the following deficits and impairments:     Visit Diagnosis: Pain in left ankle and joints of left foot  Other abnormalities of gait and mobility  Difficulty in walking, not elsewhere classified     Problem  List Patient Active Problem List   Diagnosis Date Noted  . Uterovaginal prolapse, incomplete 08/06/2013  . LOW BACK PAIN 01/09/2010  . BACK PAIN, CHRONIC 01/09/2010  . 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   Teena Irani, PTA/CLT [phone]  Teena Irani 05/11/2021, 1:38 PM  [facility] [address] [place], [place], [address] Phone: [phone]   Fax:  [phone]  Name: Kim [name] MRN: [id number] Date of Birth: 09/05/1941

## 2021-05-15 ENCOUNTER — Other Ambulatory Visit: Payer: Self-pay

## 2021-05-15 ENCOUNTER — Encounter (HOSPITAL_COMMUNITY): Payer: Self-pay | Admitting: Physical Therapy

## 2021-05-15 ENCOUNTER — Ambulatory Visit (HOSPITAL_COMMUNITY): Payer: 59 | Admitting: Physical Therapy

## 2021-05-15 DIAGNOSIS — R262 Difficulty in walking, not elsewhere classified: Secondary | ICD-10-CM

## 2021-05-15 DIAGNOSIS — M25572 Pain in left ankle and joints of left foot: Secondary | ICD-10-CM

## 2021-05-15 DIAGNOSIS — R2689 Other abnormalities of gait and mobility: Secondary | ICD-10-CM

## 2021-05-15 NOTE — Therapy (Signed)
[facility] [address] [place], [place], [address] Phone: [phone]   Fax:  [phone]  Physical Therapy Treatment  Patient Details  Name: Kim [name] MRN: [id number] Date of Birth: October 29, 1941 Referring Provider (PT): Larena Glassman MD   Encounter Date: 05/15/2021   PT End of Session - 05/15/21 1130    Visit Number 4    Number of Visits 12    Date for PT Re-Evaluation 06/15/21    Authorization Type UHC Medicare/ Medicaid 2ndary    Progress Note Due on Visit 10    PT Start Time 1127   late arrival   PT Stop Time 1158    PT Time Calculation (min) 31 min    Activity Tolerance Patient tolerated treatment well    Behavior During Therapy [facility] for tasks assessed/performed           Past Medical History:  Diagnosis Date  . Acid reflux   . Arthritis   . Back spasm   . Carpal tunnel syndrome   . Hypertension   . Rheumatoid arthritis [facility])     Past Surgical History:  Procedure Laterality Date  . KNEE SURGERY    . ORIF TOE FRACTURE Left 02/27/2021   Procedure: OPEN REDUCTION INTERNAL FIXATION (ORIF) METATARSAL (TOE) FRACTURE;  Surgeon: Mordecai Rasmussen, MD;  Location: [facility];  Service: Orthopedics;  Laterality: Left;  ORIF of 1st and 2nd metatarsal fractures  . TUBAL LIGATION      There were no vitals filed for this visit.   Subjective Assessment - 05/15/21 1130    Subjective Had some trouble over the weekend. HEP is going good.    Currently in Pain? Yes    Pain Score 5     Pain Location Foot    Pain Orientation Left    Pain Descriptors / Indicators Aching    Pain Type Acute pain    Pain Onset More than a month ago    Pain Frequency Intermittent                             [facility] Adult PT Treatment/Exercise - 05/15/21 0001      Ankle Exercises: Stretches   Gastroc Stretch 3 reps;30 seconds    Gastroc Stretch Limitations seated with towel      Ankle Exercises: Seated   Ankle Circles/Pumps 20 reps    CW/ CCW   BAPS Sitting;Level 1;15 reps   PF/ DF. EV (attempted circles)     Ankle Exercises: Supine   T-Band PF x 20    Other Supine Ankle Exercises SLR 2 x10      Ankle Exercises: Standing   Other Standing Ankle Exercises lateral weight shifting x 10, staggered standing 3 x 20" holds                    PT Short Term Goals - 05/04/21 1209      PT SHORT TERM GOAL #1   Title Patient will be independent with initial HEP and self-management strategies to improve functional outcomes    Time 2    Period Weeks    Status New    Target Date 05/18/21             PT Long Term Goals - 05/04/21 1803      PT LONG TERM GOAL #1   Title Patient will have ankle LT AROM at least 5 degrees of DF for improved  functional mobility and stair ambulation    Time 6    Period Weeks    Status New    Target Date 06/15/21      PT LONG TERM GOAL #2   Title Patient will improve ankle MMT to at least 4/5 throughout for improved functional ability    Time 6    Period Weeks    Status New    Target Date 06/15/21      PT LONG TERM GOAL #3   Title Patient will be able to ambulate at least 250 feet during 2MWT with LRAD to demonstrate improved ability to perform functional mobility and associated tasks.    Time 6    Period Weeks    Status New    Target Date 06/15/21      PT LONG TERM GOAL #4   Title Patient will be able to maintain semi tandem stance >20 seconds on BLEs to improve stability and reduce risk for falls    Time 6    Period Weeks    Status New    Target Date 06/15/21                 Plan - 05/15/21 1159    Clinical Impression Statement Patient tolerated session well overall today. Made good progression to standing activity today. Patient continues to be challenged with ankle mobility and had increased difficulty with circles on BAPs board, requiring increased verbal cues to avoid substituting movement for knee. Patient reports no increased ankle pain with added weight  shifts but does note LE fatigue with increased WB activity. Patient will continue to benefit from standing balance and strengthening progression to improve functional mobility.    Personal Factors and Comorbidities Age    Examination-Activity Limitations Stairs;Locomotion Level;Transfers    Examination-Participation Restrictions Yard Work;Community Activity;Cleaning    Stability/Clinical Decision Making Stable/Uncomplicated    Rehab Potential Good    PT Frequency 2x / week    PT Duration 6 weeks    PT Treatment/Interventions ADLs/Self Care Home Management;Aquatic Therapy;Canalith Repostioning;DME Instruction;Patient/family education;Orthotic Fit/Training;Gait training;Stair training;Functional mobility training;Electrical Stimulation;Cryotherapy;Dry needling;Passive range of motion;Spinal Manipulations;Joint Manipulations;Energy conservation;Taping;Splinting;Vasopneumatic Device;Manual techniques;Vestibular;Therapeutic activities;Iontophoresis 4mg /ml Dexamethasone;Manual lymph drainage;Balance training;Therapeutic exercise;Moist Heat;Traction;Contrast Bath;Fluidtherapy;Neuromuscular re-education;Compression bandaging;Scar mobilization;Visual/perceptual remediation/compensation;Ultrasound;Parrafin;Biofeedback    PT Next Visit Plan Progress LE and ankle strength as tolerated. Manual for pain and edema. Progress to gait and standing balance when ready.    PT Home Exercise Plan Eval: band PF, ankle circles, seated calf stretch; 05/10/21: towel crunch. 5/16 SLR, standing weight shifting side side/ side and front/ back    Consulted and Agree with Plan of Care Patient           Patient will benefit from skilled therapeutic intervention in order to improve the following deficits and impairments:     Visit Diagnosis: Pain in left ankle and joints of left foot  Other abnormalities of gait and mobility  Difficulty in walking, not elsewhere classified     Problem List Patient Active Problem List    Diagnosis Date Noted  . Uterovaginal prolapse, incomplete 08/06/2013  . LOW BACK PAIN 01/09/2010  . BACK PAIN, CHRONIC 01/09/2010  . 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   12:07 PM, 05/15/21 Josue Hector PT DPT  Physical Therapist with [facility]  [phone] [place] [address] [place], [place], [address] Phone: [phone]   Fax:  [phone]  Name: Kynzley Dowson MRN: [id number] Date of Birth:  06/01/1941   

## 2021-05-15 NOTE — Patient Instructions (Signed)
Access Code: [email or web address] URL: [email or web address] Date: 05/15/2021 Prepared by: Josue Hector  Exercises Supine Straight Leg Raises - 2 x daily - 7 x weekly - 2 sets - 10 reps Side to Side Weight Shift with Counter Support - 2 x daily - 7 x weekly - 2 sets - 10 reps Staggered Stance Forward Backward Weight Shift with Counter Support - 2 x daily - 7 x weekly - 2 sets - 10 reps

## 2021-05-18 ENCOUNTER — Encounter (HOSPITAL_COMMUNITY): Payer: Self-pay | Admitting: Physical Therapy

## 2021-05-18 ENCOUNTER — Other Ambulatory Visit: Payer: Self-pay

## 2021-05-18 ENCOUNTER — Ambulatory Visit (HOSPITAL_COMMUNITY): Payer: 59 | Admitting: Physical Therapy

## 2021-05-18 DIAGNOSIS — R262 Difficulty in walking, not elsewhere classified: Secondary | ICD-10-CM

## 2021-05-18 DIAGNOSIS — M25572 Pain in left ankle and joints of left foot: Secondary | ICD-10-CM

## 2021-05-18 DIAGNOSIS — R2689 Other abnormalities of gait and mobility: Secondary | ICD-10-CM

## 2021-05-18 NOTE — Therapy (Signed)
[facility] [address] [place], [place], [address] Phone: [phone]   Fax:  [phone]  Physical Therapy Treatment  Patient Details  Name: Kim [name] MRN: [id number] Date of Birth: 09-Nov-1941 Referring Provider (PT): Larena Glassman MD   Encounter Date: 05/18/2021   PT End of Session - 05/18/21 1033    Visit Number 5    Number of Visits 12    Date for PT Re-Evaluation 06/15/21    Authorization Type UHC Medicare/ Medicaid 2ndary    Progress Note Due on Visit 10    PT Start Time 1035    PT Stop Time 1115    PT Time Calculation (min) 40 min    Activity Tolerance Patient tolerated treatment well    Behavior During Therapy [facility] for tasks assessed/performed           Past Medical History:  Diagnosis Date  . Acid reflux   . Arthritis   . Back spasm   . Carpal tunnel syndrome   . Hypertension   . Rheumatoid arthritis [facility])     Past Surgical History:  Procedure Laterality Date  . KNEE SURGERY    . ORIF TOE FRACTURE Left 02/27/2021   Procedure: OPEN REDUCTION INTERNAL FIXATION (ORIF) METATARSAL (TOE) FRACTURE;  Surgeon: Mordecai Rasmussen, MD;  Location: [facility];  Service: Orthopedics;  Laterality: Left;  ORIF of 1st and 2nd metatarsal fractures  . TUBAL LIGATION      There were no vitals filed for this visit.   Subjective Assessment - 05/18/21 1038    Subjective Patient says she is walking better. HEP going ok. Still having some swelling. Pain not bad, about a 2 today.    Currently in Pain? Yes    Pain Score 2     Pain Location Foot    Pain Orientation Left    Pain Descriptors / Indicators Aching    Pain Type Acute pain    Pain Onset More than a month ago    Pain Frequency Intermittent                             [facility] Adult PT Treatment/Exercise - 05/18/21 0001      Ankle Exercises: Seated   Ankle Circles/Pumps 20 reps    Towel Crunch 5 reps    BAPS Sitting;Level 1;15 reps    Other Seated Ankle  Exercises ankle RTB PF x30    Other Seated Ankle Exercises towel inversion x20      Ankle Exercises: Stretches   Gastroc Stretch 3 reps;30 seconds    Gastroc Stretch Limitations seated with strap      Ankle Exercises: Standing   Other Standing Ankle Exercises semi tandem stance 2 x 30", gait training with small based quad cane 100 feet with cues for sequencing                    PT Short Term Goals - 05/04/21 1209      PT SHORT TERM GOAL #1   Title Patient will be independent with initial HEP and self-management strategies to improve functional outcomes    Time 2    Period Weeks    Status New    Target Date 05/18/21             PT Long Term Goals - 05/04/21 1803      PT LONG TERM GOAL #1   Title Patient will have ankle  LT AROM at least 5 degrees of DF for improved functional mobility and stair ambulation    Time 6    Period Weeks    Status New    Target Date 06/15/21      PT LONG TERM GOAL #2   Title Patient will improve ankle MMT to at least 4/5 throughout for improved functional ability    Time 6    Period Weeks    Status New    Target Date 06/15/21      PT LONG TERM GOAL #3   Title Patient will be able to ambulate at least 250 feet during 2MWT with LRAD to demonstrate improved ability to perform functional mobility and associated tasks.    Time 6    Period Weeks    Status New    Target Date 06/15/21      PT LONG TERM GOAL #4   Title Patient will be able to maintain semi tandem stance >20 seconds on BLEs to improve stability and reduce risk for falls    Time 6    Period Weeks    Status New    Target Date 06/15/21                 Plan - 05/18/21 1323    Clinical Impression Statement Patient tolerated session well overall today. Patient has continued difficulty with LT foot inversion due to anatomy and dropped navicular arch. Cued patient on BAPs board for ankle control and stabilizations. Patient doing well with static balance so progressed  to gait training with quad cane. Patient did well with this, but ambulates slowly and requires frequent verbal cues for sequencing of cane and step pattern. Will continue to progress as tolerated. Updated HEP handout.    Personal Factors and Comorbidities Age    Examination-Activity Limitations Stairs;Locomotion Level;Transfers    Examination-Participation Restrictions Yard Work;Community Activity;Cleaning    Stability/Clinical Decision Making Stable/Uncomplicated    Rehab Potential Good    PT Frequency 2x / week    PT Duration 6 weeks    PT Treatment/Interventions ADLs/Self Care Home Management;Aquatic Therapy;Canalith Repostioning;DME Instruction;Patient/family education;Orthotic Fit/Training;Gait training;Stair training;Functional mobility training;Electrical Stimulation;Cryotherapy;Dry needling;Passive range of motion;Spinal Manipulations;Joint Manipulations;Energy conservation;Taping;Splinting;Vasopneumatic Device;Manual techniques;Vestibular;Therapeutic activities;Iontophoresis 4mg /ml Dexamethasone;Manual lymph drainage;Balance training;Therapeutic exercise;Moist Heat;Traction;Contrast Bath;Fluidtherapy;Neuromuscular re-education;Compression bandaging;Scar mobilization;Visual/perceptual remediation/compensation;Ultrasound;Parrafin;Biofeedback    PT Next Visit Plan Progress LE and ankle strength as tolerated. Manual for pain and edema. Continue with gait and balance    PT Home Exercise Plan Eval: band PF, ankle circles, seated calf stretch; 05/10/21: towel crunch. 5/16 SLR, standing weight shifting side side/ side and front/ back 5/19 semi tandem stance    Consulted and Agree with Plan of Care Patient           Patient will benefit from skilled therapeutic intervention in order to improve the following deficits and impairments:     Visit Diagnosis: Pain in left ankle and joints of left foot  Other abnormalities of gait and mobility  Difficulty in walking, not elsewhere  classified     Problem List Patient Active Problem List   Diagnosis Date Noted  . Uterovaginal prolapse, incomplete 08/06/2013  . LOW BACK PAIN 01/09/2010  . BACK PAIN, CHRONIC 01/09/2010  . 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    1:25 PM, 05/18/21 Josue Hector PT DPT  Physical Therapist with [facility]  [phone] [facility] [address] [place], [place], [address] Phone: [phone]  Fax:  [phone]  Name: Kim [name] MRN: [id number] Date of Birth: [birth year]

## 2021-05-18 NOTE — Patient Instructions (Signed)
Access Code: [email or web address] URL: [email or web address] Date: 05/18/2021 Prepared by: Josue Hector  Exercises Standing Tandem Balance with Counter Support - 2-3 x daily - 7 x weekly - 1 sets - 3 reps - 20-30 second hold

## 2021-05-23 ENCOUNTER — Ambulatory Visit: Payer: Medicare Other | Admitting: Obstetrics & Gynecology

## 2021-05-23 ENCOUNTER — Ambulatory Visit (HOSPITAL_COMMUNITY): Payer: 59 | Admitting: Physical Therapy

## 2021-05-23 ENCOUNTER — Telehealth (HOSPITAL_COMMUNITY): Payer: Self-pay | Admitting: Physical Therapy

## 2021-05-23 NOTE — Telephone Encounter (Signed)
Pt did not show for appointment.  Called and spoke to patient who states her appointment was "marked out" on her sheet that was given to her by [place].  States it was marked in yellow highlighter so she thought she was not suppose to come to this one.  Explained she was suppose to come and when highlighted means active not cancelled.  Reminded pt of next appointment on Thursday.  Teena Irani, PTA/CLT [phone]

## 2021-05-25 ENCOUNTER — Ambulatory Visit (HOSPITAL_COMMUNITY): Payer: 59

## 2021-05-25 ENCOUNTER — Encounter (HOSPITAL_COMMUNITY): Payer: Self-pay

## 2021-05-25 ENCOUNTER — Other Ambulatory Visit: Payer: Self-pay

## 2021-05-25 DIAGNOSIS — R2689 Other abnormalities of gait and mobility: Secondary | ICD-10-CM

## 2021-05-25 DIAGNOSIS — M25572 Pain in left ankle and joints of left foot: Secondary | ICD-10-CM | POA: Diagnosis not present

## 2021-05-25 DIAGNOSIS — R262 Difficulty in walking, not elsewhere classified: Secondary | ICD-10-CM

## 2021-05-25 NOTE — Therapy (Signed)
[facility] [address] [place], [place], [address] Phone: [phone]   Fax:  [phone]  Physical Therapy Treatment  Patient Details  Name: Kim [name] MRN: [id number] Date of Birth: [birth year] Referring Provider (PT): Larena Glassman MD   Encounter Date: 05/25/2021   PT End of Session - 05/25/21 1012    Visit Number 6    Number of Visits 12    Date for PT Re-Evaluation 06/15/21    Authorization Type UHC Medicare/ Medicaid 2ndary    Progress Note Due on Visit 10    PT Start Time 1000    PT Stop Time 1042    PT Time Calculation (min) 42 min    Equipment Utilized During Treatment Gait belt    Activity Tolerance Patient tolerated treatment well    Behavior During Therapy [facility] for tasks assessed/performed           Past Medical History:  Diagnosis Date  . Acid reflux   . Arthritis   . Back spasm   . Carpal tunnel syndrome   . Hypertension   . Rheumatoid arthritis [facility])     Past Surgical History:  Procedure Laterality Date  . KNEE SURGERY    . ORIF TOE FRACTURE Left 02/27/2021   Procedure: OPEN REDUCTION INTERNAL FIXATION (ORIF) METATARSAL (TOE) FRACTURE;  Surgeon: Mordecai Rasmussen, MD;  Location: [facility];  Service: Orthopedics;  Laterality: Left;  ORIF of 1st and 2nd metatarsal fractures  . TUBAL LIGATION      There were no vitals filed for this visit.   Subjective Assessment - 05/25/21 1010    Subjective Pt stated she is feeling good today.  No reports of pain.  Reports she has been walking short distances around the house without AD.  No reports of recent fall or LOB.  Reports her ankle continues to swell wiht weight bearing.    Pertinent History 1st metatarsal fx of LT foot 02/13/21    Patient Stated Goals Walk better    Currently in Pain? No/denies                             [facility] Adult PT Treatment/Exercise - 05/25/21 0001      Ambulation/Gait   Ambulation/Gait Yes    Ambulation/Gait Assistance  4: Min guard    Ambulation Distance (Feet) 150 Feet    Assistive device Small based quad cane    Gait Pattern Decreased step length - right;Decreased step length - left;Decreased stride length;Decreased dorsiflexion - left;Trunk flexed    Ambulation Surface Level;Indoor    Gait Comments 3 point sequence      Exercises   Exercises Ankle      Manual Therapy   Manual Therapy Edema management    Manual therapy comments completed supine with elevation seperately from all other skilled interventions    Edema Management to reduce edema/induration in Lt foot and ankle      Ankle Exercises: Stretches   Gastroc Stretch 3 reps;30 seconds    Gastroc Stretch Limitations seated with strap      Ankle Exercises: Standing   Other Standing Ankle Exercises 3RTsidestep inside // bars with minimal HHA      Ankle Exercises: Seated   Ankle Circles/Pumps 20 reps    Towel Crunch Limitations 1 min x 2    Towel Inversion/Eversion Limitations windshield wipes 20 with LE elevated, tactile cueing to improve inversion    Heel Raises 20 reps  Toe Raise 20 reps                  PT Education - 05/25/21 1030    Education Details Educated benefits with compression hose for edema control, replace every 6 months and butler to assist with donning.    Person(s) Educated Patient    Methods Explanation;Demonstration;Handout    Comprehension Verbalized understanding            PT Short Term Goals - 05/04/21 1209      PT SHORT TERM GOAL #1   Title Patient will be independent with initial HEP and self-management strategies to improve functional outcomes    Time 2    Period Weeks    Status New    Target Date 05/18/21             PT Long Term Goals - 05/04/21 1803      PT LONG TERM GOAL #1   Title Patient will have ankle LT AROM at least 5 degrees of DF for improved functional mobility and stair ambulation    Time 6    Period Weeks    Status New    Target Date 06/15/21      PT LONG TERM  GOAL #2   Title Patient will improve ankle MMT to at least 4/5 throughout for improved functional ability    Time 6    Period Weeks    Status New    Target Date 06/15/21      PT LONG TERM GOAL #3   Title Patient will be able to ambulate at least 250 feet during 2MWT with LRAD to demonstrate improved ability to perform functional mobility and associated tasks.    Time 6    Period Weeks    Status New    Target Date 06/15/21      PT LONG TERM GOAL #4   Title Patient will be able to maintain semi tandem stance >20 seconds on BLEs to improve stability and reduce risk for falls    Time 6    Period Weeks    Status New    Target Date 06/15/21                 Plan - 05/25/21 1129    Clinical Impression Statement Pt educated on compression garments and use of butler to assist with donning for edema control.  Measurements taken and paperwork given for [employer]  Manual retrograde massage complete for edema control prior ROM and strengthening exercises.  Gait trianing with SBQC this session with minimal LOB, presents with slow cadence and cueing for sequence, increased stride length and to look up.  Added sidestep to improve stance phase during gait and hip strengthening, intermittent HHA required and cueing for posture through session.  No reports of pain through session.    Personal Factors and Comorbidities Age    Examination-Activity Limitations Stairs;Locomotion Level;Transfers    Examination-Participation Restrictions Yard Work;Community Activity;Cleaning    Stability/Clinical Decision Making Stable/Uncomplicated    Clinical Decision Making Low    Rehab Potential Good    PT Frequency 2x / week    PT Duration 6 weeks    PT Treatment/Interventions ADLs/Self Care Home Management;Aquatic Therapy;Canalith Repostioning;DME Instruction;Patient/family education;Orthotic Fit/Training;Gait training;Stair training;Functional mobility training;Electrical Stimulation;Cryotherapy;Dry  needling;Passive range of motion;Spinal Manipulations;Joint Manipulations;Energy conservation;Taping;Splinting;Vasopneumatic Device;Manual techniques;Vestibular;Therapeutic activities;Iontophoresis 4mg /ml Dexamethasone;Manual lymph drainage;Balance training;Therapeutic exercise;Moist Heat;Traction;Contrast Bath;Fluidtherapy;Neuromuscular re-education;Compression bandaging;Scar mobilization;Visual/perceptual remediation/compensation;Ultrasound;Parrafin;Biofeedback    PT Next Visit Plan Answer questions pertaining toward the compression garment/use of  butler.  Progress LE and ankle strength as tolerated. Manual for pain and edema. Continue with gait and balance    PT Home Exercise Plan Eval: band PF, ankle circles, seated calf stretch; 05/10/21: towel crunch. 5/16 SLR, standing weight shifting side side/ side and front/ back 5/19 semi tandem stance    Consulted and Agree with Plan of Care Patient           Patient will benefit from skilled therapeutic intervention in order to improve the following deficits and impairments:     Visit Diagnosis: Pain in left ankle and joints of left foot  Other abnormalities of gait and mobility  Difficulty in walking, not elsewhere classified     Problem List Patient Active Problem List   Diagnosis Date Noted  . Uterovaginal prolapse, incomplete 08/06/2013  . LOW BACK PAIN 01/09/2010  . BACK PAIN, CHRONIC 01/09/2010  . 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   Ihor Austin, LPTA/CLT; CBIS [id number]  Aldona Lento 05/25/2021, 1:13 PM  [facility] [address] [place], [place], [address] Phone: [phone]   Fax:  [phone]  Name: Kim [name] MRN: [id number] Date of Birth: February 11, 1941

## 2021-05-31 ENCOUNTER — Other Ambulatory Visit: Payer: Self-pay

## 2021-05-31 ENCOUNTER — Encounter (HOSPITAL_COMMUNITY): Payer: Self-pay | Admitting: Physical Therapy

## 2021-05-31 ENCOUNTER — Ambulatory Visit (HOSPITAL_COMMUNITY): Payer: 59 | Attending: Orthopedic Surgery | Admitting: Physical Therapy

## 2021-05-31 DIAGNOSIS — R262 Difficulty in walking, not elsewhere classified: Secondary | ICD-10-CM | POA: Diagnosis present

## 2021-05-31 DIAGNOSIS — M25572 Pain in left ankle and joints of left foot: Secondary | ICD-10-CM | POA: Diagnosis present

## 2021-05-31 DIAGNOSIS — R2689 Other abnormalities of gait and mobility: Secondary | ICD-10-CM | POA: Diagnosis present

## 2021-05-31 NOTE — Therapy (Signed)
[facility] [address] [place], [place], [address] Phone: [phone]   Fax:  [phone]  Physical Therapy Treatment  Patient Details  Name: Kim [name] MRN: [id number] Date of Birth: [birth year] Referring Provider (PT): Larena Glassman MD   Encounter Date: 05/31/2021   PT End of Session - 05/31/21 1041    Visit Number 7    Number of Visits 12    Date for PT Re-Evaluation 06/15/21    Authorization Type UHC Medicare/ Medicaid 2ndary    Progress Note Due on Visit 10    PT Start Time 1036    PT Stop Time 1120    PT Time Calculation (min) 44 min    Equipment Utilized During Treatment Gait belt    Activity Tolerance Patient tolerated treatment well    Behavior During Therapy WFL for tasks assessed/performed           Past Medical History:  Diagnosis Date  . Acid reflux   . Arthritis   . Back spasm   . Carpal tunnel syndrome   . Hypertension   . Rheumatoid arthritis [facility])     Past Surgical History:  Procedure Laterality Date  . KNEE SURGERY    . ORIF TOE FRACTURE Left 02/27/2021   Procedure: OPEN REDUCTION INTERNAL FIXATION (ORIF) METATARSAL (TOE) FRACTURE;  Surgeon: Mordecai Rasmussen, MD;  Location: [facility];  Service: Orthopedics;  Laterality: Left;  ORIF of 1st and 2nd metatarsal fractures  . TUBAL LIGATION      There were no vitals filed for this visit.   Subjective Assessment - 05/31/21 1041    Subjective Patient says things are good. No new complaints.    Pertinent History 1st metatarsal fx of LT foot 02/13/21    Patient Stated Goals Walk better    Currently in Pain? No/denies                             [facility] Adult PT Treatment/Exercise - 05/31/21 0001      Exercises   Exercises Knee/Hip      Knee/Hip Exercises: Standing   Forward Step Up Left;1 set;15 reps;Hand Hold: 2;Step Height: 2"    Gait Training 200 feet with SBCQ cues for step through pattern    Other Standing Knee Exercises semi tandem  stance 2 x 30" each      Manual Therapy   Manual Therapy Passive ROM;Joint mobilization    Manual therapy comments completed supine with elevation seperately from all other skilled interventions    Joint Mobilization mid talar mobs grade II for pain and mobility    Passive ROM LT ankle inv and DF PROM      Ankle Exercises: Seated   Ankle Circles/Pumps 20 reps    BAPS Sitting;15 reps;Level 2    Other Seated Ankle Exercises ankle GTB PF x30    Other Seated Ankle Exercises towel inversion x20                    PT Short Term Goals - 05/04/21 1209      PT SHORT TERM GOAL #1   Title Patient will be independent with initial HEP and self-management strategies to improve functional outcomes    Time 2    Period Weeks    Status New    Target Date 05/18/21             PT Long Term Goals - 05/04/21 1803  PT LONG TERM GOAL #1   Title Patient will have ankle LT AROM at least 5 degrees of DF for improved functional mobility and stair ambulation    Time 6    Period Weeks    Status New    Target Date 06/15/21      PT LONG TERM GOAL #2   Title Patient will improve ankle MMT to at least 4/5 throughout for improved functional ability    Time 6    Period Weeks    Status New    Target Date 06/15/21      PT LONG TERM GOAL #3   Title Patient will be able to ambulate at least 250 feet during 2MWT with LRAD to demonstrate improved ability to perform functional mobility and associated tasks.    Time 6    Period Weeks    Status New    Target Date 06/15/21      PT LONG TERM GOAL #4   Title Patient will be able to maintain semi tandem stance >20 seconds on BLEs to improve stability and reduce risk for falls    Time 6    Period Weeks    Status New    Target Date 06/15/21                 Plan - 05/31/21 1131    Clinical Impression Statement Patient showing improved gait with quad cane, with improved sequencing but still requires cues for increased step through.  Increased to LV 2 BAPS and progressed ankle PF to green resistance band. Patient continues to have some difficulty with ankle inversion AROM likely due to anatomy. Performed manual PROM on mobilizations to improve ankle mobility for gait mechanics. Patient will continue to benefit from skilled therapy services to reduce deficits and improve functional ability.    Personal Factors and Comorbidities Age    Examination-Activity Limitations Stairs;Locomotion Level;Transfers    Examination-Participation Restrictions Yard Work;Community Activity;Cleaning    Stability/Clinical Decision Making Stable/Uncomplicated    Rehab Potential Good    PT Frequency 2x / week    PT Duration 6 weeks    PT Treatment/Interventions ADLs/Self Care Home Management;Aquatic Therapy;Canalith Repostioning;DME Instruction;Patient/family education;Orthotic Fit/Training;Gait training;Stair training;Functional mobility training;Electrical Stimulation;Cryotherapy;Dry needling;Passive range of motion;Spinal Manipulations;Joint Manipulations;Energy conservation;Taping;Splinting;Vasopneumatic Device;Manual techniques;Vestibular;Therapeutic activities;Iontophoresis 4mg /ml Dexamethasone;Manual lymph drainage;Balance training;Therapeutic exercise;Moist Heat;Traction;Contrast Bath;Fluidtherapy;Neuromuscular re-education;Compression bandaging;Scar mobilization;Visual/perceptual remediation/compensation;Ultrasound;Parrafin;Biofeedback    PT Next Visit Plan Progress LE and ankle strength as tolerated. Manual for pain and edema. Continue with gait and balance    PT Home Exercise Plan Eval: band PF, ankle circles, seated calf stretch; 05/10/21: towel crunch. 5/16 SLR, standing weight shifting side side/ side and front/ back 5/19 semi tandem stance    Consulted and Agree with Plan of Care Patient           Patient will benefit from skilled therapeutic intervention in order to improve the following deficits and impairments:     Visit  Diagnosis: Pain in left ankle and joints of left foot  Other abnormalities of gait and mobility  Difficulty in walking, not elsewhere classified     Problem List Patient Active Problem List   Diagnosis Date Noted  . Uterovaginal prolapse, incomplete 08/06/2013  . LOW BACK PAIN 01/09/2010  . BACK PAIN, CHRONIC 01/09/2010  . 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   11:50 AM, 05/31/21 Josue Hector PT DPT  Physical Therapist with [facility]  [phone]   [facility] [address] [place], [place], [address] Phone: [phone]   Fax:  [phone]  Name: Kim [name] MRN: [id number] Date of Birth: Dec 17, 1941

## 2021-06-02 ENCOUNTER — Ambulatory Visit (HOSPITAL_COMMUNITY): Payer: 59 | Admitting: Physical Therapy

## 2021-06-02 ENCOUNTER — Other Ambulatory Visit: Payer: Self-pay

## 2021-06-02 DIAGNOSIS — M25572 Pain in left ankle and joints of left foot: Secondary | ICD-10-CM

## 2021-06-02 DIAGNOSIS — R2689 Other abnormalities of gait and mobility: Secondary | ICD-10-CM

## 2021-06-02 DIAGNOSIS — R262 Difficulty in walking, not elsewhere classified: Secondary | ICD-10-CM

## 2021-06-02 NOTE — Therapy (Signed)
[facility] [address] [place], [place], [address] Phone: [phone]   Fax:  [phone]  Physical Therapy Treatment  Patient Details  Name: Kim [name] MRN: [id number] Date of Birth: 1941/08/13 Referring Provider (PT): Larena Glassman MD   Encounter Date: 06/02/2021   PT End of Session - 06/02/21 1233    Number of Visits 12    Date for PT Re-Evaluation 06/15/21    Authorization Type UHC Medicare/ Medicaid 2ndary    Progress Note Due on Visit 10    PT Start Time 1138    PT Stop Time 1218    PT Time Calculation (min) 40 min    Equipment Utilized During Treatment Gait belt    Activity Tolerance Patient tolerated treatment well    Behavior During Therapy [facility] for tasks assessed/performed           Past Medical History:  Diagnosis Date  . Acid reflux   . Arthritis   . Back spasm   . Carpal tunnel syndrome   . Hypertension   . Rheumatoid arthritis [facility])     Past Surgical History:  Procedure Laterality Date  . KNEE SURGERY    . ORIF TOE FRACTURE Left 02/27/2021   Procedure: OPEN REDUCTION INTERNAL FIXATION (ORIF) METATARSAL (TOE) FRACTURE;  Surgeon: Mordecai Rasmussen, MD;  Location: [facility];  Service: Orthopedics;  Laterality: Left;  ORIF of 1st and 2nd metatarsal fractures  . TUBAL LIGATION      There were no vitals filed for this visit.                      [facility] Adult PT Treatment/Exercise - 06/02/21 0001      Knee/Hip Exercises: Stretches   Gastroc Stretch Both;3 reps;30 seconds    Gastroc Stretch Limitations slant board      Knee/Hip Exercises: Standing   Heel Raises 10 reps    Heel Raises Limitations toeraises 10X    Lateral Step Up Left;10 reps;Hand Hold: 1;Step Height: 4"    Forward Step Up Left;10 reps;Hand Hold: 1;Step Height: 4"    Rocker Board 2 minutes    Rocker Board Limitations Rt/Lt with therapist assist    Gait Training 200 feet with SBCQ cues for step through pattern      Manual  Therapy   Manual Therapy Passive ROM;Soft tissue mobilization    Manual therapy comments completed supine with elevation seperately from all other skilled interventions    Soft tissue mobilization to tight structures perimeter and mobilzation of tissue/joints    Passive ROM LT ankle inv and DF PROM      Additional Ankle Exercises DO NOT USE   Towel Crunch Limitations 1 min x 2      Ankle Exercises: Seated   Towel Inversion/Eversion Limitations windshield wipes 20 with LE elevated, tactile cueing to improve inversion                    PT Short Term Goals - 05/04/21 1209      PT SHORT TERM GOAL #1   Title Patient will be independent with initial HEP and self-management strategies to improve functional outcomes    Time 2    Period Weeks    Status New    Target Date 05/18/21             PT Long Term Goals - 05/04/21 1803      PT LONG TERM GOAL #1   Title Patient will  have ankle LT AROM at least 5 degrees of DF for improved functional mobility and stair ambulation    Time 6    Period Weeks    Status New    Target Date 06/15/21      PT LONG TERM GOAL #2   Title Patient will improve ankle MMT to at least 4/5 throughout for improved functional ability    Time 6    Period Weeks    Status New    Target Date 06/15/21      PT LONG TERM GOAL #3   Title Patient will be able to ambulate at least 250 feet during 2MWT with LRAD to demonstrate improved ability to perform functional mobility and associated tasks.    Time 6    Period Weeks    Status New    Target Date 06/15/21      PT LONG TERM GOAL #4   Title Patient will be able to maintain semi tandem stance >20 seconds on BLEs to improve stability and reduce risk for falls    Time 6    Period Weeks    Status New    Target Date 06/15/21                 Plan - 06/02/21 1234    Clinical Impression Statement Progressed to standing exercises this session with addition of heel/toeraises, lateral weight  shifting and gastroc stretch.  Pt required manual assist from therapist to stabilize during lateral weight shift on rockerboard.  She was unable to complete without full body movements.  Increased step height to 4" today with no issues and ability to complete with 1 HHA. Most difficulty completing inversion as foot remains in an everted position from neutral. Manual continued to forefoot and ankle.  Palpable area where possibly a pin/screw backing out; pt reported most discomfort in this area.  Instructed to bring this to MD attention week after next when she returns.    Personal Factors and Comorbidities Age    Examination-Activity Limitations Stairs;Locomotion Level;Transfers    Examination-Participation Restrictions Yard Work;Community Activity;Cleaning    Stability/Clinical Decision Making Stable/Uncomplicated    Rehab Potential Good    PT Frequency 2x / week    PT Duration 6 weeks    PT Treatment/Interventions ADLs/Self Care Home Management;Aquatic Therapy;Canalith Repostioning;DME Instruction;Patient/family education;Orthotic Fit/Training;Gait training;Stair training;Functional mobility training;Electrical Stimulation;Cryotherapy;Dry needling;Passive range of motion;Spinal Manipulations;Joint Manipulations;Energy conservation;Taping;Splinting;Vasopneumatic Device;Manual techniques;Vestibular;Therapeutic activities;Iontophoresis 4mg /ml Dexamethasone;Manual lymph drainage;Balance training;Therapeutic exercise;Moist Heat;Traction;Contrast Bath;Fluidtherapy;Neuromuscular re-education;Compression bandaging;Scar mobilization;Visual/perceptual remediation/compensation;Ultrasound;Parrafin;Biofeedback    PT Next Visit Plan Progress LE and ankle strength as tolerated. Manual for pain and edema. Continue with gait and balance.    PT Home Exercise Plan Eval: band PF, ankle circles, seated calf stretch; 05/10/21: towel crunch. 5/16 SLR, standing weight shifting side side/ side and front/ back 5/19 semi tandem  stance    Consulted and Agree with Plan of Care Patient           Patient will benefit from skilled therapeutic intervention in order to improve the following deficits and impairments:     Visit Diagnosis: Pain in left ankle and joints of left foot  Other abnormalities of gait and mobility  Difficulty in walking, not elsewhere classified     Problem List Patient Active Problem List   Diagnosis Date Noted  . Uterovaginal prolapse, incomplete 08/06/2013  . LOW BACK PAIN 01/09/2010  . BACK PAIN, CHRONIC 01/09/2010  . 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   Teena Irani, PTA/CLT [place],  Bobie Caris B 06/02/2021, 12:36 PM  [facility] [address] [place], [place], [address] Phone: [phone]   Fax:  [phone]  Name: Kim [name] MRN: [id number] Date of Birth: [birth year]

## 2021-06-05 ENCOUNTER — Ambulatory Visit (HOSPITAL_COMMUNITY): Payer: 59 | Admitting: Physical Therapy

## 2021-06-05 ENCOUNTER — Other Ambulatory Visit: Payer: Self-pay

## 2021-06-05 ENCOUNTER — Encounter (HOSPITAL_COMMUNITY): Payer: Self-pay | Admitting: Physical Therapy

## 2021-06-05 DIAGNOSIS — R262 Difficulty in walking, not elsewhere classified: Secondary | ICD-10-CM

## 2021-06-05 DIAGNOSIS — M25572 Pain in left ankle and joints of left foot: Secondary | ICD-10-CM | POA: Diagnosis not present

## 2021-06-05 DIAGNOSIS — R2689 Other abnormalities of gait and mobility: Secondary | ICD-10-CM

## 2021-06-05 NOTE — Therapy (Signed)
[facility] [address] [place], [place], [address] Phone: [phone]   Fax:  [phone]  Physical Therapy Treatment  Patient Details  Name: Kim [name] MRN: [id number] Date of Birth: [birth year] Referring Provider (PT): Larena Glassman MD   Encounter Date: 06/05/2021   PT End of Session - 06/05/21 1039    Visit Number 9    Number of Visits 12    Date for PT Re-Evaluation 06/15/21    Authorization Type UHC Medicare/ Medicaid 2ndary    Progress Note Due on Visit 10    PT Start Time 1033    PT Stop Time 1115    PT Time Calculation (min) 42 min    Equipment Utilized During Treatment Gait belt    Activity Tolerance Patient tolerated treatment well    Behavior During Therapy  [facility] for tasks assessed/performed           Past Medical History:  Diagnosis Date  . Acid reflux   . Arthritis   . Back spasm   . Carpal tunnel syndrome   . Hypertension   . Rheumatoid arthritis [facility])     Past Surgical History:  Procedure Laterality Date  . KNEE SURGERY    . ORIF TOE FRACTURE Left 02/27/2021   Procedure: OPEN REDUCTION INTERNAL FIXATION (ORIF) METATARSAL (TOE) FRACTURE;  Surgeon: Mordecai Rasmussen, MD;  Location: [facility];  Service: Orthopedics;  Laterality: Left;  ORIF of 1st and 2nd metatarsal fractures  . TUBAL LIGATION      There were no vitals filed for this visit.   Subjective Assessment - 06/05/21 1038    Subjective Doing well. No pain currently. Toes are a little tight today.    Pertinent History 1st metatarsal fx of LT foot 02/13/21    Patient Stated Goals Walk better    Currently in Pain? No/denies                             [facility] Adult PT Treatment/Exercise - 06/05/21 0001      Knee/Hip Exercises: Standing   Heel Raises Both;2 sets;10 reps    Lateral Step Up Left;Hand Hold: 1;Step Height: 4";15 reps    Forward Step Up Left;Hand Hold: 1;Step Height: 4";15 reps;2 sets;10 reps;Step Height: 6"   1st set 4 inch,  2nd set 6 inch   Gait Training 226 feet with SBQC with cues for step length      Ankle Exercises: Seated   Ankle Circles/Pumps 20 reps   CW/CCW   BAPS Sitting;15 reps;Level 2    Other Seated Ankle Exercises ankle PF with GTB x 30      Ankle Exercises: Stretches   Gastroc Stretch 3 reps;30 seconds    Gastroc Stretch Limitations on incline slope                    PT Short Term Goals - 05/04/21 1209      PT SHORT TERM GOAL #1   Title Patient will be independent with initial HEP and self-management strategies to improve functional outcomes    Time 2    Period Weeks    Status New    Target Date 05/18/21             PT Long Term Goals - 05/04/21 1803      PT LONG TERM GOAL #1   Title Patient will have ankle LT AROM at least 5 degrees of DF for improved  functional mobility and stair ambulation    Time 6    Period Weeks    Status New    Target Date 06/15/21      PT LONG TERM GOAL #2   Title Patient will improve ankle MMT to at least 4/5 throughout for improved functional ability    Time 6    Period Weeks    Status New    Target Date 06/15/21      PT LONG TERM GOAL #3   Title Patient will be able to ambulate at least 250 feet during 2MWT with LRAD to demonstrate improved ability to perform functional mobility and associated tasks.    Time 6    Period Weeks    Status New    Target Date 06/15/21      PT LONG TERM GOAL #4   Title Patient will be able to maintain semi tandem stance >20 seconds on BLEs to improve stability and reduce risk for falls    Time 6    Period Weeks    Status New    Target Date 06/15/21                 Plan - 06/05/21 1116    Clinical Impression Statement Patient tolerated session well and shows good progress toward therapy goals Increased step height to 6-inch step, patient able to do this with HHA x 1 but with slight compensation, although this was evident on opposite LE also. Patient ambulating well with quad cane but  continues to require cues for increased stride length. Patient will continue to benefit from skilled therapy services to reduce deficits and improve functional ability. Reassess next visit.    Personal Factors and Comorbidities Age    Examination-Activity Limitations Stairs;Locomotion Level;Transfers    Examination-Participation Restrictions Yard Work;Community Activity;Cleaning    Stability/Clinical Decision Making Stable/Uncomplicated    Rehab Potential Good    PT Frequency 2x / week    PT Duration 6 weeks    PT Treatment/Interventions ADLs/Self Care Home Management;Aquatic Therapy;Canalith Repostioning;DME Instruction;Patient/family education;Orthotic Fit/Training;Gait training;Stair training;Functional mobility training;Electrical Stimulation;Cryotherapy;Dry needling;Passive range of motion;Spinal Manipulations;Joint Manipulations;Energy conservation;Taping;Splinting;Vasopneumatic Device;Manual techniques;Vestibular;Therapeutic activities;Iontophoresis 4mg /ml Dexamethasone;Manual lymph drainage;Balance training;Therapeutic exercise;Moist Heat;Traction;Contrast Bath;Fluidtherapy;Neuromuscular re-education;Compression bandaging;Scar mobilization;Visual/perceptual remediation/compensation;Ultrasound;Parrafin;Biofeedback    PT Next Visit Plan Reassess next visit    PT Home Exercise Plan Eval: band PF, ankle circles, seated calf stretch; 05/10/21: towel crunch. 5/16 SLR, standing weight shifting side side/ side and front/ back 5/19 semi tandem stance    Consulted and Agree with Plan of Care Patient           Patient will benefit from skilled therapeutic intervention in order to improve the following deficits and impairments:     Visit Diagnosis: Pain in left ankle and joints of left foot  Other abnormalities of gait and mobility  Difficulty in walking, not elsewhere classified     Problem List Patient Active Problem List   Diagnosis Date Noted  . Uterovaginal prolapse, incomplete  08/06/2013  . LOW BACK PAIN 01/09/2010  . BACK PAIN, CHRONIC 01/09/2010  . 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    12:09 PM, 06/05/21 Josue Hector PT DPT  Physical Therapist with [facility]  [phone]   [facility] [address] [place], [place], [address] Phone: [phone]   Fax:  [phone]  Name: Britiney Blahnik MRN: [id number] Date of Birth: [birth year]

## 2021-06-06 ENCOUNTER — Telehealth: Payer: Self-pay | Admitting: Orthopedic Surgery

## 2021-06-06 ENCOUNTER — Ambulatory Visit (INDEPENDENT_AMBULATORY_CARE_PROVIDER_SITE_OTHER): Payer: 59 | Admitting: Podiatry

## 2021-06-06 DIAGNOSIS — M79675 Pain in left toe(s): Secondary | ICD-10-CM

## 2021-06-06 DIAGNOSIS — B351 Tinea unguium: Secondary | ICD-10-CM

## 2021-06-06 DIAGNOSIS — M79674 Pain in right toe(s): Secondary | ICD-10-CM | POA: Diagnosis not present

## 2021-06-06 NOTE — Telephone Encounter (Signed)
Please advise 

## 2021-06-06 NOTE — Telephone Encounter (Signed)
Patient called to ask if it is okay to soak her left foot, following her surgery on 02/27/21, which she thinks may help with some swelling she is having. Please advise.

## 2021-06-07 ENCOUNTER — Encounter (HOSPITAL_COMMUNITY): Payer: Self-pay | Admitting: Physical Therapy

## 2021-06-07 ENCOUNTER — Ambulatory Visit (HOSPITAL_COMMUNITY): Payer: 59 | Admitting: Physical Therapy

## 2021-06-07 ENCOUNTER — Other Ambulatory Visit: Payer: Self-pay

## 2021-06-07 DIAGNOSIS — R2689 Other abnormalities of gait and mobility: Secondary | ICD-10-CM

## 2021-06-07 DIAGNOSIS — M25572 Pain in left ankle and joints of left foot: Secondary | ICD-10-CM

## 2021-06-07 DIAGNOSIS — R262 Difficulty in walking, not elsewhere classified: Secondary | ICD-10-CM

## 2021-06-07 NOTE — Therapy (Signed)
[facility] [address] [place], [place], [address] Phone: [phone]   Fax:  [phone]  Physical Therapy Treatment  Patient Details  Name: Kim [name] MRN: [id number] Date of Birth: [birth year] Referring Provider (PT): Larena Glassman MD  PHYSICAL THERAPY DISCHARGE SUMMARY  Visits from Start of Care: 10  Current functional level related to goals / functional outcomes: See below    Remaining deficits:  See below   Education / Equipment: See assessment  Plan: Patient agrees to discharge.  Patient goals were partially met. Patient is being discharged due to being pleased with the current functional level.  ?????       Encounter Date: 06/07/2021   PT End of Session - 06/07/21 1055    Visit Number 10    Number of Visits 12    Date for PT Re-Evaluation 06/15/21    Authorization Type UHC Medicare/ Medicaid 2ndary    Progress Note Due on Visit 10    PT Start Time 1045   arrived late   PT Stop Time 1123    PT Time Calculation (min) 38 min    Equipment Utilized During Treatment Gait belt    Activity Tolerance Patient tolerated treatment well    Behavior During Therapy WFL for tasks assessed/performed           Past Medical History:  Diagnosis Date  . Acid reflux   . Arthritis   . Back spasm   . Carpal tunnel syndrome   . Hypertension   . Rheumatoid arthritis [facility])     Past Surgical History:  Procedure Laterality Date  . KNEE SURGERY    . ORIF TOE FRACTURE Left 02/27/2021   Procedure: OPEN REDUCTION INTERNAL FIXATION (ORIF) METATARSAL (TOE) FRACTURE;  Surgeon: Mordecai Rasmussen, MD;  Location: [facility];  Service: Orthopedics;  Laterality: Left;  ORIF of 1st and 2nd metatarsal fractures  . TUBAL LIGATION      There were no vitals filed for this visit.   Subjective Assessment - 06/07/21 1054    Subjective Patient says she feels therapy is going well and can tell her foot is not turned in as bad. She feels her walking has  improved. She says she got her toenails clipped yesterday and this caused her foot to be sore so it is bothering her a little today. No pain stated currently. Patient reports at least 50% improvement since starting therapy.    Pertinent History 1st metatarsal fx of LT foot 02/13/21    Patient Stated Goals Walk better    Currently in Pain? No/denies              [facility] PT Assessment - 06/07/21 0001      Assessment   Medical Diagnosis LT 1st metatarsal fx    Referring Provider (PT) Larena Glassman MD    Onset Date/Surgical Date 02/13/21      Balance Screen   Has the patient fallen in the past 6 months No      [place] residence    Living Arrangements Children      Prior Function   Level of Independence Needs assistance with ADLs      Cognition   Overall Cognitive Status Within Functional Limits for tasks assessed      AROM   Right Ankle Dorsiflexion 6    Left Ankle Dorsiflexion 5      Strength   Left Ankle Dorsiflexion 4+/5   was 3-  Left Ankle Plantar Flexion 4/5   was 3+   Left Ankle Inversion 4+/5   was 3-   Left Ankle Eversion 4+/5   was 3+     Ambulation/Gait   Ambulation/Gait Yes    Ambulation/Gait Assistance 6: Modified independent (Device/Increase time)    Ambulation Distance (Feet) 150 Feet    Assistive device Small based quad cane    Gait Pattern Decreased step length - right;Decreased step length - left;Decreased stride length;Decreased dorsiflexion - left;Trunk flexed    Gait Comments 2MWT      Balance   Balance Assessed Yes      Static Standing Balance   Static Standing Balance -  Activities  Tandam Stance - Right Leg;Tandam Stance - Left Leg    Static Standing - Comment/# of Minutes 30 seconds bilateral in semi tandam                         [facility] Adult PT Treatment/Exercise - 06/07/21 0001      Ankle Exercises: Seated   Ankle Circles/Pumps 20 reps    Other Seated Ankle Exercises ankle inversion x  20 on towel      Ankle Exercises: Stretches   Gastroc Stretch 3 reps;30 seconds    Gastroc Stretch Limitations with strap                    PT Short Term Goals - 06/07/21 1118      PT SHORT TERM GOAL #1   Title Patient will be independent with initial HEP and self-management strategies to improve functional outcomes    Baseline Reports compliance and demos good return    Time 2    Period Weeks    Status Achieved    Target Date 05/18/21             PT Long Term Goals - 06/07/21 1119      PT LONG TERM GOAL #1   Title Patient will have ankle LT AROM at least 5 degrees of DF for improved functional mobility and stair ambulation    Baseline Current 5 degrees    Time 6    Period Weeks    Status Achieved      PT LONG TERM GOAL #2   Title Patient will improve ankle MMT to at least 4/5 throughout for improved functional ability    Baseline See MMT    Time 6    Period Weeks    Status Achieved      PT LONG TERM GOAL #3   Title Patient will be able to ambulate at least 250 feet during 2MWT with LRAD to demonstrate improved ability to perform functional mobility and associated tasks.    Baseline Current 150 feet with quad cane    Time 6    Period Weeks    Status Not Met      PT LONG TERM GOAL #4   Title Patient will be able to maintain semi tandem stance >20 seconds on BLEs to improve stability and reduce risk for falls    Baseline See balance    Time 6    Period Weeks    Status Achieved                 Plan - 06/07/21 1213    Clinical Impression Statement Patient has made good progress and is currently with all but 1 long term therapy goals met. Patient shows improved ROM, strength and balance. Patient  still limited by decreased gait speed, but is now able to ambulate with quad cane with good stability. Discussed progress with patient. Patient states she would like to continue with home exercise to continue with ambulation improvement. Reviewed HEP and  discussed progression to quad cane for ambulation in community. Answered all patient questions. Patient encouraged to follow up with therapy services with any further questions or concerns.    Personal Factors and Comorbidities Age    Examination-Activity Limitations Stairs;Locomotion Level;Transfers    Examination-Participation Restrictions Yard Work;Community Activity;Cleaning    Stability/Clinical Decision Making Stable/Uncomplicated    Rehab Potential Good    PT Treatment/Interventions ADLs/Self Care Home Management;Aquatic Therapy;Canalith Repostioning;DME Instruction;Patient/family education;Orthotic Fit/Training;Gait training;Stair training;Functional mobility training;Electrical Stimulation;Cryotherapy;Dry needling;Passive range of motion;Spinal Manipulations;Joint Manipulations;Energy conservation;Taping;Splinting;Vasopneumatic Device;Manual techniques;Vestibular;Therapeutic activities;Iontophoresis 22m/ml Dexamethasone;Manual lymph drainage;Balance training;Therapeutic exercise;Moist Heat;Traction;Contrast Bath;Fluidtherapy;Neuromuscular re-education;Compression bandaging;Scar mobilization;Visual/perceptual remediation/compensation;Ultrasound;Parrafin;Biofeedback    PT Next Visit Plan DC to HEP    PT Home Exercise Plan Eval: band PF, ankle circles, seated calf stretch; 05/10/21: towel crunch. 5/16 SLR, standing weight shifting side side/ side and front/ back 5/19 semi tandem stance    Consulted and Agree with Plan of Care Patient           Patient will benefit from skilled therapeutic intervention in order to improve the following deficits and impairments:     Visit Diagnosis: Pain in left ankle and joints of left foot  Other abnormalities of gait and mobility  Difficulty in walking, not elsewhere classified     Problem List Patient Active Problem List   Diagnosis Date Noted  . Uterovaginal prolapse, incomplete 08/06/2013  . LOW BACK PAIN 01/09/2010  . BACK PAIN, CHRONIC  01/09/2010  . 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

## 2021-06-07 NOTE — Telephone Encounter (Signed)
Spoke with pt and gave her information from Dr. Amedeo Kinsman. States she's not having any problems with incisions and she will try the soaks.

## 2021-06-11 ENCOUNTER — Encounter: Payer: Self-pay | Admitting: Podiatry

## 2021-06-11 NOTE — Progress Notes (Signed)
  Subjective:  Patient ID: Kim [name], female    DOB: 11-19-1941,  MRN: [id number]  Chief Complaint  Patient presents with   foot care     Routine foot care     80 y.o. female presents with the above complaint. History confirmed with patient.  Nails are thickened elongated and painful and difficult to cut  Objective:  Physical Exam: warm, good capillary refill, no trophic changes or ulcerative lesions, normal DP and PT pulses, normal sensory exam, onychomycosis with thickened elongated painful yellowed nails with subungual debris.  Assessment:   1. Pain due to onychomycosis of toenails of both feet      Plan:  Patient was evaluated and treated and all questions answered.  Discussed the etiology and treatment options for the condition in detail with the patient. Educated patient on the topical and oral treatment options for mycotic nails. Recommended debridement of the nails today. Sharp and mechanical debridement performed of all painful and mycotic nails today. Nails debrided in length and thickness using a nail nipper to level of comfort. Discussed treatment options including appropriate shoe gear. Follow up as needed for painful nails.    Return in about 3 months (around 09/06/2021) for RFC.

## 2021-06-26 ENCOUNTER — Ambulatory Visit: Payer: 59 | Admitting: Obstetrics & Gynecology

## 2021-07-12 ENCOUNTER — Ambulatory Visit (INDEPENDENT_AMBULATORY_CARE_PROVIDER_SITE_OTHER): Payer: 59 | Admitting: Orthopedic Surgery

## 2021-07-12 ENCOUNTER — Ambulatory Visit: Payer: 59

## 2021-07-12 ENCOUNTER — Other Ambulatory Visit: Payer: Self-pay

## 2021-07-12 ENCOUNTER — Encounter: Payer: Self-pay | Admitting: Orthopedic Surgery

## 2021-07-12 VITALS — Ht 64.0 in | Wt 189.0 lb

## 2021-07-12 DIAGNOSIS — S92315D Nondisplaced fracture of first metatarsal bone, left foot, subsequent encounter for fracture with routine healing: Secondary | ICD-10-CM

## 2021-07-12 DIAGNOSIS — S92345D Nondisplaced fracture of fourth metatarsal bone, left foot, subsequent encounter for fracture with routine healing: Secondary | ICD-10-CM

## 2021-07-12 DIAGNOSIS — S92322D Displaced fracture of second metatarsal bone, left foot, subsequent encounter for fracture with routine healing: Secondary | ICD-10-CM

## 2021-07-12 DIAGNOSIS — S92355D Nondisplaced fracture of fifth metatarsal bone, left foot, subsequent encounter for fracture with routine healing: Secondary | ICD-10-CM

## 2021-07-12 DIAGNOSIS — S92335D Nondisplaced fracture of third metatarsal bone, left foot, subsequent encounter for fracture with routine healing: Secondary | ICD-10-CM

## 2021-07-12 NOTE — Progress Notes (Addendum)
Orthopaedic Postop Note  Assessment: Kim [name] is a 80 y.o. female s/p ORIF of L 1st and 2nd metatarsal fracture; nonoperative management of L 3rd, 4th and 5th metatarsal shaft fractures Left second toe hammer toe and ingrown toe nail.   DOS: 02/27/2021  Plan: Pleased with the progress of her fractures.  Repeat radiographs demonstrate maintenance of alignment of all fractures.  She is walking well with a walker.  She has occasional pains of the dorsum of her foot that could be secondary to the hardware.  More concerning to her at this time is pain at the distal aspect of the 2nd toe.  She has developed a callus at the tip of the 2nd toe, consistent with a hammer toe.  She also has an ingrown toe nail and associated deformity.  She is interested in seeing a podiatrist, a referral was placed today.  We briefly discussed the possibility of removing the hardware, if this continues to bother her.  We will plan to see her back in approximately the 1 year anniversary of surgery for a follow-up appointment.  We can discuss hardware removal in more detail at that time.  Follow-up: Return in about 8 months (around 02/27/2022). XR at next visit: L foot  Subjective:  Chief Complaint  Patient presents with   Fracture    Lt foot s/p  ORIF 1st and 2nd metatarsal fx 02/27/21    History of Present Illness: Kim [name] is a 80 y.o. female who presents following the above stated procedure.  Surgery was 4+ months ago, and she is doing well.  She is ambulating well with the assistance of a walker.  She does continue to have some pain in the second toe.  She is unable to wear a regular shoe, but is able to wear crocs comfortably.  She is not taking any medications.  She has occasional sharp pains in the dorsum of her foot.  She has been applying an antibiotic cream to her left second toe.  No issues with the surgical incisions.  Review of Systems: No fevers or chills No numbness or tingling No Chest  Pain No shortness of breath +burning medial foot   Objective: Ht 5\' 4"  (1.626 m)   Wt 189 lb (85.7 kg)   BMI 32.44 kg/m   Physical Exam:  Elderly female Alert and oriented, no acute distress. Ambulates slowly, but steady with the assistance of a walker.      Evaluation left foot demonstrates no residual swelling.  No ecchymosis is appreciated.  She has a flatfoot deformity.  Tenderness to palpation between the first and the second metatarsal.  The hardware is palpable over the first metatarsal.  Sensation intact over the dorsum of the foot, as well as the plantar foot.  Left second toe with a callus forming of the distal tip.  This is very tender to palpation.  She has an ingrown toenail of the left second toe.  No swelling or drainage.  No erythema.  IMAGING: I personally ordered and reviewed the following images:  X-rays of the left foot were obtained in clinic today and demonstrates no acute injuries.  The third, fourth and fifth metatarsal fractures are well-healed, in good alignment.  There is been no interval displacement of the fracture of the first and second metatarsals, with hardware in good position.  No subsidence or failure of the hardware.  Impression: Healing metatarsal fractures following operative fixation.  Mordecai Rasmussen, MD 07/12/2021 11:12 AM

## 2021-07-13 ENCOUNTER — Other Ambulatory Visit (HOSPITAL_COMMUNITY): Payer: Self-pay | Admitting: Family Medicine

## 2021-07-13 DIAGNOSIS — Z1231 Encounter for screening mammogram for malignant neoplasm of breast: Secondary | ICD-10-CM

## 2021-07-17 ENCOUNTER — Other Ambulatory Visit: Payer: Self-pay

## 2021-07-17 ENCOUNTER — Ambulatory Visit (HOSPITAL_COMMUNITY)
Admission: RE | Admit: 2021-07-17 | Discharge: 2021-07-17 | Disposition: A | Discharge status: Home / Self Care | Payer: 59 | Source: Ambulatory Visit | Attending: Family Medicine | Admitting: Family Medicine

## 2021-07-17 DIAGNOSIS — Z1231 Encounter for screening mammogram for malignant neoplasm of breast: Secondary | ICD-10-CM | POA: Diagnosis present

## 2021-08-29 ENCOUNTER — Encounter (HOSPITAL_COMMUNITY): Payer: Self-pay | Admitting: *Deleted

## 2021-08-29 ENCOUNTER — Emergency Department (HOSPITAL_COMMUNITY)
Admission: EM | Admit: 2021-08-29 | Discharge: 2021-08-29 | Disposition: A | Discharge status: Home / Self Care | Payer: 59 | Attending: Emergency Medicine | Admitting: Emergency Medicine

## 2021-08-29 ENCOUNTER — Other Ambulatory Visit: Payer: Self-pay

## 2021-08-29 ENCOUNTER — Emergency Department (HOSPITAL_COMMUNITY): Payer: 59

## 2021-08-29 DIAGNOSIS — R519 Headache, unspecified: Secondary | ICD-10-CM | POA: Diagnosis present

## 2021-08-29 DIAGNOSIS — E876 Hypokalemia: Secondary | ICD-10-CM | POA: Insufficient documentation

## 2021-08-29 DIAGNOSIS — U071 COVID-19: Secondary | ICD-10-CM | POA: Diagnosis not present

## 2021-08-29 DIAGNOSIS — I10 Essential (primary) hypertension: Secondary | ICD-10-CM | POA: Insufficient documentation

## 2021-08-29 LAB — CBC WITH DIFFERENTIAL/PLATELET
Abs Immature Granulocytes: 0.01 10*3/uL (ref 0.00–0.07)
Basophils Absolute: 0 10*3/uL (ref 0.0–0.1)
Basophils Relative: 1 %
Eosinophils Absolute: 0 10*3/uL (ref 0.0–0.5)
Eosinophils Relative: 0 %
HCT: 45.2 % (ref 36.0–46.0)
Hemoglobin: 14.6 g/dL (ref 12.0–15.0)
Immature Granulocytes: 0 %
Lymphocytes Relative: 45 %
Lymphs Abs: 1.5 10*3/uL (ref 0.7–4.0)
MCH: 28.9 pg (ref 26.0–34.0)
MCHC: 32.3 g/dL (ref 30.0–36.0)
MCV: 89.3 fL (ref 80.0–100.0)
Monocytes Absolute: 0.5 10*3/uL (ref 0.1–1.0)
Monocytes Relative: 14 %
Neutro Abs: 1.3 10*3/uL — ABNORMAL LOW (ref 1.7–7.7)
Neutrophils Relative %: 40 %
Platelets: 162 10*3/uL (ref 150–400)
RBC: 5.06 MIL/uL (ref 3.87–5.11)
RDW: 14.4 % (ref 11.5–15.5)
WBC: 3.3 10*3/uL — ABNORMAL LOW (ref 4.0–10.5)
nRBC: 0 % (ref 0.0–0.2)

## 2021-08-29 LAB — BASIC METABOLIC PANEL
Anion gap: 7 (ref 5–15)
BUN: 20 mg/dL (ref 8–23)
CO2: 26 mmol/L (ref 22–32)
Calcium: 8.5 mg/dL — ABNORMAL LOW (ref 8.9–10.3)
Chloride: 100 mmol/L (ref 98–111)
Creatinine, Ser: 0.98 mg/dL (ref 0.44–1.00)
GFR, Estimated: 59 mL/min — ABNORMAL LOW (ref >60)
Glucose, Bld: 92 mg/dL (ref 70–99)
Potassium: 3.1 mmol/L — ABNORMAL LOW (ref 3.5–5.1)
Sodium: 133 mmol/L — ABNORMAL LOW (ref 135–145)

## 2021-08-29 LAB — RESP PANEL BY RT-PCR (FLU A&B, COVID) ARPGX2
Influenza A by PCR: NEGATIVE
Influenza B by PCR: NEGATIVE
SARS Coronavirus 2 by RT PCR: POSITIVE — AB

## 2021-08-29 MED ORDER — NIRMATRELVIR/RITONAVIR (PAXLOVID) TABLET (RENAL DOSING): 2.0000 | ORAL_TABLET | Freq: Two times a day (BID) | ORAL | 0 refills | Status: AC

## 2021-08-29 NOTE — ED Triage Notes (Signed)
Body aches, headaches and chills onset 3 days ago

## 2021-08-29 NOTE — ED Provider Notes (Signed)
[facility] Provider Note   CSN: [id number] Arrival date & time: 08/29/21  1103     History No chief complaint on file.   Kim [name] is a 80 y.o. female with a history of hypertension, rheumatoid arthritis, GERD.  Presents to the emergency department with a chief complaint of generalized body aches, headache, and chills.  Patient reports that her symptoms started Saturday evening.  Patient also reports a productive cough and "just a little bit of shortness of breath."  Patient denies checking her temperature but states she does not feel like she has been running a fever.  Patient denies any known sick contacts.  Patient received vaccination for COVID-19 however has not received boosters.  Patient denies any rhinorrhea, nasal congestion, sore throat, chest pain, leg swelling, palpitations, abdominal pain, nausea, vomiting, diarrhea, dysuria, hematuria, urinary frequency, syncope, lightheadedness, numbness, weakness.  Denies any history of DVT/PE, hormone therapy, surgery last 4 weeks, cancer treatment, hemoptysis.  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    Past Surgical History:  Procedure Laterality Date   KNEE SURGERY     ORIF TOE FRACTURE Left 02/27/2021   Procedure: OPEN REDUCTION INTERNAL FIXATION (ORIF) METATARSAL (TOE) FRACTURE;  Surgeon: Mordecai Rasmussen, MD;  Location: [facility];  Service: Orthopedics;  Laterality: Left;  ORIF of 1st and 2nd metatarsal fractures   TUBAL LIGATION       OB History     Gravida  8   Para  8   Term  8   Preterm      AB      Living  8      SAB      IAB       Ectopic      Multiple      Live Births  8           Family History  Problem Relation Age of Onset   Heart failure Mother    Stroke Mother    Heart failure Father    Stroke Father    Hypertension Sister     Social History   Tobacco Use   Smoking status: Never   Smokeless tobacco: Never  Vaping Use   Vaping Use: Never used  Substance Use Topics   Alcohol use: No   Drug use: No    Home Medications Prior to Admission medications   Medication Sig Start Date End Date Taking? Authorizing Provider  albuterol (PROVENTIL HFA;VENTOLIN HFA) 108 (90 Base) MCG/ACT inhaler Inhale 1-2 puffs into the lungs every 6 (six) hours as needed for wheezing or shortness of breath. 03/05/18   Long, Wonda Olds, MD  ASPERCREME LIDOCAINE EX Apply 1 application topically in the morning and at bedtime. Knee pain. Patient not taking: No sig reported    [provider]  Cholecalciferol (VITAMIN D3) 50 MCG (2000 UT) capsule Take 2,000 Units by mouth daily.    [provider]  ondansetron (ZOFRAN ODT) 4 MG disintegrating tablet Take 1 tablet (4 mg total) by mouth every 8 (eight) hours as needed for nausea or vomiting.  Patient not taking: No sig reported 01/09/21   Avegno, Darrelyn Hillock, FNP  oxyCODONE (ROXICODONE) 5 MG immediate release tablet Take 1 tablet (5 mg total) by mouth every 6 (six) hours as needed for severe pain. Patient not taking: Reported on 07/12/2021 03/31/21   Mordecai Rasmussen, MD  potassium chloride (MICRO-K) 10 MEQ CR capsule Take 10 mEq by mouth daily. 08/29/20   [provider]  triamterene-hydrochlorothiazide (MAXZIDE-25) 37.5-25 MG tablet Take 1 tablet by mouth daily.  01/09/17   [provider]    Allergies    Tramadol  Review of Systems   Review of Systems  Constitutional:  Positive for chills. Negative for fever.  Eyes:  Negative for visual disturbance.  Respiratory:  Positive for cough and shortness of breath.   Cardiovascular:  Negative for chest  pain, palpitations and leg swelling.  Gastrointestinal:  Negative for abdominal pain, diarrhea, nausea and vomiting.  Genitourinary:  Negative for difficulty urinating, dysuria, frequency, hematuria and urgency.  Musculoskeletal:  Positive for myalgias. Negative for back pain and neck pain.  Skin:  Negative for color change and rash.  Neurological:  Positive for headaches. Negative for dizziness, tremors, seizures, syncope, facial asymmetry, speech difficulty, weakness, light-headedness and numbness.  Psychiatric/Behavioral:  Negative for confusion.    Physical Exam Updated Vital Signs BP 125/74 (BP Location: Left Arm)   Pulse (!) 101   Temp 98.9 F (37.2 C) (Oral)   Resp 16   SpO2 96%   Physical Exam Vitals and nursing note reviewed.  Constitutional:      General: She is not in acute distress.    Appearance: She is not ill-appearing, toxic-appearing or diaphoretic.  HENT:     Head: Normocephalic.  Eyes:     General: No scleral icterus.       Right eye: No discharge.        Left eye: No discharge.  Cardiovascular:     Rate and Rhythm: Normal rate.  Pulmonary:     Effort: Pulmonary effort is normal. No tachypnea, bradypnea or respiratory distress.     Breath sounds: Normal breath sounds. No stridor.  Abdominal:     General: There is no distension. There are no signs of injury.     Palpations: There is no mass or pulsatile mass.     Tenderness: There is no abdominal tenderness. There is no guarding or rebound.  Musculoskeletal:     Right lower leg: Normal.     Left lower leg: Normal.  Skin:    General: Skin is warm and dry.  Neurological:     General: No focal deficit present.     Mental Status: She is alert.     GCS: GCS eye subscore is 4. GCS verbal subscore is 5. GCS motor subscore is 6.  Psychiatric:        Behavior: Behavior is cooperative.    ED Results / Procedures / Treatments   Labs (all labs ordered are listed, but only abnormal results are displayed) Labs  Reviewed  RESP PANEL BY RT-PCR (FLU A&B, COVID) ARPGX2 - Abnormal; Notable for the following components:      Result Value   SARS Coronavirus 2 by RT PCR POSITIVE (*)    All other components within normal limits  CBC WITH DIFFERENTIAL/PLATELET - Abnormal; Notable for the following components:   WBC 3.3 (*)    Neutro Abs 1.3 (*)    All other components within normal limits  BASIC METABOLIC PANEL - Abnormal; Notable for the  following components:   Sodium 133 (*)    Potassium 3.1 (*)    Calcium 8.5 (*)    GFR, Estimated 59 (*)    All other components within normal limits    EKG None  Radiology DG Chest Portable 1 View  Result Date: 08/29/2021 CLINICAL DATA:  Cough and body aches EXAM: PORTABLE CHEST 1 VIEW COMPARISON:  Chest x-ray dated June 03, 2020 FINDINGS: Heart is upper limits of normal in size. Unchanged tortuosity of the thoracic aorta. Eventration of the bilateral diaphragms. Bibasilar atelectasis. Lungs otherwise clear. No large pleural effusion or evidence of pneumothorax. IMPRESSION: No active disease. Electronically Signed   By: Yetta Glassman M.D.   On: 08/29/2021 13:39    Procedures Procedures   Medications Ordered in ED Medications - No data to display  ED Course  I have reviewed the triage vital signs and the nursing notes.  Pertinent labs & imaging results that were available during my care of the patient were reviewed by me and considered in my medical decision making (see chart for details).    MDM Rules/Calculators/A&P                           Alert 80 year old female no acute stress, nontoxic-appearing.  Presents to ED with chief complaint of headaches, generalized myalgia, chills, nonproductive cough.  Symptoms x4 days.  Patient has been vaccinated for COVID-19, has not received any booster.  Patient denies any known sick contacts.  Patient was tested for COVID-19 and influenza.  Patient reports that if positive for COVID-19 she would like to receive  antiviral medication.  Will obtain basic lab work and chest x-ray.  Patient was positive for COVID-19.  GFR 59.  Spoke with ED pharmacist Remo Lipps who reviewed patient's medications and so no interactions with Paxil of it.  Will prescribe patient with renal dose of Paxlovid.    Patient's potassium was found to be decreased at 3.1.  Patient has prescription for 10 mEq oral potassium daily.  Patient advised to continue taking this medication and follow-up with her primary care provider in 1 week to have labs reassessed.  Discussed results, findings, treatment and follow up. Patient advised of return precautions. Patient verbalized understanding and agreed with plan.  Patient care and treatment were discussed with attending physician Dr. Vanita Panda.  Kim [name] was evaluated in Emergency Department on 08/29/2021 for the symptoms described in the history of present illness. She was evaluated in the context of the global COVID-19 pandemic, which necessitated consideration that the patient might be at risk for infection with the SARS-CoV-2 virus that causes COVID-19. Institutional protocols and algorithms that pertain to the evaluation of patients at risk for COVID-19 are in a state of rapid change based on information released by regulatory bodies including the CDC and federal and state organizations. These policies and algorithms were followed during the patient's care in the ED.   Final Clinical Impression(s) / ED Diagnoses Final diagnoses:  [id number]    Rx / DC Orders ED Discharge Orders          Ordered    nirmatrelvir/ritonavir EUA, renal dosing, (PAXLOVID) 10 x 150 MG & 10 x '100MG'$  TABS  2 times daily        08/29/21 [address] 08/29/21 1435    Carmin Muskrat, MD 08/30/21 [phone]

## 2021-08-29 NOTE — Discharge Instructions (Addendum)
You came to the emergency department today with reports of Covid-19 like symptoms.   You tested positive for COVID-19. Please isolate at home for at least 7 days after the day your symptoms initially began, and THEN at least 24 hours after you are fever-free without the help of medications (Tylenol/acetaminophen and Advil/ibuprofen/Motrin) AND your symptoms are improving.  You can alternate Tylenol/acetaminophen and Advil/ibuprofen/Motrin every 4 hours for sore throat, body aches, headache or fever.  Drink plenty of water.  Use saline nasal spray for congestion. Wash your hands frequently. Please rest as needed with frequent repositioning and ambulation as tolerated.    If you use a CPAP or BiPAP device for management of obstructive sleep apnea may continue to use it however use it when isolated from other individuals to avoid spread of COVID-19.   If you use a nebulizer administer medication such as albuterol you may continue to use it however only one isolated from other individuals to avoid the spread of COVID-19.  Your lab work also revealed that your potassium was slightly low at 3.1.  Please take your prescribed potassium and follow-up with your primary care provider within the next week to have these levels rechecked.  If your symptoms do not improve please follow-up with your primary care provider or urgent care.  Return to the ER for significant shortness of breath, uncontrollable vomiting, severe chest pain, inability to tolerate fluids, changes in mental status such as confusion or other concerning symptoms.

## 2021-08-29 NOTE — ED Notes (Signed)
Date and time results received: 08/29/21 1354 (use smartphrase ".now" to insert current time)  Test: covid Critical Value: positive  Name of Provider Notified: peter PA  Orders Received? Or Actions Taken?: no/na

## 2021-09-12 ENCOUNTER — Ambulatory Visit (INDEPENDENT_AMBULATORY_CARE_PROVIDER_SITE_OTHER): Payer: 59 | Admitting: Podiatry

## 2021-09-12 DIAGNOSIS — B351 Tinea unguium: Secondary | ICD-10-CM

## 2021-09-12 DIAGNOSIS — M79674 Pain in right toe(s): Secondary | ICD-10-CM

## 2021-09-12 DIAGNOSIS — M79675 Pain in left toe(s): Secondary | ICD-10-CM

## 2021-09-12 NOTE — Progress Notes (Signed)
Patient no-show for appointment. Was diagnosed with COVID 8/30 per chart review. No charge.

## 2021-09-20 ENCOUNTER — Ambulatory Visit (INDEPENDENT_AMBULATORY_CARE_PROVIDER_SITE_OTHER): Payer: 59 | Admitting: Podiatry

## 2021-09-20 ENCOUNTER — Other Ambulatory Visit: Payer: Self-pay

## 2021-09-20 ENCOUNTER — Encounter: Payer: Self-pay | Admitting: Podiatry

## 2021-09-20 DIAGNOSIS — M79674 Pain in right toe(s): Secondary | ICD-10-CM | POA: Diagnosis not present

## 2021-09-20 DIAGNOSIS — L84 Corns and callosities: Secondary | ICD-10-CM

## 2021-09-20 DIAGNOSIS — M79675 Pain in left toe(s): Secondary | ICD-10-CM

## 2021-09-20 DIAGNOSIS — B351 Tinea unguium: Secondary | ICD-10-CM | POA: Diagnosis not present

## 2021-09-20 NOTE — Progress Notes (Signed)
This patient returns to the office for evaluation and treatment of long thick painful nails .  This patient is unable to trim her own nails since the patient cannot reach her feet.  Patient says the nails are painful walking and wearing his shoes. Patient also says she has painful corns on the tips of two toes which are painful when walking. He returns for preventive foot care services.  General Appearance  Alert, conversant and in no acute stress.  Vascular  Dorsalis pedis and posterior tibial  pulses are palpable  bilaterally.  Capillary return is within normal limits  bilaterally. Temperature is within normal limits  bilaterally.  Neurologic  Senn-Weinstein monofilament wire test within normal limits  bilaterally. Muscle power within normal limits bilaterally.  Nails Thick disfigured discolored nails with subungual debris  from hallux to fifth toes bilaterally. No evidence of bacterial infection or drainage bilaterally.  Orthopedic  No limitations of motion  feet .  No crepitus or effusions noted.  No bony pathology or digital deformities noted. HAV  Right foot.  2-3 swelling left foot.  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  Skin  normotropic skin with no porokeratosis noted bilaterally.  No signs of infections or ulcers noted.   Clavi noted 3rd toe  both feet.  Onychomycosis  Pain in toes right foot  Pain in toes left foot  Debridement  of nails  1-5  B/L with a nail nipper.  Nails were then filed using a dremel tool with no incidents.  Debride clavi 3rd toes  B/L.  Padding dispensed for painful toes.  RTC  3 months    Gardiner Barefoot DPM

## 2021-09-22 ENCOUNTER — Other Ambulatory Visit: Payer: Self-pay

## 2021-09-22 ENCOUNTER — Ambulatory Visit
Admission: EM | Admit: 2021-09-22 | Discharge: 2021-09-22 | Disposition: A | Discharge status: Home / Self Care | Payer: 59 | Attending: Emergency Medicine | Admitting: Emergency Medicine

## 2021-09-22 DIAGNOSIS — R21 Rash and other nonspecific skin eruption: Secondary | ICD-10-CM | POA: Diagnosis not present

## 2021-09-22 MED ORDER — PREDNISONE 20 MG PO TABS: 20.0000 mg | ORAL_TABLET | Freq: Two times a day (BID) | ORAL | 0 refills | Status: AC

## 2021-09-22 NOTE — Discharge Instructions (Signed)
Switch back to previous detergent or one that is scent free without preservatives in case you are having some type of allergic reaction Prescribed steroid Continue with antibiotic Follow up with PCP or dermatologist next week for recheck Return or go to the ER if you have any new or worsening symptoms such as fever, chills, nausea, vomiting, redness, swelling, discharge, if symptoms do not improve with medications, etc..Marland Kitchen

## 2021-09-22 NOTE — ED Triage Notes (Signed)
Pt presents with rash on chin that burns and itches that began on Monday

## 2021-09-22 NOTE — ED Provider Notes (Signed)
[place]   [id number] 09/22/21 Arrival Time: 19  CC: Rash   SUBJECTIVE:  Kim [name] is a 80 y.o. female who presents with a rash on chin that began 5 days ago.  Admits to changes in detergent, and has noticed some rash to neck and chest.  Seen by dermatologist and prescribed antibiotic, but denies relief.  Describes it as red and itchy, not painful, draining or warm to the touch.  Denies aggravating factors.  Denies similar symptoms in the past.   Denies fever, chills, nausea, vomiting, discharge, oral lesions, SOB, chest pain, dysphagia, dyspnea.    ROS: As per HPI.  All other pertinent ROS negative.     Past Medical History:  Diagnosis Date   Acid reflux    Arthritis    Back spasm    Carpal tunnel syndrome    Hypertension    Rheumatoid arthritis [facility])    Past Surgical History:  Procedure Laterality Date   KNEE SURGERY     ORIF TOE FRACTURE Left 02/27/2021   Procedure: OPEN REDUCTION INTERNAL FIXATION (ORIF) METATARSAL (TOE) FRACTURE;  Surgeon: Mordecai Rasmussen, MD;  Location: [facility];  Service: Orthopedics;  Laterality: Left;  ORIF of 1st and 2nd metatarsal fractures   TUBAL LIGATION     Allergies  Allergen Reactions   Tramadol Hives and Nausea And Vomiting   No current facility-administered medications on file prior to encounter.   Current Outpatient Medications on File Prior to Encounter  Medication Sig Dispense Refill   albuterol (PROVENTIL HFA;VENTOLIN HFA) 108 (90 Base) MCG/ACT inhaler Inhale 1-2 puffs into the lungs every 6 (six) hours as needed for wheezing or shortness of breath. 1 Inhaler 0   ASPERCREME LIDOCAINE EX Apply 1 application topically in the morning and at bedtime. Knee pain. (Patient not taking: No sig reported)     Cholecalciferol (VITAMIN D3) 50 MCG (2000 UT) capsule Take 2,000 Units by mouth daily.     ondansetron (ZOFRAN ODT) 4 MG disintegrating tablet Take 1 tablet (4 mg total) by mouth every 8 (eight) hours as needed for nausea or  vomiting. (Patient not taking: No sig reported) 20 tablet 0   oxyCODONE (ROXICODONE) 5 MG immediate release tablet Take 1 tablet (5 mg total) by mouth every 6 (six) hours as needed for severe pain. (Patient not taking: Reported on 07/12/2021) 15 tablet 0   potassium chloride (MICRO-K) 10 MEQ CR capsule Take 10 mEq by mouth daily.     triamterene-hydrochlorothiazide (MAXZIDE-25) 37.5-25 MG tablet Take 1 tablet by mouth daily.      Social History   Socioeconomic History   Marital status: Divorced    Spouse name: Not on file   Number of children: 8   Years of education: Not on file   Highest education level: Not on file  Occupational History   Not on file  Tobacco Use   Smoking status: Never   Smokeless tobacco: Never  Vaping Use   Vaping Use: Never used  Substance and Sexual Activity   Alcohol use: No   Drug use: No   Sexual activity: Not Currently    Birth control/protection: Surgical    Comment: tubal  Other Topics Concern   Not on file  Social History Narrative   Not on file   Social Determinants of Health   Financial Resource Strain: Not on file  Food Insecurity: Not on file  Transportation Needs: Not on file  Physical Activity: Not on file  Stress: Not on file  Social  Connections: Not on file  Intimate Partner Violence: Not on file   Family History  Problem Relation Age of Onset   Heart failure Mother    Stroke Mother    Heart failure Father    Stroke Father    Hypertension Sister     OBJECTIVE: Vitals:   09/22/21 1032  BP: (!) 155/91  Pulse: (!) 111  Resp: 20  Temp: 98.3 F (36.8 C)  SpO2: 98%    General appearance: alert; no distress Head: NCAT ENT: EOMI grossly; nares patent; speaking in full sentences and tolerating own secretions Lungs: normal respiratory effort Extremities: no edema Skin: warm and dry; erythema and swelling localized to chin, some papules, but no obvious pustules or drainage, NTTP Psychological: alert and cooperative; normal  mood and affect  ASSESSMENT & PLAN:  1. Rash of face     Meds ordered this encounter  Medications   predniSONE (DELTASONE) 20 MG tablet    Sig: Take 1 tablet (20 mg total) by mouth 2 (two) times daily with a meal for 5 days.    Dispense:  10 tablet    Refill:  0    Order Specific Question:   Supervising Provider    Answer:   Raylene Everts [[id number]]    @[facility] @  Switch back to previous detergent or one that is scent free without preservatives in case you are having some type of allergic reaction Prescribed steroid Continue with antibiotic Follow up with PCP or dermatologist next week for recheck Return or go to the ER if you have any new or worsening symptoms such as fever, chills, nausea, vomiting, redness, swelling, discharge, if symptoms do not improve with medications, etc...  Reviewed expectations re: course of current medical issues. Questions answered. Outlined signs and symptoms indicating need for more acute intervention. Patient verbalized understanding. After Visit Summary given.    Lestine Box, PA-C 09/22/21 1050

## 2021-10-20 ENCOUNTER — Telehealth: Payer: Self-pay | Admitting: Orthopedic Surgery

## 2021-10-20 NOTE — Telephone Encounter (Signed)
Patient called to inquire about scheduling for a problem with feet namely toenails. Relayed per chart notes that since she is treating with podiatry for this issue, and that we do not treat this here at our clinic, to contact her podiatry office to be further advised.

## 2021-10-27 ENCOUNTER — Other Ambulatory Visit: Payer: Self-pay

## 2021-10-27 ENCOUNTER — Encounter: Payer: Self-pay | Admitting: Emergency Medicine

## 2021-10-27 ENCOUNTER — Ambulatory Visit
Admission: EM | Admit: 2021-10-27 | Discharge: 2021-10-27 | Disposition: A | Discharge status: Home / Self Care | Payer: 59 | Attending: Urgent Care | Admitting: Urgent Care

## 2021-10-27 DIAGNOSIS — L299 Pruritus, unspecified: Secondary | ICD-10-CM

## 2021-10-27 DIAGNOSIS — R21 Rash and other nonspecific skin eruption: Secondary | ICD-10-CM | POA: Diagnosis not present

## 2021-10-27 MED ORDER — PIMECROLIMUS 1 % EX CREA: TOPICAL_CREAM | CUTANEOUS | 0 refills | Status: DC

## 2021-10-27 MED ORDER — TRIAMCINOLONE ACETONIDE 0.1 % EX CREA: 1.0000 "application " | TOPICAL_CREAM | Freq: Two times a day (BID) | CUTANEOUS | 0 refills | Status: DC

## 2021-10-27 NOTE — ED Triage Notes (Signed)
Patient c/o rash and facial swelling x 2 days.   Patient endorses rash present on face, neck, and chest.   Patient endorses itching.   Patient endorses previous episode with similar symptoms.  Patient has seen the Dermatologist previously.  Patient has used an OTC "cream" with no relief of symptoms.

## 2021-10-27 NOTE — ED Provider Notes (Signed)
[place]   MRN: [id number] DOB: [birth year]  Subjective:   Kim [name] is a 80 y.o. female presenting for recurrent pruritic rash over her face, upper chest and neck.  Was previously seen for this before, responded some to a steroid course.  She went and saw a dermatologist and was given an antibiotic which did not help her.  Has not gone back to that same dermatologist.  Has had her home checked for bed bugs. Denies eating any new foods, starting new medications, exposure to poisonous plants, new hygiene products, new cleaning products or detergents.  No oral swelling, tenderness, difficulty with her breathing.  No current facility-administered medications for this encounter.  Current Outpatient Medications:    Cholecalciferol (VITAMIN D3) 50 MCG (2000 UT) capsule, Take 2,000 Units by mouth daily., Disp: , Rfl:    triamterene-hydrochlorothiazide (MAXZIDE-25) 37.5-25 MG tablet, Take 1 tablet by mouth daily. , Disp: , Rfl:    albuterol (PROVENTIL HFA;VENTOLIN HFA) 108 (90 Base) MCG/ACT inhaler, Inhale 1-2 puffs into the lungs every 6 (six) hours as needed for wheezing or shortness of breath., Disp: 1 Inhaler, Rfl: 0   ASPERCREME LIDOCAINE EX, Apply 1 application topically in the morning and at bedtime. Knee pain. (Patient not taking: No sig reported), Disp: , Rfl:    ondansetron (ZOFRAN ODT) 4 MG disintegrating tablet, Take 1 tablet (4 mg total) by mouth every 8 (eight) hours as needed for nausea or vomiting. (Patient not taking: No sig reported), Disp: 20 tablet, Rfl: 0   oxyCODONE (ROXICODONE) 5 MG immediate release tablet, Take 1 tablet (5 mg total) by mouth every 6 (six) hours as needed for severe pain. (Patient not taking: No sig reported), Disp: 15 tablet, Rfl: 0   potassium chloride (MICRO-K) 10 MEQ CR capsule, Take 10 mEq by mouth daily., Disp: , Rfl:    Allergies  Allergen Reactions   Tramadol Hives and Nausea And Vomiting    Past Medical History:  Diagnosis  Date   Acid reflux    Arthritis    Back spasm    Carpal tunnel syndrome    Hypertension    Rheumatoid arthritis ([place])      Past Surgical History:  Procedure Laterality Date   KNEE SURGERY     ORIF TOE FRACTURE Left 02/27/2021   Procedure: OPEN REDUCTION INTERNAL FIXATION (ORIF) METATARSAL (TOE) FRACTURE;  Surgeon: Mordecai Rasmussen, MD;  Location: [facility];  Service: Orthopedics;  Laterality: Left;  ORIF of 1st and 2nd metatarsal fractures   TUBAL LIGATION      Family History  Problem Relation Age of Onset   Heart failure Mother    Stroke Mother    Heart failure Father    Stroke Father    Hypertension Sister     Social History   Tobacco Use   Smoking status: Never   Smokeless tobacco: Never  Vaping Use   Vaping Use: Never used  Substance Use Topics   Alcohol use: No   Drug use: No    ROS   Objective:   Vitals: BP (!) 144/80 (BP Location: Right Arm)   Pulse 90   Temp 98.2 F (36.8 C) (Oral)   Resp 18   SpO2 96%   Physical Exam Constitutional:      General: She is not in acute distress.    Appearance: Normal appearance. She is well-developed. She is not ill-appearing, toxic-appearing or diaphoretic.  HENT:     Head: Normocephalic and atraumatic.     Right Ear:  Tympanic membrane, ear canal and external ear normal. No drainage or tenderness. No middle ear effusion. Tympanic membrane is not erythematous.     Left Ear: Tympanic membrane, ear canal and external ear normal. No drainage or tenderness.  No middle ear effusion. Tympanic membrane is not erythematous.     Nose: Nose normal. No congestion or rhinorrhea.     Mouth/Throat:     Mouth: Mucous membranes are moist. No oral lesions.     Pharynx: No pharyngeal swelling, oropharyngeal exudate, posterior oropharyngeal erythema or uvula swelling.     Tonsils: No tonsillar exudate or tonsillar abscesses.     Comments: Airway is patent, no oral swelling. Eyes:     General: No scleral icterus.       Right eye: No  discharge.        Left eye: No discharge.     Extraocular Movements: Extraocular movements intact.     Right eye: Normal extraocular motion.     Left eye: Normal extraocular motion.     Conjunctiva/sclera: Conjunctivae normal.     Pupils: Pupils are equal, round, and reactive to light.  Cardiovascular:     Rate and Rhythm: Normal rate.  Pulmonary:     Effort: Pulmonary effort is normal.  Musculoskeletal:     Cervical back: Normal range of motion and neck supple.  Lymphadenopathy:     Cervical: No cervical adenopathy.  Skin:    General: Skin is warm and dry.     Findings: Rash (Macular rash over the right side of her face, and to a lesser extent over the upper chest) present.  Neurological:     General: No focal deficit present.     Mental Status: She is alert and oriented to person, place, and time.  Psychiatric:        Mood and Affect: Mood normal.        Behavior: Behavior normal.        Thought Content: Thought content normal.        Judgment: Judgment normal.        Assessment and Plan :   PDMP not reviewed this encounter.  1. Rash and nonspecific skin eruption   2. Itching    Recommended patient switch to a detergent that is good for gentle skin.  Will use topical treatment for now.  Recommended follow-up with a new dermatologist and provided her with information for this. Counseled patient on potential for adverse effects with medications prescribed/recommended today, ER and return-to-clinic precautions discussed, patient verbalized understanding.    Jaynee Eagles, PA-C 10/27/21 1220

## 2021-11-01 ENCOUNTER — Telehealth: Payer: Self-pay | Admitting: Urgent Care

## 2021-11-01 MED ORDER — HYDROCORTISONE 1 % EX CREA: TOPICAL_CREAM | CUTANEOUS | 0 refills | Status: DC

## 2021-11-01 NOTE — Telephone Encounter (Signed)
Patient requested something besides Elidel cream.  Sent a prescription for hydrocortisone cream 1%.

## 2021-12-19 ENCOUNTER — Ambulatory Visit
Admission: EM | Admit: 2021-12-19 | Discharge: 2021-12-19 | Disposition: A | Discharge status: Home / Self Care | Payer: 59 | Attending: Student | Admitting: Student

## 2021-12-19 ENCOUNTER — Other Ambulatory Visit: Payer: Self-pay

## 2021-12-19 DIAGNOSIS — R051 Acute cough: Secondary | ICD-10-CM

## 2021-12-19 DIAGNOSIS — J069 Acute upper respiratory infection, unspecified: Secondary | ICD-10-CM

## 2021-12-19 MED ORDER — DM-GUAIFENESIN ER 30-600 MG PO TB12: 1.0000 | ORAL_TABLET | Freq: Two times a day (BID) | ORAL | 0 refills | Status: DC | PRN

## 2021-12-19 MED ORDER — PROMETHAZINE-DM 6.25-15 MG/5ML PO SYRP: 2.5000 mL | ORAL_SOLUTION | Freq: Four times a day (QID) | ORAL | 0 refills | Status: DC | PRN

## 2021-12-19 NOTE — ED Provider Notes (Signed)
[facility]    CSN: [id number] Arrival date & time: 12/19/21  7829      History   Chief Complaint Chief Complaint  Patient presents with   Cough   Generalized Body Aches    HPI Kim [name] is a 80 y.o. female presenting with viral syndrome x4 days. Medical history noncontributroy, denies history pulm ds. Describes nonproductive cough, mild myalgias, nasal congestion. Has not monitored temperature at home.  She denies history of pulmonary disease but does use an albuterol inhaler daily, has been using this as directed without increase in shortness  of breath or dyspnea.  Denies chest pain, dizziness, weakness.  Denies fevers or chills.  Has taken various over-the-counter medications with some relief, states that she needs additional relief for the cough throughout the day.  Denies known sick exposure.  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 Authorizing Provider   dextromethorphan-guaiFENesin (MUCINEX DM) 30-600 MG 12hr tablet Take 1 tablet by mouth 2 (two) times daily as needed for cough. 12/19/21  Yes Hazel Sams, PA-C  promethazine-dextromethorphan (PROMETHAZINE-DM) 6.25-15 MG/5ML syrup Take 2.5 mLs by mouth 4 (four) times daily as needed for cough. 12/19/21  Yes Hazel Sams, PA-C  albuterol (PROVENTIL HFA;VENTOLIN HFA) 108 (90 Base) MCG/ACT inhaler Inhale 1-2 puffs into the lungs every 6 (six) hours as needed for wheezing or shortness of breath. 03/05/18   Long, Wonda Olds, MD  ASPERCREME LIDOCAINE EX Apply 1 application topically in the morning and at bedtime. Knee pain. Patient not taking: Reported on 04/25/2021    [provider]  Cholecalciferol (VITAMIN D3) 50 MCG (2000 UT) capsule Take 2,000 Units by mouth daily.    [provider]  hydrocortisone cream 1 % Apply to affected area 2 times daily 11/01/21   Jaynee Eagles, PA-C  ondansetron (ZOFRAN ODT) 4 MG disintegrating tablet Take 1 tablet (4 mg total) by mouth every 8 (eight) hours as needed for nausea or vomiting. Patient not taking: Reported on 04/25/2021 01/09/21   Emerson Monte, FNP  oxyCODONE (ROXICODONE) 5 MG immediate release tablet Take 1 tablet (5 mg  total) by mouth every 6 (six) hours as needed for severe pain. Patient not taking: Reported on 07/12/2021 03/31/21   Mordecai Rasmussen, MD  pimecrolimus (ELIDEL) 1 % cream Use twice daily over the face. 10/27/21   Jaynee Eagles, PA-C  potassium chloride (MICRO-K) 10 MEQ CR capsule Take 10 mEq by mouth daily. 08/29/20   [provider]  triamcinolone cream (KENALOG) 0.1 % Apply 1 application topically 2 (two) times daily. 10/27/21   Jaynee Eagles, PA-C  triamterene-hydrochlorothiazide (MAXZIDE-25) 37.5-25 MG tablet Take 1 tablet by mouth daily.  01/09/17   [provider]    Family History Family History  Problem Relation Age of Onset   Heart failure Mother    Stroke Mother    Heart failure Father    Stroke  Father    Hypertension Sister     Social History Social History   Tobacco Use   Smoking status: Never   Smokeless tobacco: Never  Vaping Use   Vaping Use: Never used  Substance Use Topics   Alcohol use: No   Drug use: No     Allergies   Tramadol   Review of Systems Review of Systems  Constitutional:  Negative for appetite change, chills and fever.  HENT:  Positive for congestion. Negative for ear pain, rhinorrhea, sinus pressure, sinus pain and sore throat.   Eyes:  Negative for redness and visual disturbance.  Respiratory:  Positive for cough. Negative for chest tightness, shortness of breath and wheezing.   Cardiovascular:  Negative for chest pain and palpitations.  Gastrointestinal:  Negative for abdominal pain, constipation, diarrhea, nausea and vomiting.  Genitourinary:  Negative for dysuria, frequency and urgency.  Musculoskeletal:  Positive for myalgias.  Neurological:  Negative for dizziness, weakness and headaches.  Psychiatric/Behavioral:  Negative for confusion.   All other systems reviewed and are negative.   Physical Exam Triage Vital Signs ED Triage Vitals  Enc Vitals Group     BP 12/19/21 1057 120/79     Pulse Rate 12/19/21 1057 93     Resp 12/19/21 1057 20     Temp 12/19/21 1057 98.5 F (36.9 C)     Temp Source 12/19/21 1057 Oral     SpO2 12/19/21 1057 94 %     Weight --      Height --      Head Circumference --      Peak Flow --      Pain Score 12/19/21 1055 0     Pain Loc --      Pain Edu? --      Excl. in [place]? --    No data found.  Updated Vital Signs BP 120/79 (BP Location: Right Arm)    Pulse 93    Temp 98.5 F (36.9 C) (Oral)    Resp 20    SpO2 94%   Visual Acuity Right Eye Distance:   Left Eye Distance:   Bilateral Distance:    Right Eye Near:   Left Eye Near:    Bilateral Near:     Physical Exam Vitals reviewed.  Constitutional:      General: She is not in acute distress.    Appearance: Normal appearance. She is not  ill-appearing.  HENT:     Head: Normocephalic and atraumatic.     Right Ear: Tympanic membrane, ear canal and external ear normal. No tenderness. No middle ear effusion. There is no impacted cerumen. Tympanic membrane is not perforated, erythematous, retracted or bulging.  Left Ear: Tympanic membrane, ear canal and external ear normal. No tenderness.  No middle ear effusion. There is no impacted cerumen. Tympanic membrane is not perforated, erythematous, retracted or bulging.     Nose: Nose normal. No congestion.     Mouth/Throat:     Mouth: Mucous membranes are moist.     Pharynx: Uvula midline. No oropharyngeal exudate or posterior oropharyngeal erythema.  Eyes:     Extraocular Movements: Extraocular movements intact.     Pupils: Pupils are equal, round, and reactive to light.  Cardiovascular:     Rate and Rhythm: Normal rate and regular rhythm.     Heart sounds: Normal heart sounds.  Pulmonary:     Effort: Pulmonary effort is normal.     Breath sounds: Normal breath sounds. No decreased breath sounds, wheezing, rhonchi or rales.  Abdominal:     Palpations: Abdomen is soft.     Tenderness: There is no abdominal tenderness. There is no guarding or rebound.  Lymphadenopathy:     Cervical: No cervical adenopathy.     Right cervical: No superficial cervical adenopathy.    Left cervical: No superficial cervical adenopathy.  Neurological:     General: No focal deficit present.     Mental Status: She is alert and oriented to person, place, and time.  Psychiatric:        Mood and Affect: Mood normal.        Behavior: Behavior normal.        Thought Content: Thought content normal.        Judgment: Judgment normal.     UC Treatments / Results  Labs (all labs ordered are listed, but only abnormal results are displayed) Labs Reviewed  COVID-19, FLU A+B NAA    EKG   Radiology No results found.  Procedures Procedures (including critical care time)  Medications Ordered in  UC Medications - No data to display  Initial Impression / Assessment and Plan / UC Course  I have reviewed the triage vital signs and the nursing notes.  Pertinent labs & imaging results that were available during my care of the patient were reviewed by me and considered in my medical decision making (see chart for details).     This patient is a very pleasant 80 y.o. year old female presenting with viral syndrome x4 days. Today this pt is afebrile nontachycardic nontachypneic, oxygenating well on room air, no wheezes rhonchi or rales.    Covid and influenza PCR sent.   She has had her influenza shot this year. Uses albuterol inhaler daily at baseline though denies history pulm ds. Denies increase in SOB or inhaler use. Declines refills of this today. She is out of tamiflu window. Given mild symptoms we will defer tamiflu. She is in agreement.  Reassuring exam and vitals. She is adamant that she needs relief of her cough throughout the day. Mucinex for daytime, low-dose promethazine DM for nighttime.   ED return precautions discussed. Patient verbalizes understanding and agreement.       Final Clinical Impressions(s) / UC Diagnoses   Final diagnoses:  Viral URI with cough     Discharge Instructions      -Mucinex DM during the day -Promethazine DM cough syrup for congestion/cough. This could make you drowsy, so take at night before bed. -With a virus, you're typically contagious for 5-7 days, or as long as you're having fevers.       ED Prescriptions     Medication Sig Dispense Auth. Provider  promethazine-dextromethorphan (PROMETHAZINE-DM) 6.25-15 MG/5ML syrup Take 2.5 mLs by mouth 4 (four) times daily as needed for cough. 50 mL Hazel Sams, PA-C   dextromethorphan-guaiFENesin [facility] DM) 30-600 MG 12hr tablet Take 1 tablet by mouth 2 (two) times daily as needed for cough. 14 tablet Hazel Sams, PA-C      PDMP not reviewed this encounter.   Hazel Sams, PA-C 12/19/21 1129

## 2021-12-19 NOTE — ED Triage Notes (Signed)
Pt reports cough, body aches and nasal congestion x 4 days.

## 2021-12-19 NOTE — Discharge Instructions (Addendum)
-  Mucinex DM during the day -Promethazine DM cough syrup for congestion/cough. This could make you drowsy, so take at night before bed. -With a virus, you're typically contagious for 5-7 days, or as long as you're having fevers.

## 2021-12-20 LAB — COVID-19, FLU A+B NAA
Influenza A, NAA: NOT DETECTED
Influenza B, NAA: NOT DETECTED
SARS-CoV-2, NAA: NOT DETECTED

## 2021-12-22 ENCOUNTER — Ambulatory Visit: Payer: 59 | Admitting: Podiatry

## 2022-01-30 ENCOUNTER — Other Ambulatory Visit: Payer: Self-pay

## 2022-01-30 ENCOUNTER — Encounter: Payer: Self-pay | Admitting: Emergency Medicine

## 2022-01-30 ENCOUNTER — Ambulatory Visit
Admission: EM | Admit: 2022-01-30 | Discharge: 2022-01-30 | Disposition: A | Discharge status: Home / Self Care | Payer: 59 | Attending: Emergency Medicine | Admitting: Emergency Medicine

## 2022-01-30 DIAGNOSIS — J069 Acute upper respiratory infection, unspecified: Secondary | ICD-10-CM | POA: Diagnosis not present

## 2022-01-30 DIAGNOSIS — Z20822 Contact with and (suspected) exposure to covid-19: Secondary | ICD-10-CM

## 2022-01-30 DIAGNOSIS — U071 COVID-19: Secondary | ICD-10-CM

## 2022-01-30 DIAGNOSIS — Z9189 Other specified personal risk factors, not elsewhere classified: Secondary | ICD-10-CM | POA: Diagnosis not present

## 2022-01-30 MED ORDER — FLUTICASONE PROPIONATE 50 MCG/ACT NA SUSP: 2.0000 | Freq: Every day | NASAL | 0 refills | Status: DC

## 2022-01-30 MED ORDER — OSELTAMIVIR PHOSPHATE 75 MG PO CAPS: 75.0000 mg | ORAL_CAPSULE | Freq: Two times a day (BID) | ORAL | 0 refills | Status: DC

## 2022-01-30 MED ORDER — PROMETHAZINE-DM 6.25-15 MG/5ML PO SYRP
5.0000 mL | ORAL_SOLUTION | Freq: Four times a day (QID) | ORAL | 0 refills | Status: DC | PRN
Start: 2022-01-30 — End: 2022-09-18

## 2022-01-30 MED ORDER — AEROCHAMBER PLUS MISC: 2 refills | Status: AC

## 2022-01-30 NOTE — ED Provider Notes (Signed)
HPI  SUBJECTIVE:  Kim [name] is a 81 y.o. female who presents with 3 days of body aches, headaches, nasal congestion,  rhinorrhea, sinus pain and pressure, postnasal drip, cough occasionally productive of white sputum.  She denies fevers, sore throat, facial swelling, upper dental pain, wheezing, change in her baseline shortness of breath, loss of sense of smell or taste, nausea, vomiting, diarrhea, abdominal pain.  No known COVID or flu exposure.  She got the second dose of the COVID-vaccine.  She got this years flu vaccine.  She is unable to sleep at night secondary to the cough.  She has tried Promethazine DM, Alka-Seltzer cold with improvement in her symptoms.  No aggravating factors.  She has not required her inhaler.  No antibiotics in the past month.  No antipyretics in the past 6 hours.  She has a past medical history of shortness of breath for which she is prescribed an inhaler, rheumatoid arthritis, hypertension.   Past Medical History:  Diagnosis Date   Acid reflux    Arthritis    Back spasm    Carpal tunnel syndrome    Hypertension    Rheumatoid arthritis [facility])     Past Surgical History:  Procedure Laterality Date   KNEE SURGERY     ORIF TOE FRACTURE Left 02/27/2021   Procedure: OPEN REDUCTION INTERNAL FIXATION (ORIF) METATARSAL (TOE) FRACTURE;  Surgeon: Mordecai Rasmussen, MD;  Location: [facility];  Service: Orthopedics;  Laterality: Left;  ORIF of 1st and 2nd metatarsal fractures   TUBAL LIGATION      Family History  Problem Relation Age of Onset   Heart failure Mother    Stroke Mother    Heart failure Father    Stroke Father    Hypertension Sister     Social History   Tobacco Use   Smoking status: Never   Smokeless tobacco: Never  Vaping Use   Vaping Use: Never used  Substance Use Topics   Alcohol use: No   Drug use: No    No current facility-administered medications for this encounter.  Current Outpatient Medications:    fluticasone (FLONASE) 50 MCG/ACT  nasal spray, Place 2 sprays into both nostrils daily., Disp: 16 g, Rfl: 0   oseltamivir (TAMIFLU) 75 MG capsule, Take 1 capsule (75 mg total) by mouth 2 (two) times daily. X 5 days, Disp: 10 capsule, Rfl: 0   promethazine-dextromethorphan (PROMETHAZINE-DM) 6.25-15 MG/5ML syrup, Take 5 mLs by mouth 4 (four) times daily as needed for cough., Disp: 118 mL, Rfl: 0   Spacer/Aero-Holding Chambers (AEROCHAMBER PLUS) inhaler, Use with inhaler, Disp: 1 each, Rfl: 2   albuterol (PROVENTIL HFA;VENTOLIN HFA) 108 (90 Base) MCG/ACT inhaler, Inhale 1-2 puffs into the lungs every 6 (six) hours as needed for wheezing or shortness of breath., Disp: 1 Inhaler, Rfl: 0   Cholecalciferol (VITAMIN D3) 50 MCG (2000 UT) capsule, Take 2,000 Units by mouth daily., Disp: , Rfl:    hydrocortisone cream 1 %, Apply to affected area 2 times daily, Disp: 15 g, Rfl: 0   pimecrolimus (ELIDEL) 1 % cream, Use twice daily over the face., Disp: 30 g, Rfl: 0   potassium chloride (MICRO-K) 10 MEQ CR capsule, Take 10 mEq by mouth daily., Disp: , Rfl:    triamcinolone cream (KENALOG) 0.1 %, Apply 1 application topically 2 (two) times daily., Disp: 30 g, Rfl: 0   triamterene-hydrochlorothiazide (MAXZIDE-25) 37.5-25 MG tablet, Take 1 tablet by mouth daily. , Disp: , Rfl:   Allergies  Allergen Reactions  Tramadol Hives and Nausea And Vomiting     ROS  As noted in HPI.   Physical Exam  BP (!) 142/78 (BP Location: Right Arm)    Pulse 98    Temp 99 F (37.2 C) (Oral)    Resp 18    SpO2 94%   Constitutional: Well developed, well nourished, no acute distress Eyes:  EOMI, conjunctiva normal bilaterally HENT: Normocephalic, atraumatic,mucus membranes moist.  Positive clear nasal congestion.  Normal turbinates.  Positive frontal sinus tenderness.  Positive cobblestoning.  No postnasal drip. Neck: No cervical lymphadenopathy Respiratory: Normal inspiratory effort, lungs clear bilaterally.  Positive lateral chest wall  tenderness Cardiovascular: Normal rate, regular rhythm, no murmurs, rubs, gallops GI: nondistended skin: No rash, skin intact Musculoskeletal: no deformities Neurologic: Alert & oriented x 3, no focal neuro deficits Psychiatric: Speech and behavior appropriate   ED Course   Medications - No data to display  Orders Placed This Encounter  Procedures   Covid-19, Flu A+B ([facility])    Standing Status:   Standing    Number of Occurrences:   1   Results for orders placed or performed during the hospital encounter of 01/30/22  Covid-19, Flu A+B ([facility])   Specimen: Nasopharyngeal   Naso  Result Value Ref Range   SARS-CoV-2, NAA Detected (A) Not Detected   Influenza A, NAA Not Detected Not Detected   Influenza B, NAA Not Detected Not Detected   Test Information: Comment     No results found for this or any previous visit (from the past 24 hour(s)). No results found.  ED Clinical Impression  1. COVID-19 virus infection   2. At increased risk of exposure to COVID-19 virus   3. Viral upper respiratory tract infection with cough   4. Encounter for laboratory testing for COVID-19 virus      ED Assessment/Plan  Patient with viral URI, concern for influenza.  Home with Tylenol, Mucinex, Flonase, saline nasal irrigation, regularly scheduled albuterol with a spacer for the next 4 days, Promethazine DM. She does not need a prescription for albuterol. Will start on Tamiflu today, she will discontinue it if her flu is negative.  Today's last day to start the Tamiflu.  If COVID is positive, she will qualify for Molnupiravir because of the history of rheumatoid arthritis, hypertension and age.  Follow-up with PMD as needed.  ER return precautions given.  COVID-positive.  Will have staff contact patient, start her Molnupiravir, discontinue Tamiflu.  Discussed labs, MDM, treatment plan, and plan for follow-up with patient. Discussed sn/sx that should prompt return to the ED. patient agrees  with plan.   Meds ordered this encounter  Medications   promethazine-dextromethorphan (PROMETHAZINE-DM) 6.25-15 MG/5ML syrup    Sig: Take 5 mLs by mouth 4 (four) times daily as needed for cough.    Dispense:  118 mL    Refill:  0   Spacer/Aero-Holding Chambers (AEROCHAMBER PLUS) inhaler    Sig: Use with inhaler    Dispense:  1 each    Refill:  2    Please educate patient on use   fluticasone (FLONASE) 50 MCG/ACT nasal spray    Sig: Place 2 sprays into both nostrils daily.    Dispense:  16 g    Refill:  0   oseltamivir (TAMIFLU) 75 MG capsule    Sig: Take 1 capsule (75 mg total) by mouth 2 (two) times daily. X 5 days    Dispense:  10 capsule    Refill:  0      *  This clinic note was created using [employer]. Therefore, there may be occasional mistakes despite careful proofreading.  ?    Melynda Ripple, MD 02/01/22 [phone]

## 2022-01-30 NOTE — Discharge Instructions (Addendum)
May take 500 to 1000 mg of Tylenol 3-4 times a day as needed for body aches, headaches.  Mucinex, Flonase, saline nasal irrigation with a Milta Deiters Med rinse and distilled water as often as you want for the nasal congestion and postnasal drip.  2 puffs from your albuterol inhaler using your spacer every 4 hours for 2 days, then every 6 hours for 2 days, then as needed.  You may back off on the albuterol if you start to feel better soon.  Promethazine DM cough syrup as needed for cough.  I am starting you on Tamiflu today because you will be out of the treatment window if we wait another day.  Discontinue the Tamiflu if your flu is negative.  We will prescribe an antiviral such as Molnupiravir if your COVID is positive.

## 2022-01-30 NOTE — ED Triage Notes (Signed)
Cough since this weekend.  Some body aches, and runny nose.

## 2022-01-31 LAB — COVID-19, FLU A+B NAA
Influenza A, NAA: NOT DETECTED
Influenza B, NAA: NOT DETECTED
SARS-CoV-2, NAA: DETECTED — AB

## 2022-02-01 ENCOUNTER — Telehealth (HOSPITAL_COMMUNITY): Payer: Self-pay | Admitting: Emergency Medicine

## 2022-02-01 MED ORDER — MOLNUPIRAVIR EUA 200MG CAPSULE: 4.0000 | ORAL_CAPSULE | Freq: Two times a day (BID) | ORAL | 0 refills | Status: AC

## 2022-02-13 ENCOUNTER — Ambulatory Visit: Payer: 59

## 2022-02-13 ENCOUNTER — Ambulatory Visit (INDEPENDENT_AMBULATORY_CARE_PROVIDER_SITE_OTHER): Payer: 59 | Admitting: Orthopedic Surgery

## 2022-02-13 ENCOUNTER — Encounter: Payer: Self-pay | Admitting: Orthopedic Surgery

## 2022-02-13 ENCOUNTER — Other Ambulatory Visit: Payer: Self-pay

## 2022-02-13 DIAGNOSIS — S92315D Nondisplaced fracture of first metatarsal bone, left foot, subsequent encounter for fracture with routine healing: Secondary | ICD-10-CM | POA: Diagnosis not present

## 2022-02-13 DIAGNOSIS — S92335D Nondisplaced fracture of third metatarsal bone, left foot, subsequent encounter for fracture with routine healing: Secondary | ICD-10-CM

## 2022-02-13 DIAGNOSIS — S92322D Displaced fracture of second metatarsal bone, left foot, subsequent encounter for fracture with routine healing: Secondary | ICD-10-CM

## 2022-02-13 DIAGNOSIS — S92355D Nondisplaced fracture of fifth metatarsal bone, left foot, subsequent encounter for fracture with routine healing: Secondary | ICD-10-CM

## 2022-02-13 DIAGNOSIS — S92345D Nondisplaced fracture of fourth metatarsal bone, left foot, subsequent encounter for fracture with routine healing: Secondary | ICD-10-CM

## 2022-02-13 NOTE — Progress Notes (Signed)
Orthopaedic Postop Note  Assessment: Kim [name] is a 81 y.o. female s/p ORIF of L 1st and 2nd metatarsal fracture; nonoperative management of L 3rd, 4th and 5th metatarsal shaft fractures   DOS: 02/27/2021  Plan: She is recovered well following surgery.  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  There is some prominence of the hardware, but she is not interested in removal.  Recommend she continue to ambulate with a cane.  No restrictions.  Follow-up as needed.  Follow-up: Return if symptoms worsen or fail to improve. XR at next visit: L foot  Subjective:  Chief Complaint  Patient presents with   Foot Injury    LT/ closed nondisplaced fracture of first metatarsal bone of LT foot     History of Present Illness: Kim [name] is a 81 y.o. female who presents following the above stated procedure.  Surgery was approximately 1 year ago.  She has recovered well.  She is ambulating well with the assistance of a cane.  She has some pain in the medial border of the foot.  She also notes that her second toe is prominent, extending further than her great toe.  Mild callosity at the distal toe.  She notes the hardware is prominent, but she is not interested in surgery.  Review of Systems: No fevers or chills No numbness or tingling No Chest Pain No shortness of breath    Objective: There were no vitals taken for this visit.  Physical Exam:  Elderly female Alert and oriented, no acute distress. Ambulates with the assistance of a cane.   Left foot with well-healed surgical incisions.  Flatfoot deformity.  Mild prominence of the hardware over the great toe.  Mild tenderness to palpation over the midfoot.  Sensations intact distally.  Mild callosity over the distal aspect of the second toe.  IMAGING: I personally ordered and reviewed the following images:  X-rays of the left foot were obtained in clinic today.  Hardware remains in place.  No evidence of hardware failure  or loosening.  Interval consolidation of the fractures.  Overall alignment remains unchanged.  Impression: Healing metatarsal fractures without hardware failure  Mordecai Rasmussen, MD 02/13/2022 10:27 AM

## 2022-07-02 ENCOUNTER — Other Ambulatory Visit (HOSPITAL_COMMUNITY): Payer: Self-pay | Admitting: Family Medicine

## 2022-07-02 DIAGNOSIS — Z1231 Encounter for screening mammogram for malignant neoplasm of breast: Secondary | ICD-10-CM

## 2022-07-19 ENCOUNTER — Ambulatory Visit (HOSPITAL_COMMUNITY)
Admission: RE | Admit: 2022-07-19 | Discharge: 2022-07-19 | Disposition: A | Discharge status: Home / Self Care | Payer: 59 | Source: Ambulatory Visit | Attending: Family Medicine | Admitting: Family Medicine

## 2022-07-19 DIAGNOSIS — Z1231 Encounter for screening mammogram for malignant neoplasm of breast: Secondary | ICD-10-CM | POA: Insufficient documentation

## 2022-09-18 ENCOUNTER — Ambulatory Visit
Admission: EM | Admit: 2022-09-18 | Discharge: 2022-09-18 | Disposition: A | Discharge status: Home / Self Care | Payer: 59 | Attending: Nurse Practitioner | Admitting: Nurse Practitioner

## 2022-09-18 DIAGNOSIS — J069 Acute upper respiratory infection, unspecified: Secondary | ICD-10-CM

## 2022-09-18 MED ORDER — ALBUTEROL SULFATE HFA 108 (90 BASE) MCG/ACT IN AERS: 1.0000 | INHALATION_SPRAY | Freq: Four times a day (QID) | RESPIRATORY_TRACT | 0 refills | Status: DC | PRN

## 2022-09-18 MED ORDER — PROMETHAZINE-DM 6.25-15 MG/5ML PO SYRP: 5.0000 mL | ORAL_SOLUTION | Freq: Every evening | ORAL | 0 refills | Status: DC | PRN

## 2022-09-18 MED ORDER — GUAIFENESIN ER 600 MG PO TB12
600.0000 mg | ORAL_TABLET | Freq: Two times a day (BID) | ORAL | 0 refills | Status: DC
Start: 2022-09-18 — End: 2023-04-10

## 2022-09-18 NOTE — ED Triage Notes (Signed)
Pt reports cough, body aches x 1 week. Promethazine gives relief.   Pt requested promethazine refill.

## 2022-09-18 NOTE — ED Provider Notes (Signed)
[facility]    CSN: [id number] Arrival date & time: 09/18/22  1112      History   Chief Complaint Chief Complaint  Patient presents with   Cough   Medication Refill         HPI Kim [name] is a 81 y.o. female.   Patient presents with 1 week of productive cough.  She also endorses body aches, shortness of breath that improves with albuterol, wheezing, chest congestion, sneezing, headache, and fatigue.  She denies fever, chest tightness or pain, nasal congestion, runny nose, post nasal drainage, sore throat, abdominal pain, nausea/vomiting, diarrhea, new rash, and decreased appetite.  Reports daughter was sick with similar symptoms.  Has been taking promethazine-dextromethorphan, airborne, and mucinex with minimal relief.  She is requesting more cough syrup today.   She has not tested for COVID-19 so far.      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 Authorizing Provider  guaiFENesin (MUCINEX)  600 MG 12 hr tablet Take 1 tablet (600 mg total) by mouth 2 (two) times daily. 09/18/22  Yes Eulogio Bear, NP  albuterol (VENTOLIN HFA) 108 (90 Base) MCG/ACT inhaler Inhale 1-2 puffs into the lungs every 6 (six) hours as needed for wheezing or shortness of breath. 09/18/22   Eulogio Bear, NP  Cholecalciferol (VITAMIN D3) 50 MCG (2000 UT) capsule Take 2,000 Units by mouth daily.    [provider]  fluticasone (FLONASE) 50 MCG/ACT nasal spray Place 2 sprays into both nostrils daily. 01/30/22   Melynda Ripple, MD  hydrocortisone cream 1 % Apply to affected area 2 times daily 11/01/21   Jaynee Eagles, PA-C  oseltamivir (TAMIFLU) 75 MG capsule Take 1 capsule (75 mg total) by mouth 2 (two) times daily. X 5 days 01/30/22   Melynda Ripple, MD  pimecrolimus (ELIDEL) 1 % cream Use twice daily over the face. 10/27/21   Jaynee Eagles, PA-C  potassium chloride (MICRO-K) 10 MEQ CR capsule Take 10 mEq by mouth daily. 08/29/20   [provider]  promethazine-dextromethorphan (PROMETHAZINE-DM) 6.25-15 MG/5ML syrup Take 5 mLs by mouth at bedtime as needed for cough.  Do not take with alcohol or while driving or operating heavy machinery 09/18/22   Eulogio Bear, NP  Spacer/Aero-Holding Chambers (AEROCHAMBER PLUS) inhaler Use with inhaler 01/30/22   Melynda Ripple, MD  triamcinolone cream (KENALOG) 0.1 % Apply 1 application topically 2 (two) times daily. 10/27/21   Jaynee Eagles, PA-C  triamterene-hydrochlorothiazide (MAXZIDE-25) 37.5-25 MG tablet Take 1 tablet by mouth daily.  01/09/17   [provider]    Family History Family History  Problem Relation Age of Onset   Heart failure Mother    Stroke Mother    Heart failure Father    Stroke Father    Hypertension Sister     Social History Social History   Tobacco Use   Smoking status: Never   Smokeless tobacco: Never  Vaping Use   Vaping Use: Never used  Substance Use Topics   Alcohol use: No   Drug use: No      Allergies   Tramadol   Review of Systems Review of Systems Per HPI  Physical Exam Triage Vital Signs ED Triage Vitals  Enc Vitals Group     BP 09/18/22 1219 (!) 147/73     Pulse Rate 09/18/22 1219 74     Resp 09/18/22 1219 20     Temp 09/18/22 1219 98.1 F (36.7 C)     Temp Source 09/18/22 1219 Oral     SpO2 09/18/22 1219 97 %     Weight --      Height --      Head Circumference --      Peak Flow --      Pain Score 09/18/22 1218 0     Pain Loc --      Pain Edu? --      Excl. in [place]? --    No data found.  Updated Vital Signs BP (!) 147/73 (BP Location: Right Arm)   Pulse 74   Temp 98.1 F (36.7 C) (Oral)   Resp 20   SpO2 97%   Visual Acuity Right Eye Distance:   Left Eye Distance:   Bilateral Distance:    Right Eye Near:   Left Eye Near:    Bilateral Near:     Physical Exam Vitals and nursing note reviewed.  Constitutional:      General: She is not in acute distress.    Appearance: Normal appearance. She is not ill-appearing or toxic-appearing.  HENT:     Head: Normocephalic and atraumatic.     Right Ear: Tympanic membrane, ear canal and external ear normal.     Left Ear: Tympanic membrane, ear canal and external ear normal.     Nose: Congestion and rhinorrhea present.     Mouth/Throat:     Mouth: Mucous membranes are moist.     Pharynx: Oropharynx is clear. Posterior oropharyngeal erythema present. No oropharyngeal exudate.  Eyes:     General: No scleral icterus.    Extraocular Movements: Extraocular movements intact.  Cardiovascular:     Rate and Rhythm: Normal rate and regular rhythm.     Heart sounds: Normal heart sounds. No murmur heard. Pulmonary:     Effort: Pulmonary effort is normal. No respiratory distress.     Breath sounds: Normal breath sounds. No wheezing, rhonchi or rales.  Abdominal:     General: Abdomen is flat. Bowel sounds are normal. There is no distension.     Palpations: Abdomen is soft.  Musculoskeletal:      Cervical back: Normal range of motion and  neck supple.  Lymphadenopathy:     Cervical: Cervical adenopathy present.  Skin:    General: Skin is warm and dry.     Coloration: Skin is not jaundiced or pale.     Findings: No erythema or rash.  Neurological:     Mental Status: She is alert and oriented to person, place, and time.     Motor: No weakness.  Psychiatric:        Mood and Affect: Mood normal.        Behavior: Behavior normal.      UC Treatments / Results  Labs (all labs ordered are listed, but only abnormal results are displayed) Labs Reviewed - No data to display  EKG   Radiology No results found.  Procedures Procedures (including critical care time)  Medications Ordered in UC Medications - No data to display  Initial Impression / Assessment and Plan / UC Course  I have reviewed the triage vital signs and the nursing notes.  Pertinent labs & imaging results that were available during my care of the patient were reviewed by me and considered in my medical decision making (see chart for details).    Patient is well-appearing, normotensive, afebrile, not tachycardic, not tachypneic, oxygenating well on room air.  Symptoms are consistent with postviral cough.  Supportive care discussed.  Start cough suppressant as needed at nighttime.  Recommended use of albuterol inhaler every 4-6 hours as needed for shortness of breath/wheezing.  Return precautions and ER precautions discussed.  The patient was given the opportunity to ask questions.  All questions answered to their satisfaction.  The patient is in agreement to this plan.   Final Clinical Impressions(s) / UC Diagnoses   Final diagnoses:  Viral URI with cough     Discharge Instructions      Your symptoms and exam findings are most consistent with a viral upper respiratory infection.  The cough may last for a few weeks before it fully improves.    Some things that can make you feel better are: - Increased  rest - Increasing fluid with water/sugar free electrolytes - Acetaminophen and ibuprofen as needed for fever/pain.  - Salt water gargling, chloraseptic spray and throat lozenges - OTC guaifenesin (Mucinex) 600 mg twice daily.  - Saline sinus flushes or a neti pot.  - Humidifying the air. - Cough syrup at night time as needed.   Please use the albuterol inhaler every 4-6 hours as you need it.       ED Prescriptions     Medication Sig Dispense Auth. Provider   guaiFENesin (MUCINEX) 600 MG 12 hr tablet Take 1 tablet (600 mg total) by mouth 2 (two) times daily. 30 tablet Noemi Chapel A, NP   promethazine-dextromethorphan (PROMETHAZINE-DM) 6.25-15 MG/5ML syrup Take 5 mLs by mouth at bedtime as needed for cough. Do not take with alcohol or while driving or operating heavy machinery 118 mL Noemi Chapel A, NP   albuterol (VENTOLIN HFA) 108 (90 Base) MCG/ACT inhaler Inhale 1-2 puffs into the lungs every 6 (six) hours as needed for wheezing or shortness of breath. 1 each Eulogio Bear, NP      PDMP not reviewed this encounter.   Eulogio Bear, NP 09/18/22 1717

## 2022-09-18 NOTE — Discharge Instructions (Signed)
Your symptoms and exam findings are most consistent with a viral upper respiratory infection.  The cough may last for a few weeks before it fully improves.    Some things that can make you feel better are: - Increased rest - Increasing fluid with water/sugar free electrolytes - Acetaminophen and ibuprofen as needed for fever/pain.  - Salt water gargling, chloraseptic spray and throat lozenges - OTC guaifenesin (Mucinex) 600 mg twice daily.  - Saline sinus flushes or a neti pot.  - Humidifying the air. - Cough syrup at night time as needed.   Please use the albuterol inhaler every 4-6 hours as you need it.

## 2023-02-15 ENCOUNTER — Other Ambulatory Visit (HOSPITAL_COMMUNITY): Payer: Self-pay | Admitting: Family Medicine

## 2023-02-15 DIAGNOSIS — R600 Localized edema: Secondary | ICD-10-CM

## 2023-02-15 DIAGNOSIS — R0602 Shortness of breath: Secondary | ICD-10-CM

## 2023-02-18 ENCOUNTER — Ambulatory Visit (HOSPITAL_COMMUNITY)
Admission: RE | Admit: 2023-02-18 | Discharge: 2023-02-18 | Disposition: A | Discharge status: Home / Self Care | Payer: 59 | Source: Ambulatory Visit | Attending: Family Medicine | Admitting: Family Medicine

## 2023-02-18 DIAGNOSIS — R0602 Shortness of breath: Secondary | ICD-10-CM | POA: Insufficient documentation

## 2023-03-21 ENCOUNTER — Ambulatory Visit (HOSPITAL_COMMUNITY)
Admission: RE | Admit: 2023-03-21 | Discharge: 2023-03-21 | Disposition: A | Discharge status: Home / Self Care | Payer: 59 | Source: Ambulatory Visit | Attending: Family Medicine | Admitting: Family Medicine

## 2023-03-21 DIAGNOSIS — R600 Localized edema: Secondary | ICD-10-CM | POA: Diagnosis present

## 2023-03-21 LAB — ECHOCARDIOGRAM COMPLETE
AR max vel: 2.28 cm2
AV Area VTI: 2.24 cm2
AV Area mean vel: 2.36 cm2
AV Mean grad: 3.2 mmHg
AV Peak grad: 6.6 mmHg
Ao pk vel: 1.28 m/s
Area-P 1/2: 4.06 cm2
MV VTI: 2.75 cm2
S' Lateral: 2.6 cm

## 2023-03-21 NOTE — Progress Notes (Signed)
*  PRELIMINARY RESULTS* Echocardiogram 2D Echocardiogram has been performed.  Samuel Germany 03/21/2023, 11:20 AM

## 2023-04-03 ENCOUNTER — Telehealth: Payer: Self-pay

## 2023-04-03 NOTE — Telephone Encounter (Signed)
Pt has not been seen in the past year, she needs an appointment. Please call her and schedule a non urgent/next available appointment.

## 2023-04-03 NOTE — Telephone Encounter (Signed)
Patient left voicemail stating that she saw Dr. Amedeo Kinsman last year for her left foot. She is needing a special shoe for her left foot, please call and advise  [phone].

## 2023-04-10 ENCOUNTER — Encounter: Payer: Self-pay | Admitting: Orthopedic Surgery

## 2023-04-10 ENCOUNTER — Ambulatory Visit (INDEPENDENT_AMBULATORY_CARE_PROVIDER_SITE_OTHER): Payer: 59 | Admitting: Orthopedic Surgery

## 2023-04-10 ENCOUNTER — Other Ambulatory Visit (INDEPENDENT_AMBULATORY_CARE_PROVIDER_SITE_OTHER): Payer: 59

## 2023-04-10 VITALS — BP 145/92 | HR 86 | Ht 64.0 in | Wt 189.0 lb

## 2023-04-10 DIAGNOSIS — M2142 Flat foot [pes planus] (acquired), left foot: Secondary | ICD-10-CM

## 2023-04-10 DIAGNOSIS — S92315D Nondisplaced fracture of first metatarsal bone, left foot, subsequent encounter for fracture with routine healing: Secondary | ICD-10-CM

## 2023-04-10 DIAGNOSIS — S92335D Nondisplaced fracture of third metatarsal bone, left foot, subsequent encounter for fracture with routine healing: Secondary | ICD-10-CM

## 2023-04-10 DIAGNOSIS — S92322D Displaced fracture of second metatarsal bone, left foot, subsequent encounter for fracture with routine healing: Secondary | ICD-10-CM

## 2023-04-10 DIAGNOSIS — S92355D Nondisplaced fracture of fifth metatarsal bone, left foot, subsequent encounter for fracture with routine healing: Secondary | ICD-10-CM

## 2023-04-10 DIAGNOSIS — S92345D Nondisplaced fracture of fourth metatarsal bone, left foot, subsequent encounter for fracture with routine healing: Secondary | ICD-10-CM

## 2023-04-10 DIAGNOSIS — M2141 Flat foot [pes planus] (acquired), right foot: Secondary | ICD-10-CM

## 2023-04-11 ENCOUNTER — Encounter: Payer: Self-pay | Admitting: Orthopedic Surgery

## 2023-04-11 NOTE — Progress Notes (Signed)
Orthopaedic Postop Note  Assessment: Gopi Goodnow is a 82 y.o. female s/p ORIF of L 1st and 2nd metatarsal fracture; nonoperative management of L 3rd, 4th and 5th metatarsal shaft fractures   DOS: 02/27/2021  Plan: Mrs. Dohrman did well in her recovery from surgery to her left foot.  She does continue to have some discomfort, but is primarily interested in some assistance securing appropriate shoes.  She has severe bilateral flatfoot deformity.  Left is a little bit worse than the right.  Unsure left foot wear would be appropriate.  I will reach out to Dr. Lajoyce Corners to see if he can make some recommendations.  If he suggested referral, we will have her see Dr. Lajoyce Corners in follow-up.  Otherwise, she will follow-up as needed.  Will contact her with information for appropriate next steps.  Follow-up: Return if symptoms worsen or fail to improve. XR at next visit: L foot  Subjective:  Chief Complaint  Patient presents with   Foot Pain    Pt still having pain in her L foot after and ORIF DOS 02/27/21. Pt states she's been getting wide shoes and size bigger but still has significant pain in the foot. Trying to see what she can do to get some comfort in a real shoe.     History of Present Illness: Surah Fabbro is a 82 y.o. female who presents following the above stated procedure.  Surgery was over 2 years ago.  She recovered well.  I have not seen her in clinic in over a year.  She still has some pain in the left foot.  She has difficulty finding appropriate shoes.  She has tried to get to a larger size, but this is still causes her discomfort.  Pain is worse in the left foot today and it is in the right.  Review of Systems: No fevers or chills No numbness or tingling No Chest Pain No shortness of breath    Objective: BP (!) 145/92   Pulse 86   Ht 5\' 4"  (1.626 m)   Wt 189 lb (85.7 kg)   BMI 32.44 kg/m   Physical Exam:  Elderly female Alert and oriented, no acute distress. Ambulates with  the assistance of a cane.   Well-healed incisions to the left dorsal foot.  Plate is prominent over the medial midfoot.  She has a severe flatfoot deformity.  Left is slightly worse than the right.  However, she does have advanced flatfoot deformity on the right as well.  She is walking in slippers, which provide some stability.  No skin lesions or callosities.  IMAGING: I personally ordered and reviewed the following images:  X-rays left foot were obtained in clinic today.  No fractures to all metatarsals.  No change in the alignment of the hardware.  No screws are backing out.  No hardware failure.  There is a severe flatfoot deformity that is visible on radiographs.  No acute injuries.  No bony lesions.  Impression: Left foot x-rays with healed fractures to all metatarsals, without hardware failure; advanced flatfoot deformity Oliver Barre, MD 04/11/2023 11:34 AM

## 2023-04-15 ENCOUNTER — Telehealth: Payer: Self-pay | Admitting: Orthopedic Surgery

## 2023-04-15 NOTE — Telephone Encounter (Signed)
I called patient to advise to wear stiff soled shoe such as new balance, and if the foot does not improve to let us know and we can refer to Dr Lajoyce Corners she voiced understanding

## 2023-04-15 NOTE — Telephone Encounter (Signed)
Dr. Dallas Schimke pt - pt lvm stating that she saw Dr. Dallas Schimke last week and that he was going to check w/one of his colleagues in [place] about her foot.  She is wanting to know what he found out.  [phone]

## 2023-06-28 ENCOUNTER — Other Ambulatory Visit (HOSPITAL_COMMUNITY): Payer: Self-pay | Admitting: Family Medicine

## 2023-06-28 DIAGNOSIS — Z1231 Encounter for screening mammogram for malignant neoplasm of breast: Secondary | ICD-10-CM

## 2023-07-22 ENCOUNTER — Ambulatory Visit (HOSPITAL_COMMUNITY)
Admission: RE | Admit: 2023-07-22 | Discharge: 2023-07-22 | Disposition: A | Discharge status: Home / Self Care | Payer: 59 | Source: Ambulatory Visit | Attending: Family Medicine | Admitting: Family Medicine

## 2023-07-22 DIAGNOSIS — Z1231 Encounter for screening mammogram for malignant neoplasm of breast: Secondary | ICD-10-CM | POA: Insufficient documentation

## 2023-07-24 ENCOUNTER — Encounter (HOSPITAL_COMMUNITY): Payer: Self-pay | Admitting: Family Medicine

## 2023-07-24 ENCOUNTER — Encounter (HOSPITAL_COMMUNITY): Payer: Self-pay | Admitting: Nurse Practitioner

## 2023-08-14 ENCOUNTER — Other Ambulatory Visit (HOSPITAL_COMMUNITY): Payer: Self-pay | Admitting: Family Medicine

## 2023-08-14 DIAGNOSIS — R928 Other abnormal and inconclusive findings on diagnostic imaging of breast: Secondary | ICD-10-CM

## 2023-08-30 ENCOUNTER — Encounter: Payer: Self-pay | Admitting: Family Medicine

## 2023-08-30 ENCOUNTER — Ambulatory Visit (INDEPENDENT_AMBULATORY_CARE_PROVIDER_SITE_OTHER): Payer: 59 | Admitting: Family Medicine

## 2023-08-30 VITALS — BP 140/70 | HR 105 | Resp 96 | Ht 64.0 in | Wt 188.0 lb

## 2023-08-30 DIAGNOSIS — Z1329 Encounter for screening for other suspected endocrine disorder: Secondary | ICD-10-CM

## 2023-08-30 DIAGNOSIS — M25569 Pain in unspecified knee: Secondary | ICD-10-CM | POA: Insufficient documentation

## 2023-08-30 DIAGNOSIS — M069 Rheumatoid arthritis, unspecified: Secondary | ICD-10-CM

## 2023-08-30 DIAGNOSIS — G8929 Other chronic pain: Secondary | ICD-10-CM

## 2023-08-30 DIAGNOSIS — Z131 Encounter for screening for diabetes mellitus: Secondary | ICD-10-CM

## 2023-08-30 DIAGNOSIS — Z13220 Encounter for screening for lipoid disorders: Secondary | ICD-10-CM

## 2023-08-30 DIAGNOSIS — I10 Essential (primary) hypertension: Secondary | ICD-10-CM | POA: Diagnosis not present

## 2023-08-30 DIAGNOSIS — E538 Deficiency of other specified B group vitamins: Secondary | ICD-10-CM | POA: Diagnosis not present

## 2023-08-30 DIAGNOSIS — E559 Vitamin D deficiency, unspecified: Secondary | ICD-10-CM

## 2023-08-30 DIAGNOSIS — R0602 Shortness of breath: Secondary | ICD-10-CM

## 2023-08-30 DIAGNOSIS — M25561 Pain in right knee: Secondary | ICD-10-CM

## 2023-08-30 DIAGNOSIS — M25562 Pain in left knee: Secondary | ICD-10-CM

## 2023-08-30 NOTE — Assessment & Plan Note (Addendum)
Vitals:   08/30/23 1026 08/30/23 1121  BP: (!) 158/93 (!) 140/70  Initial visit Blood pressure not controlled in today's visit. Labs ordered Patient reports taking triamterene-hydrochlorothiazide 37.5-25 mg daily Follow up in 2 weeks to monitor trends If blood pressure still elevated next visit medication management will be provided.  Explained non pharmacological interventions such as low salt, DASH diet discussed. Educated on stress reduction and physical activity minimum 150 minutes per week. Discussed signs and symptoms of major cardiovascular event and need to present to the ED.  Patient verbalizes understanding regarding plan of care and all questions answered.

## 2023-08-30 NOTE — Patient Instructions (Signed)

## 2023-08-30 NOTE — Assessment & Plan Note (Signed)
Labs ordered awaiting results , if GFR optimal, will consider Mobic 7.5 mg PRN for pain management.  Explained to patient Non pharmacological interventions include the use of ice or heat, rest, recommend range of motion exercises, gentle stretching. Continue taking tylenol arthritis for pain management. Follow up for worsening or persistent symptoms. Patient verbalizes understanding regarding plan of care and all questions answered.  Advise patient to follow up with Orthopedics for worsening pain.

## 2023-08-30 NOTE — Progress Notes (Signed)
New Patient Office Visit   Subjective   Patient ID: Kim [name], female    DOB: Aug 28, 1941  Age: 82 y.o. MRN: [id number]  CC:  Chief Complaint  Patient presents with   Establish Care   Joint Swelling    Rt. Knee swelling with pain    HPI Kim [name] 82 year old female, presents to establish care. She  has a past medical history of Acid reflux, Arthritis, Back spasm, Carpal tunnel syndrome, Hypertension, and Rheumatoid arthritis (HCC).  Patient here for elevated blood pressure. She is not exercising and is not adherent to low salt diet.  Blood pressure unknown if controlled at home. Patient denies cardiac symptoms chest pain, chest pressure/discomfort, lower extremity edema, and palpitations. Patient reports dyspnea at times. Cardiovascular risk factors: advanced age, hypertension, obesity (BMI >= 30 kg/m2), and sedentary lifestyle.   Knee Pain  There was no injury mechanism. The pain is present in the right knee and left knee. The quality of the pain is described as aching. The pain is at a severity of 4/10. The pain has been Fluctuating since onset. Pertinent negatives include no inability to bear weight or numbness. She reports no foreign bodies present. The symptoms are aggravated by movement and palpation. She has tried acetaminophen, non-weight bearing and rest for the symptoms. The treatment provided moderate relief.       Outpatient Encounter Medications as of 08/30/2023  Medication Sig   acetaminophen (TYLENOL 8 HOUR ARTHRITIS PAIN) 650 MG CR tablet Take 650 mg by mouth every 8 (eight) hours as needed for pain.   albuterol (VENTOLIN HFA) 108 (90 Base) MCG/ACT inhaler Inhale 1-2 puffs into the lungs every 6 (six) hours as needed for wheezing or shortness of breath.   Cholecalciferol (VITAMIN D3) 50 MCG (2000 UT) capsule Take 2,000 Units by mouth daily.   furosemide (LASIX) 40 MG tablet Take 40 mg by mouth daily as needed.   Spacer/Aero-Holding Chambers (AEROCHAMBER PLUS)  inhaler Use with inhaler   triamcinolone cream (KENALOG) 0.5 % SMARTSIG:Sparingly Topical 3 Times Daily   triamterene-hydrochlorothiazide (MAXZIDE-25) 37.5-25 MG tablet Take 1 tablet by mouth daily.    [DISCONTINUED] fluticasone (FLONASE) 50 MCG/ACT nasal spray Place 2 sprays into both nostrils daily. (Patient not taking: Reported on 08/30/2023)   No facility-administered encounter medications on file as of 08/30/2023.    Past Surgical History:  Procedure Laterality Date   KNEE SURGERY     ORIF TOE FRACTURE Left 02/27/2021   Procedure: OPEN REDUCTION INTERNAL FIXATION (ORIF) METATARSAL (TOE) FRACTURE;  Surgeon: Oliver Barre, MD;  Location: [facility];  Service: Orthopedics;  Laterality: Left;  ORIF of 1st and 2nd metatarsal fractures   TUBAL LIGATION      Review of Systems  Constitutional:  Negative for chills and fever.  Eyes:  Positive for blurred vision.  Respiratory:  Positive for shortness of breath.   Cardiovascular:  Negative for chest pain.  Gastrointestinal:  Negative for abdominal pain.  Genitourinary:  Negative for dysuria.  Musculoskeletal:  Positive for myalgias.  Neurological:  Negative for dizziness, numbness and headaches.      Objective    BP (!) 140/70 (BP Location: Left Arm, Patient Position: Sitting, Cuff Size: Normal)   Pulse (!) 105   Resp (!) 96   Ht 5\' 4"  (1.626 m)   Wt 188 lb (85.3 kg)   BMI 32.27 kg/m   Physical Exam Vitals reviewed.  Constitutional:      General: She is not in acute distress.  Appearance: Normal appearance. She is not ill-appearing, toxic-appearing or diaphoretic.  HENT:     Head: Normocephalic.  Eyes:     General:        Right eye: No discharge.        Left eye: No discharge.     Conjunctiva/sclera: Conjunctivae normal.  Cardiovascular:     Rate and Rhythm: Normal rate.     Pulses: Normal pulses.     Heart sounds: Normal heart sounds.  Pulmonary:     Effort: Pulmonary effort is normal. No respiratory distress.     Breath  sounds: Normal breath sounds.  Abdominal:     General: Bowel sounds are normal.     Palpations: Abdomen is soft.     Tenderness: There is no abdominal tenderness. There is no right CVA tenderness, left CVA tenderness or guarding.  Musculoskeletal:     Cervical back: Normal range of motion.     Right knee: Decreased range of motion. Tenderness present.     Left knee: Decreased range of motion. Tenderness present.  Skin:    General: Skin is warm and dry.     Capillary Refill: Capillary refill takes less than 2 seconds.  Neurological:     General: No focal deficit present.     Mental Status: She is alert and oriented to person, place, and time.     Coordination: Coordination abnormal.     Gait: Gait abnormal.  Psychiatric:        Mood and Affect: Mood normal.        Behavior: Behavior normal.       Assessment & Plan:  Screening for thyroid disorder -     TSH + free T4  Vitamin B12 deficiency -     B12 and Folate Panel  Primary hypertension Assessment & Plan: Vitals:   08/30/23 1026 08/30/23 1121  BP: (!) 158/93 (!) 140/70  Initial visit Blood pressure not controlled in today's visit. Labs ordered Patient reports taking triamterene-hydrochlorothiazide 37.5-25 mg daily Follow up in 2 weeks to monitor trends If blood pressure still elevated next visit medication management will be provided.  Explained non pharmacological interventions such as low salt, DASH diet discussed. Educated on stress reduction and physical activity minimum 150 minutes per week. Discussed signs and symptoms of major cardiovascular event and need to present to the ED.  Patient verbalizes understanding regarding plan of care and all questions answered.   Orders: -     CMP14+EGFR -     CBC with Differential/Platelet  Screening for diabetes mellitus -     Microalbumin / creatinine urine ratio -     Hemoglobin A1c  Screening for lipid disorders -     Lipid panel  Vitamin D deficiency -     VITAMIN  D 25 Hydroxy (Vit-D Deficiency, Fractures)  SOB (shortness of breath) -     Brain natriuretic peptide  Rheumatoid arthritis, involving unspecified site, unspecified whether rheumatoid factor present (HCC) -     ANA w/Reflex -     Rheumatoid factor  Chronic pain of both knees Assessment & Plan: Labs ordered awaiting results , if GFR optimal, will consider Mobic 7.5 mg PRN for pain management.  Explained to patient Non pharmacological interventions include the use of ice or heat, rest, recommend range of motion exercises, gentle stretching. Continue taking tylenol arthritis for pain management. Follow up for worsening or persistent symptoms. Patient verbalizes understanding regarding plan of care and all questions answered.  Advise patient to follow  up with Orthopedics for worsening pain.     Return in about 2 weeks (around 09/13/2023), or if symptoms worsen or fail to improve, for re-check blood pressure and 3 month chronic follow up, re-check blood pressure.   Cruzita Lederer Newman Nip, FNP

## 2023-09-08 LAB — LIPID PANEL
Chol/HDL Ratio: 4.1 ratio (ref 0.0–4.4)
Cholesterol, Total: 217 mg/dL — ABNORMAL HIGH (ref 100–199)
HDL: 53 mg/dL (ref >39)
LDL Chol Calc (NIH): 145 mg/dL — ABNORMAL HIGH (ref 0–99)
Triglycerides: 105 mg/dL (ref 0–149)
VLDL Cholesterol Cal: 19 mg/dL (ref 5–40)

## 2023-09-08 LAB — MICROALBUMIN / CREATININE URINE RATIO
Creatinine, Urine: 92.6 mg/dL
Microalb/Creat Ratio: 55 mg/g{creat} — ABNORMAL HIGH (ref 0–29)
Microalbumin, Urine: 50.7 ug/mL

## 2023-09-08 LAB — CMP14+EGFR
ALT: 9 IU/L (ref 0–32)
AST: 22 IU/L (ref 0–40)
Albumin: 4.2 g/dL (ref 3.7–4.7)
Alkaline Phosphatase: 88 IU/L (ref 44–121)
BUN/Creatinine Ratio: 24 (ref 12–28)
BUN: 21 mg/dL (ref 8–27)
Bilirubin Total: 0.2 mg/dL (ref 0.0–1.2)
CO2: 25 mmol/L (ref 20–29)
Calcium: 9.4 mg/dL (ref 8.7–10.3)
Chloride: 100 mmol/L (ref 96–106)
Creatinine, Ser: 0.88 mg/dL (ref 0.57–1.00)
Globulin, Total: 3.5 g/dL (ref 1.5–4.5)
Glucose: 89 mg/dL (ref 70–99)
Potassium: 4.2 mmol/L (ref 3.5–5.2)
Sodium: 136 mmol/L (ref 134–144)
Total Protein: 7.7 g/dL (ref 6.0–8.5)
eGFR: 66 mL/min/{1.73_m2} (ref >59)

## 2023-09-08 LAB — B12 AND FOLATE PANEL
Folate: 8.7 ng/mL (ref >3.0)
Vitamin B-12: 360 pg/mL (ref 232–1245)

## 2023-09-08 LAB — CBC WITH DIFFERENTIAL/PLATELET
Basophils Absolute: 0.1 10*3/uL (ref 0.0–0.2)
Basos: 1 %
EOS (ABSOLUTE): 0.2 10*3/uL (ref 0.0–0.4)
Eos: 3 %
Hematocrit: 45.6 % (ref 34.0–46.6)
Hemoglobin: 14.4 g/dL (ref 11.1–15.9)
Immature Grans (Abs): 0 10*3/uL (ref 0.0–0.1)
Immature Granulocytes: 0 %
Lymphocytes Absolute: 2.6 10*3/uL (ref 0.7–3.1)
Lymphs: 45 %
MCH: 28.5 pg (ref 26.6–33.0)
MCHC: 31.6 g/dL (ref 31.5–35.7)
MCV: 90 fL (ref 79–97)
Monocytes Absolute: 0.4 10*3/uL (ref 0.1–0.9)
Monocytes: 7 %
Neutrophils Absolute: 2.6 10*3/uL (ref 1.4–7.0)
Neutrophils: 44 %
Platelets: 245 10*3/uL (ref 150–450)
RBC: 5.06 x10E6/uL (ref 3.77–5.28)
RDW: 12.9 % (ref 11.7–15.4)
WBC: 5.9 10*3/uL (ref 3.4–10.8)

## 2023-09-08 LAB — BRAIN NATRIURETIC PEPTIDE: BNP: 43.8 pg/mL (ref 0.0–100.0)

## 2023-09-08 LAB — TSH+FREE T4
Free T4: 1.5 ng/dL (ref 0.82–1.77)
TSH: 1.23 u[IU]/mL (ref 0.450–4.500)

## 2023-09-08 LAB — VITAMIN D 25 HYDROXY (VIT D DEFICIENCY, FRACTURES): Vit D, 25-Hydroxy: 56.9 ng/mL (ref 30.0–100.0)

## 2023-09-08 LAB — HEMOGLOBIN A1C
Est. average glucose Bld gHb Est-mCnc: 140 mg/dL
Hgb A1c MFr Bld: 6.5 % — ABNORMAL HIGH (ref 4.8–5.6)

## 2023-09-09 LAB — ENA+DNA/DS+SJORGEN'S
ENA RNP Ab: 1.3 AI — ABNORMAL HIGH (ref 0.0–0.9)
ENA SM Ab Ser-aCnc: <0.2 AI (ref 0.0–0.9)
ENA SSA (RO) Ab: 0.6 AI (ref 0.0–0.9)
ENA SSB (LA) Ab: <0.2 AI (ref 0.0–0.9)
dsDNA Ab: <1 [IU]/mL (ref 0–9)

## 2023-09-09 LAB — RHEUMATOID FACTOR: Rheumatoid fact SerPl-aCnc: 116.7 [IU]/mL — ABNORMAL HIGH (ref <14.0)

## 2023-09-09 LAB — ANA W/REFLEX: Anti Nuclear Antibody (ANA): POSITIVE — AB

## 2023-09-10 ENCOUNTER — Ambulatory Visit (HOSPITAL_COMMUNITY)
Admission: RE | Admit: 2023-09-10 | Discharge: 2023-09-10 | Disposition: A | Discharge status: Home / Self Care | Payer: 59 | Source: Ambulatory Visit | Attending: Family Medicine | Admitting: Family Medicine

## 2023-09-10 DIAGNOSIS — R928 Other abnormal and inconclusive findings on diagnostic imaging of breast: Secondary | ICD-10-CM | POA: Insufficient documentation

## 2023-09-11 ENCOUNTER — Ambulatory Visit (INDEPENDENT_AMBULATORY_CARE_PROVIDER_SITE_OTHER): Payer: 59 | Admitting: Orthopedic Surgery

## 2023-09-11 ENCOUNTER — Other Ambulatory Visit: Payer: Self-pay | Admitting: Orthopedic Surgery

## 2023-09-11 ENCOUNTER — Other Ambulatory Visit: Payer: Self-pay

## 2023-09-11 ENCOUNTER — Other Ambulatory Visit (INDEPENDENT_AMBULATORY_CARE_PROVIDER_SITE_OTHER): Payer: 59

## 2023-09-11 ENCOUNTER — Encounter: Payer: Self-pay | Admitting: Orthopedic Surgery

## 2023-09-11 VITALS — BP 127/77 | HR 87

## 2023-09-11 DIAGNOSIS — G8929 Other chronic pain: Secondary | ICD-10-CM

## 2023-09-11 DIAGNOSIS — M25562 Pain in left knee: Secondary | ICD-10-CM | POA: Diagnosis not present

## 2023-09-11 DIAGNOSIS — M170 Bilateral primary osteoarthritis of knee: Secondary | ICD-10-CM

## 2023-09-11 DIAGNOSIS — M25561 Pain in right knee: Secondary | ICD-10-CM | POA: Diagnosis not present

## 2023-09-11 NOTE — Progress Notes (Signed)
Orthopaedic Clinic Return  Assessment: Kim [name] is a 82 y.o. female with the following: Bilateral knee arthritis, right worse than left   Plan: Mrs. Tonini has bilateral knee pain.  Right is worse than the left.  This has been progressively worsening.  Radiographs demonstrates severe degenerative changes in the right knee, with advanced disease in the lateral and patellofemoral compartments.  Left knee with less severe degenerative changes, but advanced changes in the patellofemoral joint.  I think she would benefit from a knee injection.  She is interested in an injection of the right knee only.  If she would like a left knee injection, she will return to clinic in the future.  Procedure note injection Right knee joint   Verbal consent was obtained to inject the right knee joint  Timeout was completed to confirm the site of injection.  The skin was prepped with alcohol and ethyl chloride was sprayed at the injection site.  A 21-gauge needle was used to inject 40 mg of Depo-Medrol and 1% lidocaine (4 cc) into the right knee using an anterolateral approach.  There were no complications. A sterile bandage was applied.   Follow-up: Return if symptoms worsen or fail to improve.   Subjective:  Chief Complaint  Patient presents with   Knee Pain    Bilat knee pain R > L getting worse over the past 2 wks.     History of Present Illness: Angelyse Memmott is a 82 y.o. female who returns to clinic for evaluation of bilateral knee pain.  She is well-known to my clinic.  I last saw her for left foot pain.  She is wearing stable shoes.  She is doing better overall.  She notes progressively worsening pain of bilateral knees.  Right is worse than left.  She is taking Tylenol arthritis.  No prior injections.  She has had aspirations in the past.  Pain is diffuse, but primarily within the lateral aspect of the right knee.  Review of Systems: No fevers or chills No numbness or tingling No chest  pain No shortness of breath No bowel or bladder dysfunction No GI distress No headaches   Objective: BP 127/77   Pulse 87   Physical Exam:  Right knee with a mild effusion.  Valgus alignment overall.  Tenderness palpation over the lateral joint line.  She is able to achieve full extension, with flexion beyond 100 degrees.  Positive crepitus on range of motion testing.  Left knee with minimal effusion.  Alignment.  Tenderness to palpation over the medial lateral joint line.  Full extension to flexion beyond 100 degrees.  Crepitus with range of motion testing.  IMAGING: I personally ordered and reviewed the following images:   X-rays of bilateral knees were obtained in clinic today.  Valgus alignment overall.  In the right knee, there are advanced degenerative changes, specifically within the lateral and patellofemoral compartments.  Complete loss of joint space within the lateral compartment.  Large osteophytes, subchondral sclerosis.  Within the left knee, degenerative changes are minimal within the medial lateral compartments.  Advanced degenerative changes within the left patellofemoral.  Impression: Severe right knee arthritis, with moderate left knee arthritis   Oliver Barre, MD 09/11/2023 10:54 AM

## 2023-09-11 NOTE — Patient Instructions (Signed)

## 2023-09-17 ENCOUNTER — Other Ambulatory Visit: Payer: Self-pay | Admitting: Family Medicine

## 2023-09-17 DIAGNOSIS — R768 Other specified abnormal immunological findings in serum: Secondary | ICD-10-CM

## 2023-09-17 MED ORDER — ROSUVASTATIN CALCIUM 10 MG PO TABS: 10.0000 mg | ORAL_TABLET | Freq: Every day | ORAL | 3 refills | Status: DC

## 2023-09-17 MED ORDER — METFORMIN HCL 500 MG PO TABS: 500.0000 mg | ORAL_TABLET | Freq: Every day | ORAL | 1 refills | Status: DC

## 2023-09-17 NOTE — Progress Notes (Signed)
Please inform patient, referral to Rheumatology, Lab positive for Rheumatoid factor. Mail lab results to patient.  Hemoglobin A1c 6.5 indicates Type 2 diabetes ,is a chronic condition where the body becomes resistant to insulin or doesn't produce enough insulin, leading to elevated blood sugar levels. It often develops due to a combination of genetic and lifestyle factors, such as obesity and inactivity.  Normally, your body uses a hormone called insulin to help sugar move from your blood into your cells. But when you have Type 2 diabetes, your body isn't able to use insulin properly, or it doesn't make enough of it. This causes sugar to build up in your blood instead of being used by your cells for energy.    Plan of treatment will include Metformin 500 mg once daily- Medication sent to pharmacy     Cholesterol levels elevated, I advise lifestyle modifications follow diet low in saturated fat, reduce dietary salt intake, avoid fatty foods, maintain an exercise routine 3 to 5 days a week for a minimum total of 150 minutes.   Plan of treatment will include Crestor 10 mg once daily-  Medication sent to pharmacy

## 2023-09-18 ENCOUNTER — Telehealth: Payer: Self-pay | Admitting: Family Medicine

## 2023-09-18 ENCOUNTER — Encounter: Payer: Self-pay | Admitting: Family Medicine

## 2023-09-18 NOTE — Telephone Encounter (Signed)
[facility] called in on patient behalf   Patient is confused on why referral has been placed. Is thinking it should be for daughter .  Rheumatology will keep referral open but is asking provider to reach out to patient with a little explanation on why placed  , also asking to place info in referral so office can know hen to reach out to patient and schedule

## 2023-09-18 NOTE — Telephone Encounter (Signed)
Incoming call from patient says she is returning a call

## 2023-09-19 NOTE — Telephone Encounter (Signed)
Spoke with grandaughter and she will call back out to rheumatology to schedule. Was driving so I mailed the rest of her results to them so they will have it to read and ask questions if needed

## 2023-09-19 NOTE — Telephone Encounter (Signed)
Called pt back no option to leave voicemail and no answer

## 2023-09-23 ENCOUNTER — Ambulatory Visit: Payer: 59 | Admitting: Family Medicine

## 2023-09-23 VITALS — BP 129/80 | HR 97 | Resp 16 | Ht 64.0 in | Wt 186.8 lb

## 2023-09-23 DIAGNOSIS — R059 Cough, unspecified: Secondary | ICD-10-CM | POA: Insufficient documentation

## 2023-09-23 DIAGNOSIS — R051 Acute cough: Secondary | ICD-10-CM

## 2023-09-23 DIAGNOSIS — I10 Essential (primary) hypertension: Secondary | ICD-10-CM | POA: Diagnosis not present

## 2023-09-23 MED ORDER — METFORMIN HCL 500 MG PO TABS: 500.0000 mg | ORAL_TABLET | Freq: Every day | ORAL | 1 refills | Status: DC

## 2023-09-23 MED ORDER — ROSUVASTATIN CALCIUM 10 MG PO TABS: 10.0000 mg | ORAL_TABLET | Freq: Every day | ORAL | 3 refills | Status: DC

## 2023-09-23 MED ORDER — MELOXICAM 7.5 MG PO TABS: 7.5000 mg | ORAL_TABLET | Freq: Once | ORAL | 2 refills | Status: DC | PRN

## 2023-09-23 MED ORDER — ALBUTEROL SULFATE HFA 108 (90 BASE) MCG/ACT IN AERS: 1.0000 | INHALATION_SPRAY | Freq: Four times a day (QID) | RESPIRATORY_TRACT | 6 refills | Status: DC | PRN

## 2023-09-23 MED ORDER — MELOXICAM 7.5 MG PO TABS: 7.5000 mg | ORAL_TABLET | Freq: Every day | ORAL | 0 refills | Status: DC

## 2023-09-23 MED ORDER — BENZONATATE 100 MG PO CAPS: 100.0000 mg | ORAL_CAPSULE | Freq: Two times a day (BID) | ORAL | 0 refills | Status: DC | PRN

## 2023-09-23 MED ORDER — TRIAMTERENE-HCTZ 37.5-25 MG PO TABS: 1.0000 | ORAL_TABLET | Freq: Every day | ORAL | 1 refills | Status: DC

## 2023-09-23 MED ORDER — PREDNISONE 20 MG PO TABS: 20.0000 mg | ORAL_TABLET | Freq: Two times a day (BID) | ORAL | 0 refills | Status: AC

## 2023-09-23 NOTE — Assessment & Plan Note (Addendum)
Benzonatate 100 mg PRN for cough Prednisone 20 mg twice daily x 5 days Refilled Albuterol inhaler Advise Symptomatic treatment, rest, increase oral fluid intake. Take OTC tylenol for fever or pain medications. Follow-up for worsening or persistent symptoms. Patient verbalizes understanding regarding plan of care and all questions answered

## 2023-09-23 NOTE — Progress Notes (Signed)
Patient Office Visit   Subjective   Patient ID: Kim [name], female    DOB: 09/12/1941  Age: 82 y.o. MRN: [id number]  CC:  Chief Complaint  Patient presents with   Hypertension    HPI Kim [name] presents to the clinic for HTN follow up and new onset of cough started one week ago. She  has a past medical history of Acid reflux, Arthritis, Back spasm, Carpal tunnel syndrome, Hypertension, and Rheumatoid arthritis (HCC).For the details of today's visit, please refer to assessment and plan.   Cough This is a new problem. The current episode started 1 to 4 weeks ago. The problem has been waxing and waning. The problem occurs every few hours. The cough is Non-productive. Associated symptoms include nasal congestion and shortness of breath. Pertinent negatives include no chest pain, chills, ear pain, fever, headaches, hemoptysis, sore throat, sweats or wheezing. The symptoms are aggravated by cold air and dust. She has tried a beta-agonist inhaler and rest for the symptoms. The treatment provided mild relief. Her past medical history is significant for asthma and environmental allergies.      Outpatient Encounter Medications as of 09/23/2023  Medication Sig   acetaminophen (TYLENOL 8 HOUR ARTHRITIS PAIN) 650 MG CR tablet Take 650 mg by mouth every 8 (eight) hours as needed for pain.   benzonatate (TESSALON) 100 MG capsule Take 1 capsule (100 mg total) by mouth 2 (two) times daily as needed for cough.   Cholecalciferol (VITAMIN D3) 50 MCG (2000 UT) capsule Take 2,000 Units by mouth daily.   furosemide (LASIX) 40 MG tablet Take 40 mg by mouth daily as needed.   predniSONE (DELTASONE) 20 MG tablet Take 1 tablet (20 mg total) by mouth 2 (two) times daily with a meal for 5 days.   triamcinolone cream (KENALOG) 0.5 % SMARTSIG:Sparingly Topical 3 Times Daily   [DISCONTINUED] albuterol (VENTOLIN HFA) 108 (90 Base) MCG/ACT inhaler Inhale 1-2 puffs into the lungs every 6 (six) hours as needed  for wheezing or shortness of breath.   [DISCONTINUED] meloxicam (MOBIC) 7.5 MG tablet Take 1 tablet (7.5 mg total) by mouth daily.   [DISCONTINUED] triamterene-hydrochlorothiazide (MAXZIDE-25) 37.5-25 MG tablet Take 1 tablet by mouth daily.    albuterol (VENTOLIN HFA) 108 (90 Base) MCG/ACT inhaler Inhale 1-2 puffs into the lungs every 6 (six) hours as needed for wheezing or shortness of breath.   meloxicam (MOBIC) 7.5 MG tablet Take 1 tablet (7.5 mg total) by mouth once as needed for up to 1 dose for pain.   metFORMIN (GLUCOPHAGE) 500 MG tablet Take 1 tablet (500 mg total) by mouth daily with breakfast.   rosuvastatin (CRESTOR) 10 MG tablet Take 1 tablet (10 mg total) by mouth daily.   Spacer/Aero-Holding Chambers (AEROCHAMBER PLUS) inhaler Use with inhaler   triamterene-hydrochlorothiazide (MAXZIDE-25) 37.5-25 MG tablet Take 1 tablet by mouth daily.   [DISCONTINUED] albuterol (VENTOLIN HFA) 108 (90 Base) MCG/ACT inhaler Inhale 1-2 puffs into the lungs every 6 (six) hours as needed for wheezing or shortness of breath.   [DISCONTINUED] metFORMIN (GLUCOPHAGE) 500 MG tablet Take 1 tablet (500 mg total) by mouth daily with breakfast. (Patient not taking: Reported on 09/23/2023)   [DISCONTINUED] rosuvastatin (CRESTOR) 10 MG tablet Take 1 tablet (10 mg total) by mouth daily. (Patient not taking: Reported on 09/23/2023)   No facility-administered encounter medications on file as of 09/23/2023.    Past Surgical History:  Procedure Laterality Date   KNEE SURGERY     ORIF TOE FRACTURE  Left 02/27/2021   Procedure: OPEN REDUCTION INTERNAL FIXATION (ORIF) METATARSAL (TOE) FRACTURE;  Surgeon: Oliver Barre, MD;  Location: [facility];  Service: Orthopedics;  Laterality: Left;  ORIF of 1st and 2nd metatarsal fractures   TUBAL LIGATION      Review of Systems  Constitutional:  Negative for chills and fever.  HENT:  Negative for ear pain and sore throat.   Eyes:  Negative for blurred vision.  Respiratory:   Positive for cough and shortness of breath. Negative for hemoptysis, sputum production and wheezing.   Cardiovascular:  Negative for chest pain.  Gastrointestinal:  Negative for abdominal pain.  Genitourinary:  Negative for dysuria.  Neurological:  Negative for dizziness and headaches.  Endo/Heme/Allergies:  Positive for environmental allergies.      Objective    BP 129/80   Pulse 97   Resp 16   Ht 5\' 4"  (1.626 m)   Wt 186 lb 12 oz (84.7 kg)   SpO2 95%   BMI 32.06 kg/m   Physical Exam Vitals reviewed.  Constitutional:      General: She is not in acute distress.    Appearance: Normal appearance. She is not ill-appearing, toxic-appearing or diaphoretic.  HENT:     Head: Normocephalic.  Eyes:     General:        Right eye: No discharge.        Left eye: No discharge.     Conjunctiva/sclera: Conjunctivae normal.  Cardiovascular:     Rate and Rhythm: Normal rate.     Pulses: Normal pulses.     Heart sounds: Normal heart sounds.  Pulmonary:     Effort: No respiratory distress.     Breath sounds: Rhonchi present.  Abdominal:     General: Bowel sounds are normal.     Palpations: Abdomen is soft.     Tenderness: There is no abdominal tenderness. There is no right CVA tenderness, left CVA tenderness or guarding.  Musculoskeletal:     Cervical back: Normal range of motion.  Skin:    General: Skin is warm and dry.     Capillary Refill: Capillary refill takes less than 2 seconds.  Neurological:     Mental Status: She is alert and oriented to person, place, and time.  Psychiatric:        Mood and Affect: Mood normal.        Behavior: Behavior normal.       Assessment & Plan:  Acute cough Assessment & Plan: Benzonatate 100 mg PRN for cough Prednisone 20 mg twice daily x 5 days Refilled Albuterol inhaler Advise Symptomatic treatment, rest, increase oral fluid intake. Take OTC tylenol for fever or pain medications. Follow-up for worsening or persistent symptoms. Patient  verbalizes understanding regarding plan of care and all questions answered    Primary hypertension Assessment & Plan: Vitals:   09/23/23 1045  BP: 129/80  Blood pressure controlled in today's visit Continue  Triamterene-hydrochlorothiazide 37.5-25 once daily Continued discussion on DASH diet, low sodium diet and maintain a exercise routine for 150 minutes per week.    Other orders -     Meloxicam; Take 1 tablet (7.5 mg total) by mouth once as needed for up to 1 dose for pain.  Dispense: 30 tablet; Refill: 2 -     metFORMIN HCl; Take 1 tablet (500 mg total) by mouth daily with breakfast.  Dispense: 90 tablet; Refill: 1 -     Rosuvastatin Calcium; Take 1 tablet (10 mg total) by mouth  daily.  Dispense: 90 tablet; Refill: 3 -     Benzonatate; Take 1 capsule (100 mg total) by mouth 2 (two) times daily as needed for cough.  Dispense: 20 capsule; Refill: 0 -     predniSONE; Take 1 tablet (20 mg total) by mouth 2 (two) times daily with a meal for 5 days.  Dispense: 10 tablet; Refill: 0 -     Triamterene-HCTZ; Take 1 tablet by mouth daily.  Dispense: 90 tablet; Refill: 1 -     Albuterol Sulfate HFA; Inhale 1-2 puffs into the lungs every 6 (six) hours as needed for wheezing or shortness of breath.  Dispense: 1 each; Refill: 6    Return in about 3 months (around 12/23/2023), or if symptoms worsen or fail to improve, for type 2 diabetes, hypertension.   Cruzita Lederer Newman Nip, FNP

## 2023-09-23 NOTE — Patient Instructions (Addendum)
        Great to see you today.  I have refilled the medication(s) we provide.    - Please take medications as prescribed. - Follow up with your primary health provider if any health concerns arises. - If symptoms worsen please contact your primary care provider and/or visit the emergency department.  

## 2023-09-23 NOTE — Assessment & Plan Note (Signed)
Vitals:   09/23/23 1045  BP: 129/80  Blood pressure controlled in today's visit Continue  Triamterene-hydrochlorothiazide 37.5-25 once daily Continued discussion on DASH diet, low sodium diet and maintain a exercise routine for 150 minutes per week.

## 2023-10-24 ENCOUNTER — Telehealth: Payer: Self-pay | Admitting: Family Medicine

## 2023-10-24 NOTE — Telephone Encounter (Signed)
Patient called asked if she can get her flu shot along with all the medications she is on call back # [phone].

## 2023-10-29 ENCOUNTER — Ambulatory Visit: Payer: 59

## 2023-10-29 NOTE — Progress Notes (Unsigned)
  Per patient she has completed with [facility] - L.Wilson,LPN

## 2023-12-19 ENCOUNTER — Emergency Department (HOSPITAL_COMMUNITY): Payer: 59

## 2023-12-19 ENCOUNTER — Ambulatory Visit: Payer: 59 | Admitting: Family Medicine

## 2023-12-19 ENCOUNTER — Encounter (HOSPITAL_COMMUNITY): Payer: Self-pay | Admitting: Emergency Medicine

## 2023-12-19 ENCOUNTER — Emergency Department (HOSPITAL_COMMUNITY)
Admission: EM | Admit: 2023-12-19 | Discharge: 2023-12-19 | Disposition: A | Discharge status: Against Medical Advice | Payer: 59 | Attending: Emergency Medicine | Admitting: Emergency Medicine

## 2023-12-19 ENCOUNTER — Other Ambulatory Visit: Payer: Self-pay

## 2023-12-19 DIAGNOSIS — R109 Unspecified abdominal pain: Secondary | ICD-10-CM | POA: Insufficient documentation

## 2023-12-19 DIAGNOSIS — R509 Fever, unspecified: Secondary | ICD-10-CM | POA: Insufficient documentation

## 2023-12-19 DIAGNOSIS — R0602 Shortness of breath: Secondary | ICD-10-CM | POA: Insufficient documentation

## 2023-12-19 DIAGNOSIS — Z1152 Encounter for screening for COVID-19: Secondary | ICD-10-CM | POA: Diagnosis not present

## 2023-12-19 DIAGNOSIS — R110 Nausea: Secondary | ICD-10-CM | POA: Insufficient documentation

## 2023-12-19 DIAGNOSIS — Z79899 Other long term (current) drug therapy: Secondary | ICD-10-CM | POA: Insufficient documentation

## 2023-12-19 DIAGNOSIS — I10 Essential (primary) hypertension: Secondary | ICD-10-CM | POA: Insufficient documentation

## 2023-12-19 LAB — URINALYSIS, W/ REFLEX TO CULTURE (INFECTION SUSPECTED)
Bilirubin Urine: NEGATIVE
Glucose, UA: NEGATIVE mg/dL
Hgb urine dipstick: NEGATIVE
Ketones, ur: NEGATIVE mg/dL
Leukocytes,Ua: NEGATIVE
Nitrite: NEGATIVE
Protein, ur: NEGATIVE mg/dL
Specific Gravity, Urine: 1.016 (ref 1.005–1.030)
pH: 6 (ref 5.0–8.0)

## 2023-12-19 LAB — COMPREHENSIVE METABOLIC PANEL
ALT: 9 U/L (ref 0–44)
AST: 19 U/L (ref 15–41)
Albumin: 3.7 g/dL (ref 3.5–5.0)
Alkaline Phosphatase: 53 U/L (ref 38–126)
Anion gap: 8 (ref 5–15)
BUN: 18 mg/dL (ref 8–23)
CO2: 25 mmol/L (ref 22–32)
Calcium: 9.2 mg/dL (ref 8.9–10.3)
Chloride: 100 mmol/L (ref 98–111)
Creatinine, Ser: 0.81 mg/dL (ref 0.44–1.00)
GFR, Estimated: >60 mL/min (ref >60)
Glucose, Bld: 111 mg/dL — ABNORMAL HIGH (ref 70–99)
Potassium: 3.3 mmol/L — ABNORMAL LOW (ref 3.5–5.1)
Sodium: 133 mmol/L — ABNORMAL LOW (ref 135–145)
Total Bilirubin: 0.7 mg/dL (ref <1.2)
Total Protein: 7.4 g/dL (ref 6.5–8.1)

## 2023-12-19 LAB — CBC WITH DIFFERENTIAL/PLATELET
Abs Immature Granulocytes: 0.16 10*3/uL — ABNORMAL HIGH (ref 0.00–0.07)
Basophils Absolute: 0.1 10*3/uL (ref 0.0–0.1)
Basophils Relative: 1 %
Eosinophils Absolute: 0 10*3/uL (ref 0.0–0.5)
Eosinophils Relative: 0 %
HCT: 43.2 % (ref 36.0–46.0)
Hemoglobin: 13.8 g/dL (ref 12.0–15.0)
Immature Granulocytes: 2 %
Lymphocytes Relative: 11 %
Lymphs Abs: 1 10*3/uL (ref 0.7–4.0)
MCH: 28.5 pg (ref 26.0–34.0)
MCHC: 31.9 g/dL (ref 30.0–36.0)
MCV: 89.1 fL (ref 80.0–100.0)
Monocytes Absolute: 0.8 10*3/uL (ref 0.1–1.0)
Monocytes Relative: 8 %
Neutro Abs: 7.7 10*3/uL (ref 1.7–7.7)
Neutrophils Relative %: 78 %
Platelets: 201 10*3/uL (ref 150–400)
RBC: 4.85 MIL/uL (ref 3.87–5.11)
RDW: 13.8 % (ref 11.5–15.5)
WBC: 9.8 10*3/uL (ref 4.0–10.5)
nRBC: 0 % (ref 0.0–0.2)

## 2023-12-19 LAB — RESP PANEL BY RT-PCR (RSV, FLU A&B, COVID)  RVPGX2
Influenza A by PCR: NEGATIVE
Influenza B by PCR: NEGATIVE
Resp Syncytial Virus by PCR: NEGATIVE
SARS Coronavirus 2 by RT PCR: NEGATIVE

## 2023-12-19 LAB — LACTIC ACID, PLASMA
Lactic Acid, Venous: 1 mmol/L (ref 0.5–1.9)
Lactic Acid, Venous: 1.1 mmol/L (ref 0.5–1.9)

## 2023-12-19 MED ORDER — ACETAMINOPHEN 325 MG PO TABS
650.0000 mg | ORAL_TABLET | Freq: Once | ORAL | Status: AC
  Administered 2023-12-19: 650 mg via ORAL
  Filled 2023-12-19: qty 2

## 2023-12-19 MED ORDER — SODIUM CHLORIDE 0.9 % IV BOLUS
500.0000 mL | Freq: Once | INTRAVENOUS | Status: AC
Start: 2023-12-19 — End: 2023-12-19
  Administered 2023-12-19: 500 mL via INTRAVENOUS

## 2023-12-19 MED ORDER — IOHEXOL 300 MG/ML  SOLN
75.0000 mL | Freq: Once | INTRAMUSCULAR | Status: AC | PRN
  Administered 2023-12-19: 75 mL via INTRAVENOUS

## 2023-12-19 NOTE — ED Notes (Signed)
Female PW placed on pt due to ambulatory status and being unable to use bedpan.  RN aware.  Pt repositioned.

## 2023-12-19 NOTE — ED Triage Notes (Signed)
Pt arrived via [facility] code sepsis called in field, from home, pt went to UC today where she was Dx with covid and UTI. Pt has been feeling shob x few weeks now. Pt states nauseous x today. Temperature 102.1 F. A&Ox 4. Cbg 119. Hx T2D

## 2023-12-19 NOTE — ED Notes (Signed)
Airborne precautions placed on door as pt tested positive for covid at Urgent Care

## 2023-12-19 NOTE — ED Provider Notes (Addendum)
EMERGENCY DEPARTMENT AT [facility] Provider Note   CSN: [id number] Arrival date & time: 12/19/23  1552     History  Chief Complaint  Patient presents with   Code Sepsis    Kim [name] is a 82 y.o. female.  Patient brought in by EMS from home with concerns for having difficulty breathing.  Upon arrival here her breathing's been fine.  They reported the patient been feeling short of breath for several weeks now.  Patient had a temp of 102.  Patient did go to urgent care earlier today because of the fever and her not feeling well.  And she was diagnosed with urinary tract infection.  Not clear what antibiotic they started her on.  EMS had called a code sepsis from the field.  Patient also apparently was diagnosed with COVID at urgent care today.  Patient denies any cough.  Has had some nausea but no diarrhea.  And some mild abdominal discomfort.  Patient lives at home with family.  Past medical history in for arthritis hypertension.  Patient is never used tobacco products.  Temp on arrival here was 99.  Patient not tachycardic not hypotensive.  Not meeting sepsis criteria by vital signs.  His oxygen saturation 96% on room air.  Patient's blood sugar as per EMS was 119.       Home Medications Prior to Admission medications   Medication Sig Start Date End Date Taking? Authorizing Provider  acetaminophen (TYLENOL 8 HOUR ARTHRITIS PAIN) 650 MG CR tablet Take 650 mg by mouth every 8 (eight) hours as needed for pain.   Yes [provider]  albuterol (VENTOLIN HFA) 108 (90 Base) MCG/ACT inhaler Inhale 1-2 puffs into the lungs every 6 (six) hours as needed for wheezing or shortness of breath. 09/23/23  Yes Del Newman Nip, Tenna Child, FNP  Cholecalciferol (VITAMIN D3) 50 MCG (2000 UT) capsule Take 2,000 Units by mouth daily.   Yes [provider]  furosemide (LASIX) 40 MG tablet Take 40 mg by mouth daily as needed for fluid or edema. 02/15/23  Yes [provider]  meloxicam (MOBIC) 7.5 MG tablet Take 1 tablet (7.5 mg total) by mouth once as needed for up to 1 dose for pain. 09/23/23  Yes Del Newman Nip, Tenna Child, FNP  metFORMIN (GLUCOPHAGE) 500 MG tablet Take 1 tablet (500 mg total) by mouth daily with breakfast. 09/23/23  Yes Del Newman Nip, Tenna Child, FNP  rosuvastatin (CRESTOR) 10 MG tablet Take 1 tablet (10 mg total) by mouth daily. 09/23/23  Yes Del Newman Nip, Tenna Child, FNP  SYMBICORT 80-4.5 MCG/ACT inhaler Inhale 2 puffs into the lungs 2 (two) times daily. 07/23/23  Yes [provider]  triamterene-hydrochlorothiazide (MAXZIDE-25) 37.5-25 MG tablet Take 1 tablet by mouth daily. 09/23/23  Yes Del Newman Nip, Tenna Child, FNP  cefdinir (OMNICEF) 300 MG capsule Take 300 mg by mouth 2 (two) times daily. Patient not taking: Reported on 12/19/2023 12/19/23   [provider]  Spacer/Aero-Holding Chambers (AEROCHAMBER PLUS) inhaler Use with inhaler 01/30/22   Domenick Gong, MD      Allergies    Tramadol    Review of Systems   Review of Systems  Constitutional:  Positive for fatigue and fever. Negative for chills.  HENT:  Negative for ear pain and sore throat.   Eyes:  Negative for pain and visual disturbance.  Respiratory:  Negative for cough and shortness of breath.   Cardiovascular:  Negative for chest pain and palpitations.  Gastrointestinal:  Positive  for nausea. Negative for abdominal pain and vomiting.  Genitourinary:  Negative for dysuria and hematuria.  Musculoskeletal:  Negative for arthralgias and back pain.  Skin:  Negative for color change and rash.  Neurological:  Negative for seizures and syncope.  All other systems reviewed and are negative.   Physical Exam Updated Vital Signs BP 120/64   Pulse 95   Temp 99.3 F (37.4 C) (Oral)   Resp 20   SpO2 98%  Physical Exam Vitals and nursing note reviewed.  Constitutional:      General: She is not in acute distress.    Appearance: Normal appearance. She  is well-developed.  HENT:     Head: Normocephalic and atraumatic.     Mouth/Throat:     Mouth: Mucous membranes are moist.  Eyes:     Conjunctiva/sclera: Conjunctivae normal.  Cardiovascular:     Rate and Rhythm: Normal rate and regular rhythm.     Heart sounds: No murmur heard. Pulmonary:     Effort: Pulmonary effort is normal. No respiratory distress.     Breath sounds: Normal breath sounds. No wheezing, rhonchi or rales.  Abdominal:     General: There is no distension.     Palpations: Abdomen is soft.     Tenderness: There is no abdominal tenderness. There is no guarding.  Musculoskeletal:        General: No swelling.     Cervical back: Neck supple.  Skin:    General: Skin is warm and dry.     Capillary Refill: Capillary refill takes less than 2 seconds.  Neurological:     General: No focal deficit present.     Mental Status: She is alert and oriented to person, place, and time.  Psychiatric:        Mood and Affect: Mood normal.     ED Results / Procedures / Treatments   Labs (all labs ordered are listed, but only abnormal results are displayed) Labs Reviewed  COMPREHENSIVE METABOLIC PANEL - Abnormal; Notable for the following components:      Result Value   Sodium 133 (*)    Potassium 3.3 (*)    Glucose, Bld 111 (*)    All other components within normal limits  CBC WITH DIFFERENTIAL/PLATELET - Abnormal; Notable for the following components:   Abs Immature Granulocytes 0.16 (*)    All other components within normal limits  CULTURE, BLOOD (ROUTINE X 2)  CULTURE, BLOOD (ROUTINE X 2)  RESP PANEL BY RT-PCR (RSV, FLU A&B, COVID)  [id number]  LACTIC ACID, PLASMA  LACTIC ACID, PLASMA  URINALYSIS, W/ REFLEX TO CULTURE (INFECTION SUSPECTED)    EKG EKG Interpretation Date/Time:  Thursday December 19 2023 17:12:12 EST Ventricular Rate:  92 PR Interval:  187 QRS Duration:  109 QT Interval:  357 QTC Calculation: 442 R Axis:   -74  Text Interpretation: Sinus or ectopic  atrial rhythm Left anterior fascicular block Probable left ventricular hypertrophy Anterior Q waves, possibly due to LVH No significant change since last tracing Confirmed by Vanetta Mulders [phone]) on 12/19/2023 5:25:42 PM  Radiology No results found.  Procedures Procedures    Medications Ordered in ED Medications - No data to display  ED Course/ Medical Decision Making/ A&P                                 Medical Decision Making Amount and/or Complexity of Data Reviewed Labs: ordered. Radiology: ordered.  Risk OTC drugs. Prescription drug management.   Patient's respiratory panel here negative.  Blood cultures pending lactic acid normal at 1.0.  CBC no leukocytosis hemoglobin 13 platelets 201 portable chest x-ray pending.  Complete metabolic panel significant for potassium just slightly down at 3.3.  Renal function is normal LFTs are normal.  Sodium down a little bit at 133.  Will probably give a 500 cc bolus of fluid.  Urinalysis is pending as well.  Patient's urinalysis is back.  Not consistent with urinary tract infection.  Patient's lactic acid also very reassuring at 1.1.  As mentioned respiratory panel was negative blood cultures have been done and are pending.  Patient without a lot of acute findings.  Maybe a little bit of some mild hypokalemia.  Renal function was normal no leukocytosis.  And chest x-ray left base subsegmental atelectasis or scarring.  Based on patient's initial concern for being short of breath and her having fever.  Will go ahead and get CT chest to evaluate further.  Patient left AMA before the CT chest was back.  But it is now back it is negative. Final Clinical Impression(s) / ED Diagnoses Final diagnoses:  Fever, unspecified fever cause    Rx / DC Orders ED Discharge Orders     None         Vanetta Mulders, MD 12/19/23 1836    Vanetta Mulders, MD 12/19/23 [id number]    Vanetta Mulders, MD 12/19/23 2318

## 2023-12-19 NOTE — ED Notes (Signed)
Pt has left AMA with daughter, again notified of risks of leaving AMA.

## 2023-12-19 NOTE — ED Notes (Signed)
Pt unable to tolerate bedpan, placed pt on PW

## 2023-12-19 NOTE — ED Notes (Signed)
Pt's daughter came out to desk shouting, "we gonna leave, we can't wait anymore! I gotta work in the morning". Still awaiting CT results. Dr. Deretha Emory notified. This RN spoke to pt, who is also in agreement with this. Notified of risks of leaving AMA

## 2023-12-24 LAB — CULTURE, BLOOD (ROUTINE X 2)
Culture: NO GROWTH
Culture: NO GROWTH
Special Requests: ADEQUATE

## 2024-01-16 ENCOUNTER — Ambulatory Visit (INDEPENDENT_AMBULATORY_CARE_PROVIDER_SITE_OTHER): Payer: 59 | Admitting: Family Medicine

## 2024-01-16 ENCOUNTER — Encounter: Payer: Self-pay | Admitting: Family Medicine

## 2024-01-16 VITALS — BP 152/92 | HR 101 | Ht 64.0 in | Wt 180.0 lb

## 2024-01-16 DIAGNOSIS — Z7984 Long term (current) use of oral hypoglycemic drugs: Secondary | ICD-10-CM | POA: Diagnosis not present

## 2024-01-16 DIAGNOSIS — I10 Essential (primary) hypertension: Secondary | ICD-10-CM | POA: Diagnosis not present

## 2024-01-16 DIAGNOSIS — E1169 Type 2 diabetes mellitus with other specified complication: Secondary | ICD-10-CM | POA: Diagnosis not present

## 2024-01-16 DIAGNOSIS — E119 Type 2 diabetes mellitus without complications: Secondary | ICD-10-CM | POA: Insufficient documentation

## 2024-01-16 MED ORDER — OLMESARTAN MEDOXOMIL-HCTZ 20-12.5 MG PO TABS: 1.0000 | ORAL_TABLET | Freq: Every day | ORAL | 2 refills | Status: DC

## 2024-01-16 NOTE — Assessment & Plan Note (Signed)
D/C Triamterene-hydrochlorothiazide 37.5-25 once daily  Start olmesartan-hydrochlorothiazide 20mg -12.5mg  once daily Follow up in 4 weeks Labs ordered. Discussed with  patient to monitor their blood pressure regularly and maintain a heart-healthy diet rich in fruits, vegetables, whole grains, and low-fat dairy, while reducing sodium intake to less than 2,300 mg per day. Regular physical activity, such as 30 minutes of moderate exercise most days of the week, will help lower blood pressure and improve overall cardiovascular health. Avoiding smoking, limiting alcohol consumption, and managing stress. Take  prescribed medication, & take it as directed and avoid skipping doses. Seek emergency care if your blood pressure is (over 180/100) or you experience chest pain, shortness of breath, or sudden vision changes.Patient verbalizes understanding regarding plan of care and all questions answered.

## 2024-01-16 NOTE — Progress Notes (Signed)
Established Patient Office Visit   Subjective  Patient ID: Kim [name], female    DOB: Aug 02, 1941  Age: 83 y.o. MRN: [id number]  Chief Complaint  Patient presents with   Follow-up    type 2 diabetes, hypertension. Has been having trouble with dry mouth and nausea x 2 weeks.     She  has a past medical history of Acid reflux, Arthritis, Back spasm, Carpal tunnel syndrome, Hypertension, and Rheumatoid arthritis (HCC).  Diabetes She presents for her follow-up diabetic visit. She has type 2 diabetes mellitus. Her disease course has been fluctuating. Pertinent negatives for hypoglycemia include no dizziness, headaches, speech difficulty or tremors. Pertinent negatives for diabetes include no chest pain, no fatigue, no foot ulcerations, no polydipsia, no polyphagia and no polyuria. Pertinent negatives for hypoglycemia complications include no blackouts. Pertinent negatives for diabetic complications include no peripheral neuropathy. Risk factors for coronary artery disease include diabetes mellitus, dyslipidemia, hypertension and sedentary lifestyle. Current diabetic treatment includes oral agent (monotherapy). She is compliant with treatment all of the time. She is following a generally healthy diet. Meal planning includes avoidance of concentrated sweets. She rarely participates in exercise. An ACE inhibitor/angiotensin II receptor blocker is being taken. She does not see a podiatrist.Eye exam is not current.    Review of Systems  Constitutional:  Negative for chills, fatigue and fever.  Respiratory:  Negative for shortness of breath.   Cardiovascular:  Negative for chest pain.  Neurological:  Negative for dizziness, tremors, speech difficulty and headaches.  Endo/Heme/Allergies:  Negative for polydipsia and polyphagia.      Objective:     BP (!) 152/92   Pulse (!) 101   Ht 5\' 4"  (1.626 m)   Wt 180 lb 0.6 oz (81.7 kg)   SpO2 91%   BMI 30.90 kg/m  BP Readings from Last 3  Encounters:  01/16/24 (!) 152/92  12/19/23 102/60  09/23/23 129/80      Physical Exam Vitals reviewed.  Constitutional:      General: She is not in acute distress.    Appearance: Normal appearance. She is not ill-appearing, toxic-appearing or diaphoretic.  HENT:     Head: Normocephalic.  Eyes:     General:        Right eye: No discharge.        Left eye: No discharge.     Conjunctiva/sclera: Conjunctivae normal.  Cardiovascular:     Rate and Rhythm: Normal rate.     Pulses: Normal pulses.     Heart sounds: Normal heart sounds.  Pulmonary:     Effort: Pulmonary effort is normal. No respiratory distress.     Breath sounds: Normal breath sounds.  Abdominal:     General: Bowel sounds are normal.     Palpations: Abdomen is soft.     Tenderness: There is no abdominal tenderness. There is no right CVA tenderness, left CVA tenderness or guarding.  Musculoskeletal:     Cervical back: Normal range of motion.  Skin:    General: Skin is warm and dry.     Capillary Refill: Capillary refill takes less than 2 seconds.  Neurological:     Mental Status: She is alert.  Psychiatric:        Mood and Affect: Mood normal.      No results found for any visits on 01/16/24.  The ASCVD Risk score (Arnett DK, et al., 2019) failed to calculate for the following reasons:   The 2019 ASCVD risk score is only valid for  ages 22 to 108    Assessment & Plan:  Primary hypertension Assessment & Plan: D/C Triamterene-hydrochlorothiazide 37.5-25 once daily  Start olmesartan-hydrochlorothiazide 20mg -12.5mg  once daily Follow up in 4 weeks Labs ordered. Discussed with  patient to monitor their blood pressure regularly and maintain a heart-healthy diet rich in fruits, vegetables, whole grains, and low-fat dairy, while reducing sodium intake to less than 2,300 mg per day. Regular physical activity, such as 30 minutes of moderate exercise most days of the week, will help lower blood pressure and improve  overall cardiovascular health. Avoiding smoking, limiting alcohol consumption, and managing stress. Take  prescribed medication, & take it as directed and avoid skipping doses. Seek emergency care if your blood pressure is (over 180/100) or you experience chest pain, shortness of breath, or sudden vision changes.Patient verbalizes understanding regarding plan of care and all questions answered.   Orders: -     BMP8+eGFR -     CBC with Differential/Platelet -     Lipid panel  Type 2 diabetes mellitus without complication, without long-term current use of insulin (HCC) Assessment & Plan: Last Hemoglobin A1c:  6.5 Labs: Ordered today, results pending; will follow up accordingly. The patient reports adhering to prescribed medications: Metformin 500 mg once daily  Reviewed non-pharmacological interventions, including a balanced diet rich in lean proteins, healthy fats, whole grains, and high-fiber vegetables. Emphasized reducing refined sugars and processed carbohydrates, and incorporating more fruits, leafy greens, and legumes. Education: Patient was educated on recognizing signs and symptoms of both hypoglycemia and hyperglycemia, and advised to seek emergency care if these symptoms occur. Follow-Up: Scheduled for follow-up in 3-4 months, or sooner if needed. Patient Understanding: The patient verbalized understanding of the care plan, and all questions were answered. Additional Care: Ophthalmology referral was placed. Foot exam results were within normal limits.   Orders: -     Hemoglobin A1c -     Ambulatory referral to Ophthalmology  Other orders -     Olmesartan Medoxomil-HCTZ; Take 1 tablet by mouth daily.  Dispense: 30 tablet; Refill: 2    Return in about 4 weeks (around 02/13/2024), or if symptoms worsen or fail to improve, for re-check blood pressure.   Cruzita Lederer Newman Nip, FNP

## 2024-01-16 NOTE — Assessment & Plan Note (Signed)
 Last Hemoglobin A1c: 6.5 Labs: Ordered today, results pending; will follow up accordingly. The patient reports adhering to prescribed medications:  Metformin 500 mg once daily Reviewed non-pharmacological interventions, including a balanced diet rich in lean proteins, healthy fats, whole grains, and high-fiber vegetables. Emphasized reducing refined sugars and processed carbohydrates, and incorporating more fruits, leafy greens, and legumes. Education: Patient was educated on recognizing signs and symptoms of both hypoglycemia and hyperglycemia, and advised to seek emergency care if these symptoms occur. Follow-Up: Scheduled for follow-up in 3-4 months, or sooner if needed. Patient Understanding: The patient verbalized understanding of the care plan, and all questions were answered. Additional Care: Ophthalmology referral was placed. Foot exam results were within normal limits.

## 2024-01-16 NOTE — Patient Instructions (Signed)

## 2024-01-28 ENCOUNTER — Telehealth: Payer: Self-pay | Admitting: Family Medicine

## 2024-01-28 NOTE — Telephone Encounter (Signed)
Copied from CRM [id number]. Topic: Referral - Question >> Jan 28, 2024 11:21 AM Antony Haste wrote: Reason for CRM: The patient states she received a call to schedule a diabetic eye-exam in [place], I explained this is for her referral, she states she does not want to be scheduled with another ophthalmologist and prefers to go to her usual eye doctor in [place] since it's closer to her residence.

## 2024-01-30 LAB — CBC WITH DIFFERENTIAL/PLATELET
Basophils Absolute: 0.1 10*3/uL (ref 0.0–0.2)
Basos: 1 %
EOS (ABSOLUTE): 0.2 10*3/uL (ref 0.0–0.4)
Eos: 3 %
Hematocrit: 43 % (ref 34.0–46.6)
Hemoglobin: 13.4 g/dL (ref 11.1–15.9)
Immature Grans (Abs): 0 10*3/uL (ref 0.0–0.1)
Immature Granulocytes: 0 %
Lymphocytes Absolute: 3 10*3/uL (ref 0.7–3.1)
Lymphs: 47 %
MCH: 28.8 pg (ref 26.6–33.0)
MCHC: 31.2 g/dL — ABNORMAL LOW (ref 31.5–35.7)
MCV: 93 fL (ref 79–97)
Monocytes Absolute: 0.6 10*3/uL (ref 0.1–0.9)
Monocytes: 9 %
Neutrophils Absolute: 2.6 10*3/uL (ref 1.4–7.0)
Neutrophils: 40 %
Platelets: 234 10*3/uL (ref 150–450)
RBC: 4.65 x10E6/uL (ref 3.77–5.28)
RDW: 13.3 % (ref 11.7–15.4)
WBC: 6.6 10*3/uL (ref 3.4–10.8)

## 2024-01-30 LAB — BMP8+EGFR
BUN/Creatinine Ratio: 26 (ref 12–28)
BUN: 19 mg/dL (ref 8–27)
CO2: 23 mmol/L (ref 20–29)
Calcium: 9.1 mg/dL (ref 8.7–10.3)
Chloride: 105 mmol/L (ref 96–106)
Creatinine, Ser: 0.72 mg/dL (ref 0.57–1.00)
Glucose: 87 mg/dL (ref 70–99)
Potassium: 4.1 mmol/L (ref 3.5–5.2)
Sodium: 143 mmol/L (ref 134–144)
eGFR: 84 mL/min/{1.73_m2} (ref >59)

## 2024-01-30 LAB — LIPID PANEL
Chol/HDL Ratio: 3.3 {ratio} (ref 0.0–4.4)
Cholesterol, Total: 194 mg/dL (ref 100–199)
HDL: 59 mg/dL (ref >39)
LDL Chol Calc (NIH): 119 mg/dL — ABNORMAL HIGH (ref 0–99)
Triglycerides: 87 mg/dL (ref 0–149)
VLDL Cholesterol Cal: 16 mg/dL (ref 5–40)

## 2024-01-30 LAB — HEMOGLOBIN A1C
Est. average glucose Bld gHb Est-mCnc: 134 mg/dL
Hgb A1c MFr Bld: 6.3 % — ABNORMAL HIGH (ref 4.8–5.6)

## 2024-01-30 NOTE — Progress Notes (Signed)
Please inform patient,   Hemoglobin A1c levels has improved, continue Metformin 500 mg once daily  Cholesterol levels has improved continue Crestor 10 mg once daily   A Sample Day of Eating for Diabetes:  Breakfast: Oatmeal with a handful of berries and a sprinkle of chia seeds, paired with a boiled egg.  Lunch: Grilled chicken breast on a bed of spinach and kale, topped with avocado, a drizzle of olive oil, and a slice of whole grain bread.  Snack: A handful of almonds or a small apple with peanut butter.  Dinner: Baked salmon with roasted broccoli and quinoa.  Snack (if needed): A piece of cheese or a small bowl of Greek yogurt.  Find an activity that you will enjoyandstart to be active at least 5 days a week for 30 minutes each day.

## 2024-02-04 ENCOUNTER — Other Ambulatory Visit: Payer: Self-pay | Admitting: Family Medicine

## 2024-02-04 ENCOUNTER — Ambulatory Visit: Payer: Self-pay | Admitting: Family Medicine

## 2024-02-04 ENCOUNTER — Encounter: Payer: Self-pay | Admitting: Family Medicine

## 2024-02-04 MED ORDER — CHLORTHALIDONE 25 MG PO TABS: 12.5000 mg | ORAL_TABLET | Freq: Every day | ORAL | 2 refills | Status: DC

## 2024-02-04 MED ORDER — AMLODIPINE BESYLATE 10 MG PO TABS: 10.0000 mg | ORAL_TABLET | Freq: Every day | ORAL | 2 refills | Status: DC

## 2024-02-04 NOTE — Telephone Encounter (Signed)
  Discontinue olmesartan-hydrochlorothiazide and initiate amlodipine 10 mg once daily along with chlorthalidone 12.5 mg once daily. Medication sent to pharmacy A keep follow-up appointment on 02/19/24

## 2024-02-04 NOTE — Telephone Encounter (Addendum)
 Chief Complaint: leg swelling Symptoms: leg swelling, blurry vision, CP, SOB worse than pt's baseline Frequency: since she started taking olmesartan -hydrochlorothiazide (Benicar  HCT) on 1/16  Pertinent Negatives: Patient denies dizziness, weakness, heart palpitations, fever, drainage from legs, signs of infection Disposition: [x] ED /[] Urgent Care (no appt availability in office) / [] Appointment(In office/virtual)/ []  [facility]/ [] Home Care/ [x] Refused Recommended Disposition /[] [facility]/ []  Follow-up with PCP Additional Notes: Pt reports bilateral leg swelling up to and including, but not past, the knees. Pt reports leg swelling started after she began taking olmesartan -hydrochlorothiazide on 1/16. Pt states she has not taken it since sometime last week since she believes it is the cause of her symptoms. Additionally, pt reports blurry vision since starting that medication. Pt states blurry vision comes and goes and that she sits down and puts on her glasses during episodes. Denies dizziness or weakness, headache. Pt does report chest pain/tightness. States this comes and goes, unable to recall when it first started. States it radiates into the left shoulder and sometimes into the back. Rates it a 2/10, denies it is present currently. Pt states pain improves if she uses her albuterol  inhaler. Pt prescribed albuterol  for wheezing and SOB. Pt states her SOB has been a bit worse since the swelling began. Does not take her O2 at home with a pulse ox. Pt unable to say to RN whether swelling leaves an indentation or is pitting. Pt states she is not taking lasix  or any diuretic. Per protocol, pt advised to go to the ED. Pt declined. Pt states she does not want to wait around there and has no transport. RN advised pt she will call 911 and EMS can perform an EKG and other tests to begin caring for her. Pt declined. Pt states she has a home health aid that comes every day and should be  there shortly. Pt states she can have the home health aid bring her to the ED if she decides to go. RN advised pt to call 911/have the home health aid call 911 if the chest pain comes back or if she becomes more SOB, or anything worsens. RN advised she will send information about pt's symptoms to the appropriate people for follow up at [facility]. Pt verbalized understanding.  This RN called the [facility] as well to let staff know about pt's refusal to go to the ED.  Copied from CRM [id number]. Topic: Clinical - Red Word Triage >> Feb 04, 2024 10:20 AM Elle L wrote: Red Word that prompted transfer to Nurse Triage: The patient states that her legs and feet are swelling causing discomfort and her vision is blurry from her olmesartan -hydrochlorothiazide (BENICAR  HCT) 20-12.5 MG tablet. Reason for Disposition  Pain also in shoulder(s) or arm(s) or jaw  (Exception: Pain is clearly made worse by movement.)  Answer Assessment - Initial Assessment Questions 1. ONSET: When did the swelling start? (e.g., minutes, hours, days)     When pt started Benicar  on 1/16 2. LOCATION: What part of the leg is swollen?  Are both legs swollen or just one leg?     Both legs and feet, up in the knees also. Both are swollen the same. 3. SEVERITY: How bad is the swelling? (e.g., localized; mild, moderate, severe)   - Localized: Small area of swelling localized to one leg.   - MILD pedal edema: Swelling limited to foot and ankle, pitting edema < 1/4 inch (6 mm) deep, rest and elevation eliminate most or all swelling.   -  MODERATE edema: Swelling of lower leg to knee, pitting edema > 1/4 inch (6 mm) deep, rest and elevation only partially reduce swelling.   - SEVERE edema: Swelling extends above knee, facial or hand swelling present.      Up to knees - wearing socks, pt unsure if there is pitting 4. REDNESS: Does the swelling look red or infected?     No - they do not look infected 5. PAIN: Is the swelling painful to touch? If  Yes, ask: How painful is it?   (Scale 1-10; mild, moderate or severe)     2/10 6. FEVER: Do you have a fever? If Yes, ask: What is it, how was it measured, and when did it start?      No fever/chills 7. CAUSE: What do you think is causing the leg swelling?     New medication 8. MEDICAL HISTORY: Do you have a history of blood clots (e.g., DVT), cancer, heart failure, kidney disease, or liver failure?     No, hx HTN 9. RECURRENT SYMPTOM: Have you had leg swelling before? If Yes, ask: When was the last time? What happened that time?     'I've had a little bit of it but wasn't like this 10. OTHER SYMPTOMS: Do you have any other symptoms? (e.g., chest pain, difficulty breathing)       Some SOB (pt states she always have SOB), some chest pain, a little tight (it goes and comes - states it's not new)  Answer Assessment - Initial Assessment Questions 1. LOCATION: Where does it hurt?       Just kind of in the middle of my chest 2. RADIATION: Does the pain go anywhere else? (e.g., into neck, jaw, arms, back)     It kind of goes in my left shoulder and sometimes in my back 3. ONSET: When did the chest pain begin? (Minutes, hours or days)      Comes and goes - pt unsure when it started 4. PATTERN: Does the pain come and go, or has it been constant since it started?  Does it get worse with exertion?      Comes and goes - it don't last too long, maybe 15 minutes, if I take my inhaler it eases off 5. DURATION: How long does it last (e.g., seconds, minutes, hours)     15 minutes, inhaler helps 6. SEVERITY: How bad is the pain?  (e.g., Scale 1-10; mild, moderate, or severe)    - MILD (1-3): doesn't interfere with normal activities     - MODERATE (4-7): interferes with normal activities or awakens from sleep    - SEVERE (8-10): excruciating pain, unable to do any normal activities       2/10 7. CARDIAC RISK FACTORS: Do you have any history of heart problems  or risk factors for heart disease? (e.g., angina, prior heart attack; diabetes, high blood pressure, high cholesterol, smoker, or strong family history of heart disease)     HTN 8. PULMONARY RISK FACTORS: Do you have any history of lung disease?  (e.g., blood clots in lung, asthma, emphysema, birth control pills)     Uses albuterol  inhaler for wheezing and SOB 9. CAUSE: What do you think is causing the chest pain?     Pt not sure 10. OTHER SYMPTOMS: Do you have any other symptoms? (e.g., dizziness, nausea, vomiting, sweating, fever, difficulty breathing, cough)       No nausea/vomiting, no heart palpitations, no sweating. CP does not make  pt's baseline SOB worse. No dizziness or weakness. Pt coughing on the phone, states she is coughing a little bit. Pt endorses blurry vision with the Benicar  - it's not that I can't see, it's just a little bit. No headache. Wants to be prescribed something else, or go back to her old medication. Endorses leg swelling, states she is not taking Lasix . States when her vision is blurry, she sits down. Blurry vision comes and goes. Pt states she puts glasses on and that helps.  Protocols used: Leg Swelling and Edema-A-AH, Chest Pain-A-AH

## 2024-02-04 NOTE — Progress Notes (Unsigned)
Stop taking olmesartan-hydrochlorothiazide medication and Start amlodipine 10 mg once daily and Chlorthalidone 12.5 mg once daily Follow up appointment is needed in 4 weeks

## 2024-02-05 NOTE — Telephone Encounter (Signed)
 Pt. Has been informed and states she understands.

## 2024-02-06 ENCOUNTER — Encounter: Payer: Self-pay | Admitting: Emergency Medicine

## 2024-02-06 ENCOUNTER — Ambulatory Visit
Admission: EM | Admit: 2024-02-06 | Discharge: 2024-02-06 | Disposition: A | Discharge status: Home / Self Care | Payer: 59 | Attending: Nurse Practitioner | Admitting: Nurse Practitioner

## 2024-02-06 DIAGNOSIS — B349 Viral infection, unspecified: Secondary | ICD-10-CM

## 2024-02-06 DIAGNOSIS — R6889 Other general symptoms and signs: Secondary | ICD-10-CM

## 2024-02-06 LAB — POC COVID19/FLU A&B COMBO
Covid Antigen, POC: NEGATIVE
Influenza A Antigen, POC: NEGATIVE
Influenza B Antigen, POC: NEGATIVE

## 2024-02-06 MED ORDER — ALBUTEROL SULFATE HFA 108 (90 BASE) MCG/ACT IN AERS: 2.0000 | INHALATION_SPRAY | Freq: Four times a day (QID) | RESPIRATORY_TRACT | 0 refills | Status: DC | PRN

## 2024-02-06 MED ORDER — BENZONATATE 100 MG PO CAPS: 100.0000 mg | ORAL_CAPSULE | Freq: Three times a day (TID) | ORAL | 0 refills | Status: DC

## 2024-02-06 NOTE — ED Provider Notes (Signed)
 [facility]    CSN: [id number] Arrival date & time: 02/06/24  1106      History   Chief Complaint No chief complaint on file.   HPI Kim [name] is a 83 y.o. female.   The history is provided by the patient.   Patient presents with a 2-day history of chills, bodyaches, and cough.  Denies fever, headache, ear pain, nasal congestion, runny nose, wheezing, difficulty breathing, chest pain, abdominal pain, nausea, vomiting, diarrhea, or rash.  Patient reports that she has been taking over-the-counter cough and cold medications for her symptoms.  Reports that she has no butyryl inhaler that she uses for underlying shortness of breath, states that she has also been using that more recently.  Denies any obvious known sick contacts.  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   Back pain, chronic 01/09/2010    Past Surgical History:  Procedure Laterality Date   KNEE SURGERY     ORIF TOE FRACTURE Left 02/27/2021   Procedure: OPEN REDUCTION INTERNAL FIXATION (ORIF) METATARSAL (TOE) FRACTURE;  Surgeon: Onesimo Oneil LABOR, MD;  Location: [facility];  Service: Orthopedics;  Laterality: Left;  ORIF of 1st and 2nd metatarsal fractures   TUBAL LIGATION      OB History     Gravida  8   Para  8   Term  8   Preterm      AB      Living  8      SAB      IAB      Ectopic      Multiple      Live Births  8            Home Medications    Prior to Admission medications   Medication Sig Start Date End Date  Taking? Authorizing Provider  albuterol  (VENTOLIN  HFA) 108 (90 Base) MCG/ACT inhaler Inhale 2 puffs into the lungs every 6 (six) hours as needed. 02/06/24  Yes Leath-Warren, Etta PARAS, NP  benzonatate  (TESSALON ) 100 MG capsule Take 1 capsule (100 mg total) by mouth every 8 (eight) hours. 02/06/24  Yes Leath-Warren, Etta PARAS, NP  acetaminophen  (TYLENOL  8 HOUR ARTHRITIS PAIN) 650 MG CR tablet Take 650 mg by mouth every 8 (eight) hours as needed for pain.    [provider]  amLODipine  (NORVASC ) 10 MG tablet Take 1 tablet (10 mg total) by mouth daily. 02/04/24   Del Orbe Polanco, Iliana, FNP  cefdinir (OMNICEF) 300 MG capsule Take 300 mg by mouth 2 (two) times daily. Patient not taking: Reported on 12/19/2023 12/19/23   [provider]  chlorthalidone  (HYGROTON ) 25 MG tablet Take 0.5 tablets (12.5 mg total) by mouth daily. 02/04/24   Del Orbe Polanco, Iliana, FNP  Cholecalciferol (VITAMIN D3) 50 MCG (2000 UT) capsule Take 2,000 Units by mouth daily.    [provider]  furosemide  (LASIX ) 40 MG tablet Take 40 mg by mouth daily as needed for fluid or edema.  02/15/23   [provider]  meloxicam  (MOBIC ) 7.5 MG tablet Take 1 tablet (7.5 mg total) by mouth once as needed for up to 1 dose for pain. 09/23/23   Del Orbe Polanco, Iliana, FNP  metFORMIN  (GLUCOPHAGE ) 500 MG tablet Take 1 tablet (500 mg total) by mouth daily with breakfast. 09/23/23   Del Wilhelmena Falter, Iliana, FNP  rosuvastatin  (CRESTOR ) 10 MG tablet Take 1 tablet (10 mg total) by mouth daily. 09/23/23   Del Wilhelmena Falter Sola, FNP  Spacer/Aero-Holding Chambers (AEROCHAMBER PLUS) inhaler Use with inhaler 01/30/22   Van Knee, MD  SYMBICORT  80-4.5 MCG/ACT inhaler Inhale 2 puffs into the lungs 2 (two) times daily. 07/23/23   [provider]    Family History Family History  Problem Relation Age of Onset   Heart failure Mother    Stroke Mother    Heart failure Father    Stroke Father    Hypertension  Sister     Social History Social History   Tobacco Use   Smoking status: Never   Smokeless tobacco: Never  Vaping Use   Vaping status: Never Used  Substance Use Topics   Alcohol use: No   Drug use: No     Allergies   Tramadol   Review of Systems Review of Systems Per HPI  Physical Exam Triage Vital Signs ED Triage Vitals  Encounter Vitals Group     BP 02/06/24 1151 115/71     Systolic BP Percentile --      Diastolic BP Percentile --      Pulse Rate 02/06/24 1151 96     Resp --      Temp 02/06/24 1151 99 F (37.2 C)     Temp Source 02/06/24 1151 Oral     SpO2 02/06/24 1151 92 %     Weight --      Height --      Head Circumference --      Peak Flow --      Pain Score 02/06/24 1153 3     Pain Loc --      Pain Education --      Exclude from Growth Chart --    No data found.  Updated Vital Signs BP 115/71 (BP Location: Right Arm)   Pulse 96   Temp 99 F (37.2 C) (Oral)   SpO2 92%   Visual Acuity Right Eye Distance:   Left Eye Distance:   Bilateral Distance:    Right Eye Near:   Left Eye Near:    Bilateral Near:     Physical Exam Vitals and nursing note reviewed.  Constitutional:      General: She is not in acute distress.    Appearance: Normal appearance.  HENT:     Head: Normocephalic.     Right Ear: Tympanic membrane, ear canal and external ear normal.     Left Ear: Tympanic membrane, ear canal and external ear normal.     Nose: Congestion present.     Mouth/Throat:     Lips: Pink.     Mouth: Mucous membranes are moist.     Pharynx: Oropharynx is clear. Uvula midline. Postnasal drip present. No pharyngeal swelling, oropharyngeal exudate, posterior oropharyngeal erythema or uvula swelling.  Eyes:     Extraocular Movements: Extraocular movements intact.     Conjunctiva/sclera: Conjunctivae normal.     Pupils: Pupils are equal, round, and reactive to light.  Cardiovascular:     Rate and Rhythm: Normal rate and regular rhythm.  Pulses:  Normal pulses.     Heart sounds: Normal heart sounds.  Pulmonary:     Effort: Pulmonary effort is normal. No respiratory distress.     Breath sounds: Normal breath sounds. No stridor. No wheezing, rhonchi or rales.  Abdominal:     General: Bowel sounds are normal.     Palpations: Abdomen is soft.     Tenderness: There is no abdominal tenderness.  Musculoskeletal:     Cervical back: Normal range of motion.  Lymphadenopathy:     Cervical: No cervical adenopathy.  Skin:    General: Skin is warm and dry.  Neurological:     General: No focal deficit present.     Mental Status: She is alert and oriented to person, place, and time.  Psychiatric:        Mood and Affect: Mood normal.        Behavior: Behavior normal.      UC Treatments / Results  Labs (all labs ordered are listed, but only abnormal results are displayed) Labs Reviewed  POC COVID19/FLU A&B COMBO - Normal    EKG   Radiology No results found.  Procedures Procedures (including critical care time)  Medications Ordered in UC Medications - No data to display  Initial Impression / Assessment and Plan / UC Course  I have reviewed the triage vital signs and the nursing notes.  Pertinent labs & imaging results that were available during my care of the patient were reviewed by me and considered in my medical decision making (see chart for details).  On exam, lung sounds are clear throughout, room air sats at 92%.  COVID/flu test was negative.  Symptoms consistent with viral illness/flulike symptoms.  Will start patient on Tessalon  100 mg for her cough, along with refilling her albuterol  inhaler.  Supportive care recommendations were provided and discussed with the patient to include fluids, rest, over-the-counter analgesics, use for emitted fire at nighttime during sleep.  Discussed with patient indications regarding follow-up.  Patient was in agreement with this plan of care and verbalizes understanding.  All questions  were answered.  Patient stable for discharge.  Final Clinical Impressions(s) / UC Diagnoses   Final diagnoses:  Flu-like symptoms  Viral illness     Discharge Instructions      Your covid/flu test was negative. Take medication as prescribed. May take over-the-counter Tylenol  as needed for pain, fever, or general discomfort Make sure you are drinking plenty of fluids. You may drink Pedialyte or Gatorade to help with hydration. For you cough, it will be helpful to use a humidifier in your bedroom at night and to sleep elevated while symptoms persist. If you develop worsening cough, shortness of breath, or other concerns, you may follow-up in this clinic or with your primary care physician for further evaluation. Follow-up as needed.     ED Prescriptions     Medication Sig Dispense Auth. Provider   benzonatate  (TESSALON ) 100 MG capsule Take 1 capsule (100 mg total) by mouth every 8 (eight) hours. 30 capsule Leath-Warren, Etta PARAS, NP   albuterol  (VENTOLIN  HFA) 108 (90 Base) MCG/ACT inhaler Inhale 2 puffs into the lungs every 6 (six) hours as needed. 8 g Leath-Warren, Etta PARAS, NP      PDMP not reviewed this encounter.   Gilmer Etta PARAS, NP 02/06/24 1228

## 2024-02-06 NOTE — Discharge Instructions (Addendum)
 Your covid/flu test was negative. Take medication as prescribed. May take over-the-counter Tylenol  as needed for pain, fever, or general discomfort Make sure you are drinking plenty of fluids. You may drink Pedialyte or Gatorade to help with hydration. For you cough, it will be helpful to use a humidifier in your bedroom at night and to sleep elevated while symptoms persist. If you develop worsening cough, shortness of breath, or other concerns, you may follow-up in this clinic or with your primary care physician for further evaluation. Follow-up as needed.

## 2024-02-06 NOTE — ED Triage Notes (Signed)
 Cough and body aches with chills since Tuesday

## 2024-02-17 NOTE — Patient Instructions (Signed)

## 2024-02-17 NOTE — Progress Notes (Unsigned)
   Established Patient Office Visit   Subjective  Patient ID: Kim [name], female    DOB: [birth year]  Age: 83 y.o. MRN: [id number]  No chief complaint on file.   She  has a past medical history of Acid reflux, Arthritis, Back spasm, Carpal tunnel syndrome, Hypertension, and Rheumatoid arthritis (HCC).  HPI  ROS    Objective:     There were no vitals taken for this visit. {Vitals History (Optional):23777}  Physical Exam   No results found for any visits on 02/19/24.  The ASCVD Risk score (Arnett DK, et al., 2019) failed to calculate for the following reasons:   The 2019 ASCVD risk score is only valid for ages 85 to 36    Assessment & Plan:  There are no diagnoses linked to this encounter.  No follow-ups on file.   Cruzita Lederer Newman Nip, FNP

## 2024-02-18 NOTE — Progress Notes (Deleted)
 Office Visit Note  Patient: Kim [name]             Date of Birth: 1941/07/10           MRN: [id number]             PCP: Rica Records, FNP Referring: Wylene Men* Visit Date: 03/03/2024 Occupation: @GUAROCC @  Subjective:  No chief complaint on file.   History of Present Illness: Kim [name] is a 83 y.o. female ***     Activities of Daily Living:  Patient reports morning stiffness for *** {minute/hour:19697}.   Patient {ACTIONS;DENIES/REPORTS:21021675::"Denies"} nocturnal pain.  Difficulty dressing/grooming: {ACTIONS;DENIES/REPORTS:21021675::"Denies"} Difficulty climbing stairs: {ACTIONS;DENIES/REPORTS:21021675::"Denies"} Difficulty getting out of chair: {ACTIONS;DENIES/REPORTS:21021675::"Denies"} Difficulty using hands for taps, buttons, cutlery, and/or writing: {ACTIONS;DENIES/REPORTS:21021675::"Denies"}  No Rheumatology ROS completed.   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   Back pain, chronic 01/09/2010    Past Medical History:  Diagnosis Date   Acid reflux    Arthritis    Back spasm    Carpal tunnel syndrome    Hypertension    Rheumatoid arthritis (HCC)     Family History  Problem Relation Age of Onset   Heart failure Mother    Stroke Mother    Heart failure Father    Stroke Father    Hypertension Sister    Past Surgical History:  Procedure Laterality Date   KNEE SURGERY     ORIF TOE FRACTURE Left 02/27/2021   Procedure: OPEN REDUCTION INTERNAL FIXATION (ORIF) METATARSAL (TOE) FRACTURE;  Surgeon: Oliver Barre, MD;  Location: [facility];  Service: Orthopedics;  Laterality: Left;  ORIF of 1st and 2nd metatarsal  fractures   TUBAL LIGATION     Social History   Social History Narrative   Not on file   Immunization History  Administered Date(s) Administered   Influenza, High Dose Seasonal PF 11/13/2019   Influenza,inj,Quad PF,6+ Mos 01/30/2019   Tdap 09/16/2015     Objective: Vital Signs: There were no vitals taken for this visit.   Physical Exam   Musculoskeletal Exam: ***  CDAI Exam: CDAI Score: -- Patient Global: --; Provider Global: -- Swollen: --; Tender: -- Joint Exam 03/03/2024   No joint exam has been documented for this visit   There is currently no information documented on the homunculus. Go to the Rheumatology activity and complete the homunculus joint exam.  Investigation: No additional findings.  Imaging: No results found.  Recent Labs: Lab Results  Component Value Date   WBC 6.6 01/29/2024   HGB 13.4 01/29/2024   PLT 234 01/29/2024   NA 143 01/29/2024   K 4.1 01/29/2024   CL 105 01/29/2024   CO2 23 01/29/2024   GLUCOSE 87 01/29/2024   BUN 19 01/29/2024   CREATININE 0.72 01/29/2024   BILITOT 0.7 12/19/2023   ALKPHOS 53 12/19/2023   AST 19 12/19/2023   ALT 9 12/19/2023   PROT 7.4 12/19/2023   ALBUMIN 3.7 12/19/2023   CALCIUM 9.1 01/29/2024   GFRAA >60 06/03/2020   September 06, 2023 LDL 145, ANA positive, RNP 1.3, (double-stranded DNA, Smith, SSA, SSB negative), protein creatinine ratio 55, RF 116.7, hemoglobin A1c 6.5, TSH normal,  B12 normal, folate normal, vitamin D56.9 Speciality Comments: No specialty comments available.  Procedures:  No procedures performed Allergies: Tramadol   Assessment / Plan:     Visit Diagnoses: No diagnosis found.  Orders: No orders of the defined types were placed in this encounter.  No orders of the defined types were placed in this encounter.   Face-to-face time spent with patient was *** minutes. Greater than 50% of time was spent in counseling and coordination of care.  Follow-Up Instructions: No  follow-ups on file.   Pollyann Savoy, MD  Note - This record has been created using Animal nutritionist.  Chart creation errors have been sought, but may not always  have been located. Such creation errors do not reflect on  the standard of medical care.

## 2024-02-19 ENCOUNTER — Telehealth: Payer: Self-pay | Admitting: Family Medicine

## 2024-02-19 ENCOUNTER — Ambulatory Visit (INDEPENDENT_AMBULATORY_CARE_PROVIDER_SITE_OTHER): Payer: 59 | Admitting: Family Medicine

## 2024-02-19 ENCOUNTER — Encounter: Payer: Self-pay | Admitting: Family Medicine

## 2024-02-19 VITALS — BP 135/72 | HR 94 | Ht 64.0 in | Wt 183.4 lb

## 2024-02-19 DIAGNOSIS — I10 Essential (primary) hypertension: Secondary | ICD-10-CM

## 2024-02-19 DIAGNOSIS — R1084 Generalized abdominal pain: Secondary | ICD-10-CM

## 2024-02-19 DIAGNOSIS — L819 Disorder of pigmentation, unspecified: Secondary | ICD-10-CM

## 2024-02-19 DIAGNOSIS — R109 Unspecified abdominal pain: Secondary | ICD-10-CM | POA: Insufficient documentation

## 2024-02-19 MED ORDER — AZELAIC ACID 20 % EX CREA: TOPICAL_CREAM | Freq: Two times a day (BID) | CUTANEOUS | 1 refills | Status: DC

## 2024-02-19 MED ORDER — ONDANSETRON HCL 4 MG PO TABS: 4.0000 mg | ORAL_TABLET | Freq: Three times a day (TID) | ORAL | 1 refills | Status: DC | PRN

## 2024-02-19 MED ORDER — BLOOD PRESSURE KIT DEVI: 0 refills | Status: AC

## 2024-02-19 NOTE — Assessment & Plan Note (Signed)
Continue Amlodipine 10 mg once daily, and Chlorthalidone 12.5 mg once daily Continued discussion on DASH diet, low sodium diet and maintain a exercise routine for 150 minutes per week.

## 2024-02-19 NOTE — Assessment & Plan Note (Signed)
Trial Azelaic Acid twice daily. Advise Use broad-spectrum sunscreen (SPF 30+ or higher) every day, even indoors. Gentle Cleanser - Avoid harsh exfoliants that can worsen pigmentation. OTC Vitamin C Serum - Brightens skin and helps fade dark spots.

## 2024-02-19 NOTE — Assessment & Plan Note (Addendum)
Unknown Etiology Abdominal US ordered Can take Zofran 4 mg PRN for nausea symptoms Advise Increase oral fluid intake. Bland diet as tolerated. Avoid fluids that have a lot sugar or caffeine, Avoid spicy or fatty food. Avoid alcohol. Can take OTC tylenol for pain. Follow-up in unable to keep food/fluid down x 24 hours, dizziness, fevers, worsening or persistent symptoms to present to ED or contact primary care provider. Patient verbalizes understanding regarding plan of care and all questions answered.

## 2024-02-19 NOTE — Telephone Encounter (Signed)
Pt. Reports "my doctor wants me on chlorthalidone, is that right?" Confirmed with pharmacy and medication list. Pt. Verbalizes understanding.

## 2024-03-03 ENCOUNTER — Encounter: Payer: 59 | Admitting: Rheumatology

## 2024-03-03 DIAGNOSIS — M170 Bilateral primary osteoarthritis of knee: Secondary | ICD-10-CM

## 2024-03-03 DIAGNOSIS — M2141 Flat foot [pes planus] (acquired), right foot: Secondary | ICD-10-CM

## 2024-03-03 DIAGNOSIS — R768 Other specified abnormal immunological findings in serum: Secondary | ICD-10-CM

## 2024-03-03 DIAGNOSIS — G8929 Other chronic pain: Secondary | ICD-10-CM

## 2024-03-03 DIAGNOSIS — E119 Type 2 diabetes mellitus without complications: Secondary | ICD-10-CM

## 2024-03-03 DIAGNOSIS — I10 Essential (primary) hypertension: Secondary | ICD-10-CM

## 2024-03-05 ENCOUNTER — Ambulatory Visit (HOSPITAL_COMMUNITY)
Admission: RE | Admit: 2024-03-05 | Discharge: 2024-03-05 | Disposition: A | Discharge status: Home / Self Care | Payer: 59 | Source: Ambulatory Visit | Attending: Family Medicine | Admitting: Family Medicine

## 2024-03-05 DIAGNOSIS — R1084 Generalized abdominal pain: Secondary | ICD-10-CM | POA: Insufficient documentation

## 2024-03-13 ENCOUNTER — Other Ambulatory Visit: Payer: Self-pay | Admitting: Family Medicine

## 2024-03-19 ENCOUNTER — Other Ambulatory Visit: Payer: Self-pay | Admitting: Family Medicine

## 2024-03-19 ENCOUNTER — Ambulatory Visit: Payer: Self-pay

## 2024-03-19 DIAGNOSIS — R160 Hepatomegaly, not elsewhere classified: Secondary | ICD-10-CM

## 2024-03-19 NOTE — Telephone Encounter (Addendum)
 Copied from CRM [id number]. Topic: Clinical - Lab/Test Results >> Mar 19, 2024  9:05 AM Marlow Baars wrote: Reason for CRM: French Ana with [facility] called to report U/S Abdomen results. I will transfer her to E2C2 NT   French Ana with [facility] called to report abdominal ultrasound results.   IMPRESSION: 1. 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 Recommend further dedicated characterization with abdominal MRI with and without contrast. Alternatively, follow-up ultrasound in 6 months could be considered.  Call was placed to Clinic Access Line to report results. The representative that answered the phone stated that no clinical staff is available at this time to receive the result and she advised to just sent a message to the clinic.

## 2024-03-19 NOTE — Progress Notes (Signed)
 Please inform patient,  Your imaging showed a small mass in the right side of your liver, measuring about 2.3 centimeters. Based on its appearance, it is most likely a hemangioma, which is a common, non-cancerous (benign) collection of blood vessels in the liver. We will need to order an MRI to further evaluate

## 2024-03-23 ENCOUNTER — Ambulatory Visit: Payer: 59 | Admitting: Rheumatology

## 2024-03-30 ENCOUNTER — Ambulatory Visit: Payer: 59 | Admitting: Rheumatology

## 2024-04-01 ENCOUNTER — Ambulatory Visit (HOSPITAL_COMMUNITY)

## 2024-04-08 ENCOUNTER — Ambulatory Visit (HOSPITAL_COMMUNITY): Admission: RE | Admit: 2024-04-08 | Source: Ambulatory Visit

## 2024-04-13 ENCOUNTER — Other Ambulatory Visit (HOSPITAL_COMMUNITY)

## 2024-04-14 ENCOUNTER — Ambulatory Visit (HOSPITAL_COMMUNITY)
Admission: RE | Admit: 2024-04-14 | Discharge: 2024-04-14 | Disposition: A | Discharge status: Home / Self Care | Source: Ambulatory Visit | Attending: Family Medicine | Admitting: Family Medicine

## 2024-04-14 DIAGNOSIS — R160 Hepatomegaly, not elsewhere classified: Secondary | ICD-10-CM | POA: Insufficient documentation

## 2024-04-14 MED ORDER — GADOBUTROL 1 MMOL/ML IV SOLN
7.0000 mL | Freq: Once | INTRAVENOUS | Status: AC | PRN
  Administered 2024-04-14: 7 mL via INTRAVENOUS

## 2024-04-21 ENCOUNTER — Other Ambulatory Visit: Payer: Self-pay | Admitting: Family Medicine

## 2024-05-22 ENCOUNTER — Ambulatory Visit: Payer: 59 | Admitting: Family Medicine

## 2024-06-22 ENCOUNTER — Ambulatory Visit (INDEPENDENT_AMBULATORY_CARE_PROVIDER_SITE_OTHER)

## 2024-06-22 ENCOUNTER — Ambulatory Visit: Payer: Self-pay

## 2024-06-22 VITALS — BP 147/73 | HR 90 | Ht 63.0 in | Wt 175.0 lb

## 2024-06-22 DIAGNOSIS — R2232 Localized swelling, mass and lump, left upper limb: Secondary | ICD-10-CM | POA: Diagnosis not present

## 2024-06-22 MED ORDER — METHYLPREDNISOLONE 4 MG PO TBPK: ORAL_TABLET | ORAL | 0 refills | Status: DC

## 2024-06-22 NOTE — Telephone Encounter (Signed)
 FYI Only or Action Required?: FYI only for provider.  Patient was last seen in primary care on 02/19/2024 by Terry Wilhelmena Lloyd Hilario, FNP. Called Nurse Triage reporting Hand Pain. Symptoms began several days ago. Interventions attempted: OTC medications: tylenol  and Rest, hydration, or home remedies. Symptoms are: unchanged.  Triage Disposition: See Physician Within 24 Hours  Patient/caregiver understands and will follow disposition?: Yes              Copied from CRM [id number]. Topic: Clinical - Red Word Triage >> Jun 22, 2024  8:27 AM Suzen RAMAN wrote: Red Word that prompted transfer to Nurse Triage: painful hand swelling Reason for Disposition  MODERATE hand swelling (e.g., visible swelling of hand and fingers; pitting edema)  Answer Assessment - Initial Assessment Questions 1. ONSET: When did the pain start?     weekend 2. LOCATION: Where is the pain located?     Left hand 3. PAIN: How bad is the pain? (Scale 1-10; or mild, moderate, severe)   - MILD (1-3): doesn't interfere with normal activities   - MODERATE (4-7): interferes with normal activities (e.g., work or school) or awakens from sleep   - SEVERE (8-10): excruciating pain, unable to use hand at all     4/10 4. WORK OR EXERCISE: Has there been any recent work or exercise that involved this part (i.e., hand or wrist) of the body?     no 5. CAUSE: What do you think is causing the pain?     unknown 6. AGGRAVATING FACTORS: What makes the pain worse? (e.g., using computer)     movement 7. OTHER SYMPTOMS: Do you have any other symptoms? (e.g., neck pain, swelling, rash, numbness, fever)     Red and swelling  Answer Assessment - Initial Assessment Questions 1. ONSET: When did the swelling start? (e.g., minutes, hours, days)     This weekend 2. LOCATION: What part of the hand is swollen?  Are both hands swollen or just one hand?     Left hand 3. SEVERITY: How bad is the swelling? (e.g.,  localized; mild, moderate, severe)   - BALL OR LUMP: small ball or lump   - LOCALIZED: puffy or swollen area or patch of skin   - JOINT SWELLING: swelling of a joint   - MILD: puffiness or mild swelling of fingers or hand   - MODERATE: fingers and hand are swollen   - SEVERE: swelling of entire hand and up into forearm     mod 4. REDNESS: Does the swelling look red or infected?     red 5. PAIN: Is the swelling painful to touch? If Yes, ask: How painful is it?   (Scale 1-10; mild, moderate or severe)     4/10 6. FEVER: Do you have a fever? If Yes, ask: What is it, how was it measured, and when did it start?      no 7. CAUSE: What do you think is causing the hand swelling? (e.g., heat, insect bite, pregnancy, recent injury)     unknonwn 8. MEDICAL HISTORY: Do you have a history of heart failure, kidney disease, liver failure, or cancer?     no 9. RECURRENT SYMPTOM: Have you had hand swelling before? If Yes, ask: When was the last time? What happened that time?     no 10. OTHER SYMPTOMS: Do you have any other symptoms? (e.g., blurred vision, difficulty breathing, headache)       no  Protocols used: Hand and Wrist Pain-A-AH, Hand Swelling-A-AH

## 2024-06-23 NOTE — Progress Notes (Signed)
   Established Patient Office Visit  Subjective   Patient ID: Kim [name], female    DOB: 06/15/1941  Age: 83 y.o. MRN: [id number]  Chief Complaint  Patient presents with   Medical Management of Chronic Issues    Hand pain and swelling     HPI Pain  She reports new onset left wrist pain. was not an injury that may have caused the pain. The pain started yesterday and is staying constant. The pain does not radiate . The pain is described as soreness and throbbing, is moderate in intensity, occurring constantly. Symptoms are worse in the: morning  Aggravating factors: use of left hand/wrist Relieving factors: none.  She has tried acetaminophen  and topical anesthetics with no relief.   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  Constitutional:      Appearance: Normal appearance.   Musculoskeletal:     Right wrist: Normal.     Left wrist: Swelling and tenderness present. Decreased range of motion.     Right hand: Normal.     Left hand: Decreased range of motion. Decreased strength. Normal pulse.   Neurological:     Mental Status: She is alert and oriented to person, place, and time.   Psychiatric:         Mood and Affect: Mood normal.        Thought Content: Thought content normal.      No results found for any visits on 06/22/24.    The ASCVD Risk score (Arnett DK, et al., 2019) failed to calculate for the following reasons:   The 2019 ASCVD risk score is only valid for ages 55 to 31    Assessment & Plan:   Problem List Items Addressed This Visit   None Visit Diagnoses       Localized swelling on left hand    -  Primary   Check labs today for further evaluation.  Medrol  Dosepak added for swelling and inflammation.  Recommend Tylenol  as needed for pain relief.   Relevant Medications   methylPREDNISolone  (MEDROL  DOSEPAK) 4 MG TBPK tablet   Other Relevant Orders   Basic Metabolic Panel (BMET)   Uric acid       Return for as needed.    Leita Longs, FNP

## 2024-06-24 LAB — BASIC METABOLIC PANEL WITH GFR
BUN/Creatinine Ratio: 32 — ABNORMAL HIGH (ref 12–28)
BUN: 24 mg/dL (ref 8–27)
CO2: 22 mmol/L (ref 20–29)
Calcium: 9.4 mg/dL (ref 8.7–10.3)
Chloride: 98 mmol/L (ref 96–106)
Creatinine, Ser: 0.76 mg/dL (ref 0.57–1.00)
Glucose: 117 mg/dL — ABNORMAL HIGH (ref 70–99)
Potassium: 3.9 mmol/L (ref 3.5–5.2)
Sodium: 137 mmol/L (ref 134–144)
eGFR: 79 mL/min/{1.73_m2} (ref >59)

## 2024-06-24 LAB — URIC ACID: Uric Acid: 5.9 mg/dL (ref 3.1–7.9)

## 2024-07-06 ENCOUNTER — Other Ambulatory Visit (HOSPITAL_COMMUNITY): Payer: Self-pay | Admitting: Family Medicine

## 2024-07-06 DIAGNOSIS — Z1231 Encounter for screening mammogram for malignant neoplasm of breast: Secondary | ICD-10-CM

## 2024-07-09 ENCOUNTER — Encounter: Payer: Self-pay | Admitting: Family Medicine

## 2024-07-09 ENCOUNTER — Ambulatory Visit: Admitting: Family Medicine

## 2024-07-09 VITALS — BP 134/81 | HR 87 | Ht 63.0 in | Wt 175.0 lb

## 2024-07-09 DIAGNOSIS — E559 Vitamin D deficiency, unspecified: Secondary | ICD-10-CM | POA: Diagnosis not present

## 2024-07-09 DIAGNOSIS — E538 Deficiency of other specified B group vitamins: Secondary | ICD-10-CM | POA: Diagnosis not present

## 2024-07-09 DIAGNOSIS — L819 Disorder of pigmentation, unspecified: Secondary | ICD-10-CM | POA: Diagnosis not present

## 2024-07-09 DIAGNOSIS — E782 Mixed hyperlipidemia: Secondary | ICD-10-CM | POA: Diagnosis not present

## 2024-07-09 DIAGNOSIS — M0579 Rheumatoid arthritis with rheumatoid factor of multiple sites without organ or systems involvement: Secondary | ICD-10-CM | POA: Diagnosis not present

## 2024-07-09 DIAGNOSIS — E038 Other specified hypothyroidism: Secondary | ICD-10-CM

## 2024-07-09 DIAGNOSIS — E1165 Type 2 diabetes mellitus with hyperglycemia: Secondary | ICD-10-CM | POA: Diagnosis not present

## 2024-07-09 DIAGNOSIS — R7303 Prediabetes: Secondary | ICD-10-CM

## 2024-07-09 DIAGNOSIS — I10 Essential (primary) hypertension: Secondary | ICD-10-CM

## 2024-07-09 MED ORDER — AMLODIPINE BESYLATE 10 MG PO TABS: 10.0000 mg | ORAL_TABLET | Freq: Every day | ORAL | 2 refills | Status: DC

## 2024-07-09 MED ORDER — ACETAMINOPHEN ER 650 MG PO TBCR: 650.0000 mg | EXTENDED_RELEASE_TABLET | Freq: Three times a day (TID) | ORAL | 5 refills | Status: AC | PRN

## 2024-07-09 MED ORDER — SYMBICORT 80-4.5 MCG/ACT IN AERO: 2.0000 | INHALATION_SPRAY | Freq: Two times a day (BID) | RESPIRATORY_TRACT | 3 refills | Status: DC

## 2024-07-09 MED ORDER — AZELAIC ACID 20 % EX CREA: TOPICAL_CREAM | Freq: Two times a day (BID) | CUTANEOUS | 1 refills | Status: AC

## 2024-07-09 MED ORDER — CHLORTHALIDONE 25 MG PO TABS: 12.5000 mg | ORAL_TABLET | Freq: Every day | ORAL | 4 refills | Status: DC

## 2024-07-09 MED ORDER — CHLORTHALIDONE 25 MG PO TABS: 12.5000 mg | ORAL_TABLET | Freq: Every day | ORAL | 2 refills | Status: DC

## 2024-07-09 MED ORDER — ALBUTEROL SULFATE HFA 108 (90 BASE) MCG/ACT IN AERS: 2.0000 | INHALATION_SPRAY | Freq: Four times a day (QID) | RESPIRATORY_TRACT | 0 refills | Status: DC | PRN

## 2024-07-09 MED ORDER — ROSUVASTATIN CALCIUM 10 MG PO TABS: 10.0000 mg | ORAL_TABLET | Freq: Every day | ORAL | 3 refills | Status: DC

## 2024-07-09 MED ORDER — METFORMIN HCL 500 MG PO TABS: 500.0000 mg | ORAL_TABLET | Freq: Every day | ORAL | 1 refills | Status: DC

## 2024-07-09 MED ORDER — AMLODIPINE BESYLATE 10 MG PO TABS: 10.0000 mg | ORAL_TABLET | Freq: Every day | ORAL | 4 refills | Status: DC

## 2024-07-09 MED ORDER — METFORMIN HCL 500 MG PO TABS: 500.0000 mg | ORAL_TABLET | Freq: Every day | ORAL | 3 refills | Status: DC

## 2024-07-09 NOTE — Assessment & Plan Note (Signed)
 Last Hemoglobin A1c:  6.3 Labs: Ordered today, results pending; will follow up accordingly. The patient reports adhering to prescribed medications: Metformin  500 mg once daily   Reviewed non-pharmacological interventions, including a balanced diet rich in lean proteins, healthy fats, whole grains, and high-fiber vegetables. Emphasized reducing refined sugars and processed carbohydrates, and incorporating more fruits, leafy greens, and legumes. Education: Patient was educated on recognizing signs and symptoms of both hypoglycemia and hyperglycemia, and advised to seek emergency care if these symptoms occur. Follow-Up: Scheduled for follow-up in 3-4 months, or sooner if needed. Patient Understanding: The patient verbalized understanding of the care plan, and all questions were answered. Additional Care: Ophthalmology referral was placed. Foot exam results were within normal limits

## 2024-07-09 NOTE — Assessment & Plan Note (Signed)
 Continue Amlodipine  10 mg once daily, and Chlorthalidone  12.5 mg once daily Labs ordered. Discussed with  patient to monitor their blood pressure regularly and maintain a heart-healthy diet rich in fruits, vegetables, whole grains, and low-fat dairy, while reducing sodium intake to less than 2,300 mg per day. Regular physical activity, such as 30 minutes of moderate exercise most days of the week, will help lower blood pressure and improve overall cardiovascular health. Avoiding smoking, limiting alcohol consumption, and managing stress. Take  prescribed medication, & take it as directed and avoid skipping doses. Seek emergency care if your blood pressure is (over 180/100) or you experience chest pain, shortness of breath, or sudden vision changes.Patient verbalizes understanding regarding plan of care and all questions answered.

## 2024-07-09 NOTE — Progress Notes (Signed)
 Established Patient Office Visit   Subjective  Patient ID: Kim [name], female    DOB: Oct 25, 1941  Age: 83 y.o. MRN: [id number]  Chief Complaint  Patient presents with   Hypertension    Follow up    She  has a past medical history of Acid reflux, Arthritis, Back spasm, Carpal tunnel syndrome, Hypertension, and Rheumatoid arthritis (HCC).  HPI Patient presents to the clinic for chronic follow up. For the details of today's visit, please refer to assessment and plan.   Review of Systems  Constitutional:  Negative for chills and fever.  Eyes:  Negative for blurred vision.  Respiratory:  Negative for shortness of breath.   Cardiovascular:  Negative for chest pain.  Neurological:  Negative for dizziness.      Objective:     BP 134/81   Pulse 87   Ht 5' 3 (1.6 m)   Wt 175 lb (79.4 kg)   SpO2 97%   BMI 31.00 kg/m  BP Readings from Last 3 Encounters:  07/09/24 134/81  06/22/24 (!) 147/73  02/19/24 135/72      Physical Exam Vitals reviewed.  Constitutional:      General: She is not in acute distress.    Appearance: Normal appearance. She is not ill-appearing, toxic-appearing or diaphoretic.  HENT:     Head: Normocephalic.  Eyes:     General:        Right eye: No discharge.        Left eye: No discharge.     Conjunctiva/sclera: Conjunctivae normal.  Cardiovascular:     Rate and Rhythm: Normal rate.     Pulses: Normal pulses.     Heart sounds: Normal heart sounds.  Pulmonary:     Effort: Pulmonary effort is normal. No respiratory distress.     Breath sounds: Normal breath sounds.  Abdominal:     General: Bowel sounds are normal.     Palpations: Abdomen is soft.     Tenderness: There is no abdominal tenderness. There is no right CVA tenderness, left CVA tenderness or guarding.  Musculoskeletal:     Cervical back: Normal range of motion.  Skin:    General: Skin is warm and dry.  Neurological:     Mental Status: She is alert.  Psychiatric:        Mood  and Affect: Mood normal.        Behavior: Behavior normal.      No results found for any visits on 07/09/24.  The ASCVD Risk score (Arnett DK, et al., 2019) failed to calculate for the following reasons:   The 2019 ASCVD risk score is only valid for ages 3 to 45    Assessment & Plan:  Prediabetes -     Hemoglobin A1c  Vitamin D  deficiency -     VITAMIN D  25 Hydroxy (Vit-D Deficiency, Fractures)  Vitamin B12 deficiency -     Vitamin B12  Mixed hyperlipidemia -     Lipid panel -     CBC with Differential/Platelet  TSH (thyroid -stimulating hormone deficiency) -     TSH + free T4  Rheumatoid arthritis involving multiple sites with positive rheumatoid factor (HCC) -     Ambulatory referral to Rheumatology  Hyperpigmentation of skin -     Azelaic Acid ; Apply topically 2 (two) times daily. After skin is thoroughly washed and patted dry, gently but thoroughly massage a thin film of azelaic acid  cream into the affected area twice daily, in the morning and evening.  Dispense: 30 g; Refill: 1  Primary hypertension Assessment & Plan: Continue Amlodipine  10 mg once daily, and Chlorthalidone  12.5 mg once daily Labs ordered. Discussed with  patient to monitor their blood pressure regularly and maintain a heart-healthy diet rich in fruits, vegetables, whole grains, and low-fat dairy, while reducing sodium intake to less than 2,300 mg per day. Regular physical activity, such as 30 minutes of moderate exercise most days of the week, will help lower blood pressure and improve overall cardiovascular health. Avoiding smoking, limiting alcohol consumption, and managing stress. Take  prescribed medication, & take it as directed and avoid skipping doses. Seek emergency care if your blood pressure is (over 180/100) or you experience chest pain, shortness of breath, or sudden vision changes.Patient verbalizes understanding regarding plan of care and all questions answered.      Type 2 diabetes  mellitus with hyperglycemia, without long-term current use of insulin (HCC) Assessment & Plan: Last Hemoglobin A1c:  6.3 Labs: Ordered today, results pending; will follow up accordingly. The patient reports adhering to prescribed medications: Metformin  500 mg once daily   Reviewed non-pharmacological interventions, including a balanced diet rich in lean proteins, healthy fats, whole grains, and high-fiber vegetables. Emphasized reducing refined sugars and processed carbohydrates, and incorporating more fruits, leafy greens, and legumes. Education: Patient was educated on recognizing signs and symptoms of both hypoglycemia and hyperglycemia, and advised to seek emergency care if these symptoms occur. Follow-Up: Scheduled for follow-up in 3-4 months, or sooner if needed. Patient Understanding: The patient verbalized understanding of the care plan, and all questions were answered. Additional Care: Ophthalmology referral was placed. Foot exam results were within normal limits  Orders: -     Ambulatory referral to Ophthalmology  Other orders -     amLODIPine  Besylate; Take 1 tablet (10 mg total) by mouth daily.  Dispense: 30 tablet; Refill: 2 -     Chlorthalidone ; Take 0.5 tablets (12.5 mg total) by mouth daily.  Dispense: 30 tablet; Refill: 2 -     Rosuvastatin  Calcium ; Take 1 tablet (10 mg total) by mouth daily.  Dispense: 90 tablet; Refill: 3 -     Symbicort ; Inhale 2 puffs into the lungs 2 (two) times daily.  Dispense: 1 each; Refill: 3 -     Albuterol  Sulfate HFA; Inhale 2 puffs into the lungs every 6 (six) hours as needed.  Dispense: 8 g; Refill: 0 -     Acetaminophen  ER; Take 1 tablet (650 mg total) by mouth every 8 (eight) hours as needed for pain.  Dispense: 60 tablet; Refill: 5 -     metFORMIN  HCl; Take 1 tablet (500 mg total) by mouth daily with breakfast.  Dispense: 90 tablet; Refill: 3    Return in about 4 months (around 11/09/2024), or if symptoms worsen or fail to improve, for  chronic follow-up.   Hilario Kidd Wilhelmena Falter, FNP

## 2024-07-09 NOTE — Patient Instructions (Signed)

## 2024-07-19 ENCOUNTER — Ambulatory Visit: Payer: Self-pay

## 2024-07-24 ENCOUNTER — Ambulatory Visit: Payer: Self-pay

## 2024-07-24 NOTE — Telephone Encounter (Signed)
 Called [facility] to advise them of patient's symptoms & disposition & lack of transportation at this time

## 2024-07-24 NOTE — Telephone Encounter (Signed)
     FYI Only or Action Required?: Action required by provider: clinical question for provider, update on patient condition, and patient wants medication for swelling.  Patient was last seen in primary care on 07/09/2024 by Terry Wilhelmena Lloyd Hilario, FNP.  Called Nurse Triage reporting Leg Swelling.  Symptoms began 2-3 days ago.  Interventions attempted: Nothing.  Symptoms are: gradually worsening.  Triage Disposition: See HCP Within 4 Hours (Or PCP Triage)  Patient/caregiver understands and will follow disposition?: No, wishes to speak to PCP             Copied from CRM #[id number]. Topic: Clinical - Red Word Triage >> Jul 24, 2024 10:10 AM Gustabo D wrote: Patient has swelling in her feet and legs for the last 2 days the fluid pills isn't taking it down. Reason for Disposition  [1] Thigh, calf, or ankle swelling AND [2] bilateral AND [3] 1 side is more swollen  Answer Assessment - Initial Assessment Questions Left foot and leg swelling Right a little bit  No pain X 2-3 days   Patient is advised that the recommendation is to be seen in the next 4 hours and have this evaluated She states she does not have transportation to be able to go anywhere to have this evaluated She denies any other symptoms She just wants her PCP to call something in for the swelling Patient states she is taking fluid pills but it isnt working Was on 2 at one time but only one one now She is advised that if anything worsens to go to the Emergency Room and call 911 if needed for an ambulance to take her to the ER Patient verbalized understanding    1. ONSET: When did the swelling start? (e.g., minutes, hours, days)     2-3 days 2. LOCATION: What part of the leg is swollen?  Are both legs swollen or just one leg?     Both but left much worse than right 3. SEVERITY: How bad is the swelling? (e.g., localized; mild, moderate, severe)     Left worse than right 4. REDNESS: Is there  redness or signs of infection?     No 5. PAIN: Is the swelling painful to touch? If Yes, ask: How painful is it?   (Scale 1-10; mild, moderate or severe)     0 6. FEVER: Do you have a fever? If Yes, ask: What is it, how was it measured, and when did it start?      No 7. CAUSE: What do you think is causing the leg swelling?     unknown 8. MEDICAL HISTORY: Do you have a history of blood clots (e.g., DVT), cancer, heart failure, kidney disease, or liver failure?     No 9. RECURRENT SYMPTOM: Have you had leg swelling before? If Yes, ask: When was the last time? What happened that time?      Yes---on fluid pills 10. OTHER SYMPTOMS: Do you have any other symptoms? (e.g., chest pain, difficulty breathing)       No  Protocols used: Leg Swelling and Edema-A-AH

## 2024-07-28 ENCOUNTER — Inpatient Hospital Stay (HOSPITAL_COMMUNITY)
Admission: EM | Admit: 2024-07-28 | Discharge: 2024-07-30 | DRG: 291 | Disposition: A | Discharge status: Home / Self Care | Attending: Internal Medicine | Admitting: Internal Medicine

## 2024-07-28 ENCOUNTER — Other Ambulatory Visit: Payer: Self-pay

## 2024-07-28 ENCOUNTER — Telehealth: Payer: Self-pay | Admitting: Family Medicine

## 2024-07-28 ENCOUNTER — Encounter (HOSPITAL_COMMUNITY): Payer: Self-pay

## 2024-07-28 ENCOUNTER — Emergency Department (HOSPITAL_COMMUNITY)

## 2024-07-28 ENCOUNTER — Ambulatory Visit (INDEPENDENT_AMBULATORY_CARE_PROVIDER_SITE_OTHER): Admitting: Orthopedic Surgery

## 2024-07-28 ENCOUNTER — Encounter: Payer: Self-pay | Admitting: Orthopedic Surgery

## 2024-07-28 VITALS — BP 144/82 | HR 85 | Ht 63.0 in | Wt 182.0 lb

## 2024-07-28 DIAGNOSIS — I5033 Acute on chronic diastolic (congestive) heart failure: Secondary | ICD-10-CM | POA: Diagnosis present

## 2024-07-28 DIAGNOSIS — Z7951 Long term (current) use of inhaled steroids: Secondary | ICD-10-CM | POA: Diagnosis not present

## 2024-07-28 DIAGNOSIS — Z1152 Encounter for screening for COVID-19: Secondary | ICD-10-CM

## 2024-07-28 DIAGNOSIS — M7989 Other specified soft tissue disorders: Secondary | ICD-10-CM | POA: Diagnosis not present

## 2024-07-28 DIAGNOSIS — M069 Rheumatoid arthritis, unspecified: Secondary | ICD-10-CM | POA: Diagnosis not present

## 2024-07-28 DIAGNOSIS — E876 Hypokalemia: Secondary | ICD-10-CM | POA: Diagnosis present

## 2024-07-28 DIAGNOSIS — E119 Type 2 diabetes mellitus without complications: Secondary | ICD-10-CM | POA: Diagnosis not present

## 2024-07-28 DIAGNOSIS — R918 Other nonspecific abnormal finding of lung field: Secondary | ICD-10-CM | POA: Diagnosis not present

## 2024-07-28 DIAGNOSIS — R0989 Other specified symptoms and signs involving the circulatory and respiratory systems: Secondary | ICD-10-CM | POA: Diagnosis not present

## 2024-07-28 DIAGNOSIS — J810 Acute pulmonary edema: Secondary | ICD-10-CM

## 2024-07-28 DIAGNOSIS — Z8249 Family history of ischemic heart disease and other diseases of the circulatory system: Secondary | ICD-10-CM

## 2024-07-28 DIAGNOSIS — I10 Essential (primary) hypertension: Secondary | ICD-10-CM | POA: Diagnosis present

## 2024-07-28 DIAGNOSIS — K219 Gastro-esophageal reflux disease without esophagitis: Secondary | ICD-10-CM | POA: Diagnosis present

## 2024-07-28 DIAGNOSIS — Z79899 Other long term (current) drug therapy: Secondary | ICD-10-CM | POA: Diagnosis not present

## 2024-07-28 DIAGNOSIS — I517 Cardiomegaly: Secondary | ICD-10-CM | POA: Diagnosis not present

## 2024-07-28 DIAGNOSIS — I509 Heart failure, unspecified: Secondary | ICD-10-CM | POA: Diagnosis not present

## 2024-07-28 DIAGNOSIS — I110 Hypertensive heart disease with heart failure: Principal | ICD-10-CM | POA: Diagnosis present

## 2024-07-28 DIAGNOSIS — Z7984 Long term (current) use of oral hypoglycemic drugs: Secondary | ICD-10-CM

## 2024-07-28 DIAGNOSIS — R600 Localized edema: Principal | ICD-10-CM

## 2024-07-28 DIAGNOSIS — M79605 Pain in left leg: Secondary | ICD-10-CM | POA: Diagnosis not present

## 2024-07-28 DIAGNOSIS — R0602 Shortness of breath: Secondary | ICD-10-CM | POA: Diagnosis not present

## 2024-07-28 DIAGNOSIS — Z823 Family history of stroke: Secondary | ICD-10-CM | POA: Diagnosis not present

## 2024-07-28 LAB — CBC WITH DIFFERENTIAL/PLATELET
Abs Immature Granulocytes: 0.01 K/uL (ref 0.00–0.07)
Basophils Absolute: 0.1 K/uL (ref 0.0–0.1)
Basophils Relative: 1 %
Eosinophils Absolute: 0.2 K/uL (ref 0.0–0.5)
Eosinophils Relative: 3 %
HCT: 42 % (ref 36.0–46.0)
Hemoglobin: 13.6 g/dL (ref 12.0–15.0)
Immature Granulocytes: 0 %
Lymphocytes Relative: 44 %
Lymphs Abs: 2.6 K/uL (ref 0.7–4.0)
MCH: 28.8 pg (ref 26.0–34.0)
MCHC: 32.4 g/dL (ref 30.0–36.0)
MCV: 89 fL (ref 80.0–100.0)
Monocytes Absolute: 0.5 K/uL (ref 0.1–1.0)
Monocytes Relative: 9 %
Neutro Abs: 2.6 K/uL (ref 1.7–7.7)
Neutrophils Relative %: 43 %
Platelets: 254 K/uL (ref 150–400)
RBC: 4.72 MIL/uL (ref 3.87–5.11)
RDW: 14.2 % (ref 11.5–15.5)
WBC: 6 K/uL (ref 4.0–10.5)
nRBC: 0 % (ref 0.0–0.2)

## 2024-07-28 LAB — COMPREHENSIVE METABOLIC PANEL WITH GFR
ALT: 10 U/L (ref 0–44)
AST: 21 U/L (ref 15–41)
Albumin: 3.6 g/dL (ref 3.5–5.0)
Alkaline Phosphatase: 56 U/L (ref 38–126)
Anion gap: 8 (ref 5–15)
BUN: 20 mg/dL (ref 8–23)
CO2: 25 mmol/L (ref 22–32)
Calcium: 8.9 mg/dL (ref 8.9–10.3)
Chloride: 103 mmol/L (ref 98–111)
Creatinine, Ser: 0.66 mg/dL (ref 0.44–1.00)
GFR, Estimated: >60 mL/min (ref >60)
Glucose, Bld: 91 mg/dL (ref 70–99)
Potassium: 3.1 mmol/L — ABNORMAL LOW (ref 3.5–5.1)
Sodium: 136 mmol/L (ref 135–145)
Total Bilirubin: 0.6 mg/dL (ref 0.0–1.2)
Total Protein: 7.6 g/dL (ref 6.5–8.1)

## 2024-07-28 LAB — BRAIN NATRIURETIC PEPTIDE: B Natriuretic Peptide: 107 pg/mL — ABNORMAL HIGH (ref 0.0–100.0)

## 2024-07-28 LAB — TROPONIN I (HIGH SENSITIVITY): Troponin I (High Sensitivity): 7 ng/L (ref <18)

## 2024-07-28 LAB — GLUCOSE, CAPILLARY: Glucose-Capillary: 86 mg/dL (ref 70–99)

## 2024-07-28 LAB — MAGNESIUM: Magnesium: 1.9 mg/dL (ref 1.7–2.4)

## 2024-07-28 MED ORDER — AMLODIPINE BESYLATE 5 MG PO TABS
10.0000 mg | ORAL_TABLET | Freq: Every day | ORAL | Status: DC
  Administered 2024-07-29 – 2024-07-30 (×2): 10 mg via ORAL
  Filled 2024-07-28 (×2): qty 2

## 2024-07-28 MED ORDER — ALBUTEROL SULFATE HFA 108 (90 BASE) MCG/ACT IN AERS: 1.0000 | INHALATION_SPRAY | RESPIRATORY_TRACT | Status: DC | PRN

## 2024-07-28 MED ORDER — ONDANSETRON HCL 4 MG/2ML IJ SOLN: 4.0000 mg | Freq: Four times a day (QID) | INTRAMUSCULAR | Status: DC | PRN

## 2024-07-28 MED ORDER — FUROSEMIDE 10 MG/ML IJ SOLN
40.0000 mg | Freq: Once | INTRAMUSCULAR | Status: AC
  Administered 2024-07-28: 40 mg via INTRAVENOUS
  Filled 2024-07-28: qty 4

## 2024-07-28 MED ORDER — POTASSIUM CHLORIDE CRYS ER 20 MEQ PO TBCR
40.0000 meq | EXTENDED_RELEASE_TABLET | Freq: Once | ORAL | Status: AC
  Administered 2024-07-28: 40 meq via ORAL
  Filled 2024-07-28: qty 2

## 2024-07-28 MED ORDER — ENOXAPARIN SODIUM 40 MG/0.4ML IJ SOSY
40.0000 mg | PREFILLED_SYRINGE | INTRAMUSCULAR | Status: DC
  Administered 2024-07-28 – 2024-07-29 (×2): 40 mg via SUBCUTANEOUS
  Filled 2024-07-28 (×2): qty 0.4

## 2024-07-28 MED ORDER — ROSUVASTATIN CALCIUM 10 MG PO TABS
10.0000 mg | ORAL_TABLET | Freq: Every day | ORAL | Status: DC
Start: 2024-07-28 — End: 2024-07-30
  Administered 2024-07-28 – 2024-07-29 (×2): 10 mg via ORAL
  Filled 2024-07-28 (×2): qty 1

## 2024-07-28 MED ORDER — INSULIN ASPART 100 UNIT/ML IJ SOLN
0.0000 [IU] | Freq: Three times a day (TID) | INTRAMUSCULAR | Status: DC
  Administered 2024-07-30: 1 [IU] via SUBCUTANEOUS

## 2024-07-28 MED ORDER — ACETAMINOPHEN 325 MG PO TABS: 650.0000 mg | ORAL_TABLET | Freq: Four times a day (QID) | ORAL | Status: DC | PRN

## 2024-07-28 MED ORDER — ACETAMINOPHEN 650 MG RE SUPP
650.0000 mg | Freq: Four times a day (QID) | RECTAL | Status: DC | PRN
Start: 2024-07-28 — End: 2024-07-30

## 2024-07-28 MED ORDER — POLYETHYLENE GLYCOL 3350 17 G PO PACK: 17.0000 g | PACK | Freq: Every day | ORAL | Status: DC | PRN

## 2024-07-28 MED ORDER — ONDANSETRON HCL 4 MG PO TABS: 4.0000 mg | ORAL_TABLET | Freq: Four times a day (QID) | ORAL | Status: DC | PRN

## 2024-07-28 MED ORDER — FUROSEMIDE 10 MG/ML IJ SOLN
40.0000 mg | Freq: Two times a day (BID) | INTRAMUSCULAR | Status: DC
  Administered 2024-07-29 – 2024-07-30 (×3): 40 mg via INTRAVENOUS
  Filled 2024-07-28 (×3): qty 4

## 2024-07-28 MED ORDER — INSULIN ASPART 100 UNIT/ML IJ SOLN: 0.0000 [IU] | Freq: Every day | INTRAMUSCULAR | Status: DC

## 2024-07-28 NOTE — Assessment & Plan Note (Signed)
 Not on medication

## 2024-07-28 NOTE — Assessment & Plan Note (Signed)
 Stable. -Resume Norvasc  -Holding chlorthalidone  while on Lasix 

## 2024-07-28 NOTE — Assessment & Plan Note (Signed)
 Controlled.  A1c 01/29/2024- 6.3. - SSI- S - Hold home metformin 

## 2024-07-28 NOTE — ED Triage Notes (Signed)
 Pt arrived via POV c/o bilateral lower extremity swelling. Pt reports swelling has been getting worse since last week, and present with left swelling worse than on the right. Pt denies CHF and reports she does not take a diuretic. Pt does endorse mild SOB.

## 2024-07-28 NOTE — Assessment & Plan Note (Addendum)
 Potassium 3.1.   Mag 1.9.  On chlorthalidone .

## 2024-07-28 NOTE — Progress Notes (Signed)
 Orthopaedic Clinic Return  Assessment: Kim [name] is a 83 y.o. female with the following: Bilateral lower extremity pitting edema   Plan: Kim [name] has swelling and pitting edema in bilateral lower extremities.  Left slightly worse than the right.  This extends to the mid tibia area.  I have urged her to discuss this with her primary care provider.  She is takes amlodipine , which could be contributing.  Anticipate that this may need to be treated with some medicines.  She can also consider compression stockings.  We have attempted to contact her primary care provider directly, but the patient's family has elected to take them directly to clinic.  Follow-up: Return if symptoms worsen or fail to improve.   Subjective:  Chief Complaint  Patient presents with   Edema    Swelling legs left worse than right/ states she has called her primary care already but has not heard back     History of Present Illness: Kim [name] is a 83 y.o. female who returns to clinic for evaluation of bilateral lower extremity swelling.  She states that she noticed progressively worsening swelling in both legs.  Left is worse than right.  Previously had surgery on her left foot.  She takes amlodipine .  She does not take anything for fluid retention.  She has not tried a compression stocking.  Review of Systems: No fevers or chills No numbness or tingling No chest pain No shortness of breath No bowel or bladder dysfunction No GI distress No headaches   Objective: BP (!) 144/82   Pulse 85   Ht 5' 3 (1.6 m)   Wt 182 lb (82.6 kg)   BMI 32.24 kg/m   Physical Exam:  Alert and oriented.  No acute distress.  Ambulates with the assistance of a cane.  Swelling in both feet, extending proximally into the mid tibia area.  Pitting edema to this level.  Sensation is intact otherwise.  No concern for an infection.  Blisters or lesions.  IMAGING: I personally ordered and reviewed the following  images:  No new imaging obtained today.  Kim DELENA Horde, MD 07/28/2024 11:38 AM

## 2024-07-28 NOTE — H&P (Signed)
 History and Physical    Rut Betterton FMW:[id number] DOB: 11/18/1941 DOA: 07/28/2024  PCP: Terry Wilhelmena Lloyd Hilario, FNP   Patient coming from: Home  I have personally briefly reviewed patient's old medical records in [facility]  Chief Complaint: Leg swelling, difficulty breathing  HPI: Kim [name] is a 83 y.o. female with medical history significant for hypertension, diabetes mellitus, rheumatoid arthritis. Patient presented to the ED with complaints of bilateral lower extremity swelling of about 1 week duration.  She reports mild difficulty breathing with exertion.  No chest pain.  She used to be on diuretics in the past.  She does not check her weights regularly. She went to see a orthopedist today and was referred to the ED.  ED Course: O2 sats greater than 96% on room air.  Stable vitals.  BNP 107.  Troponin 7.  Potassium 3.1. Chest x-ray with cardiomegaly, bilateral vascular congestion, small pleural effusion/atelectasis. Lateral lower extremity venous Dopplers negative.  Review of Systems: As per HPI all other systems reviewed and negative.  Past Medical History:  Diagnosis Date   Acid reflux    Arthritis    Back spasm    Carpal tunnel syndrome    Hypertension    Rheumatoid arthritis [facility])     Past Surgical History:  Procedure Laterality Date   KNEE SURGERY     ORIF TOE FRACTURE Left 02/27/2021   Procedure: OPEN REDUCTION INTERNAL FIXATION (ORIF) METATARSAL (TOE) FRACTURE;  Surgeon: Onesimo Oneil LABOR, MD;  Location: [facility];  Service: Orthopedics;  Laterality: Left;  ORIF of 1st and 2nd metatarsal fractures   TUBAL LIGATION       reports that she has never smoked. She has never been exposed to tobacco smoke. She has never used smokeless tobacco. She reports that she does not drink alcohol and does not use drugs.  Allergies  Allergen Reactions   Tramadol Hives and Nausea And Vomiting    Family History  Problem Relation Age of Onset   Heart failure Mother     Stroke Mother    Heart failure Father    Stroke Father    Hypertension Sister     Prior to Admission medications   Medication Sig Start Date End Date Taking? Authorizing Provider  acetaminophen  (TYLENOL  8 HOUR ARTHRITIS PAIN) 650 MG CR tablet Take 1 tablet (650 mg total) by mouth every 8 (eight) hours as needed for pain. Patient taking differently: Take 650 mg by mouth every morning. 07/09/24  Yes Del Wilhelmena Lloyd, Hilario, FNP  albuterol  (VENTOLIN  HFA) 108 (90 Base) MCG/ACT inhaler Inhale 2 puffs into the lungs every 6 (six) hours as needed. Patient taking differently: Inhale 2 puffs into the lungs every 6 (six) hours as needed for wheezing or shortness of breath. 07/09/24  Yes Del Orbe Polanco, Hilario, FNP  amLODipine  (NORVASC ) 10 MG tablet Take 1 tablet (10 mg total) by mouth daily. 07/09/24  Yes Del Wilhelmena Lloyd, Hilario, FNP  azelaic acid  (AZELEX ) 20 % cream Apply topically 2 (two) times daily. After skin is thoroughly washed and patted dry, gently but thoroughly massage a thin film of azelaic acid  cream into the affected area twice daily, in the morning and evening. 07/09/24  Yes Del Orbe Polanco, Hilario, FNP  chlorthalidone  (HYGROTON ) 25 MG tablet Take 0.5 tablets (12.5 mg total) by mouth daily. 07/09/24  Yes Del Orbe Polanco, Hilario, FNP  Cholecalciferol (VITAMIN D3) 50 MCG (2000 UT) capsule Take 2,000 Units by mouth daily.   Yes [provider]  metFORMIN  (GLUCOPHAGE ) 500 MG tablet Take 1 tablet (500 mg total) by mouth daily with breakfast. 07/09/24  Yes Del Orbe Polanco, Iliana, FNP  rosuvastatin  (CRESTOR ) 10 MG tablet Take 1 tablet (10 mg total) by mouth daily. Patient taking differently: Take 10 mg by mouth at bedtime. 07/09/24  Yes Del Wilhelmena Falter, Hilario, FNP  Blood Pressure Monitoring (BLOOD PRESSURE KIT) DEVI Take blood pressure reading once daily. 02/19/24   Del Wilhelmena Falter Hilario, FNP  Spacer/Aero-Holding Chambers (AEROCHAMBER PLUS) inhaler Use with inhaler 01/30/22    Van Knee, MD  SYMBICORT  80-4.5 MCG/ACT inhaler Inhale 2 puffs into the lungs 2 (two) times daily. Patient not taking: Reported on 07/28/2024 07/09/24   Terry Wilhelmena Falter Hilario, FNP    Physical Exam: Vitals:   07/28/24 1304 07/28/24 1305 07/28/24 1600  BP: (!) 140/100  126/79  Pulse: 93  79  Resp: 17    Temp: 98 F (36.7 C)    TempSrc: Oral    SpO2: 99%  97%  Weight:  82.6 kg   Height:  5' 3 (1.6 m)     Constitutional: NAD, calm, comfortable Vitals:   07/28/24 1304 07/28/24 1305 07/28/24 1600  BP: (!) 140/100  126/79  Pulse: 93  79  Resp: 17    Temp: 98 F (36.7 C)    TempSrc: Oral    SpO2: 99%  97%  Weight:  82.6 kg   Height:  5' 3 (1.6 m)    Eyes: PERRL, lids and conjunctivae normal ENMT: Mucous membranes are moist.  Neck: normal, supple, no masses, no thyromegaly Respiratory: clear to auscultation bilaterally, no wheezing, no crackles. Normal respiratory effort. No accessory muscle use.  Cardiovascular: Regular rate and rhythm, no murmurs / rubs / gallops.  1+ pitting bilateral lower extremity edema worse on the left. Extremities warm Abdomen: no tenderness, no masses palpated. No hepatosplenomegaly.  Musculoskeletal: no clubbing / cyanosis.  Deformity of hands consistent with rheumatoid arthritis hx.    Skin: no rashes, lesions, ulcers. No induration Neurologic: No facial asymmetry, moving extremity spontaneously, speech fluent. Psychiatric: Normal judgment and insight. Alert and oriented x 3. Normal mood.   Labs on Admission: I have personally reviewed following labs and imaging studies  CBC: Recent Labs  Lab 07/28/24 1328  WBC 6.0  NEUTROABS 2.6  HGB 13.6  HCT 42.0  MCV 89.0  PLT 254   Basic Metabolic Panel: Recent Labs  Lab 07/28/24 1328  NA 136  K 3.1*  CL 103  CO2 25  GLUCOSE 91  BUN 20  CREATININE 0.66  CALCIUM  8.9  MG 1.9   GFR: Estimated Creatinine Clearance: 55.2 mL/min (by C-G formula based on SCr of 0.66 mg/dL). Liver  Function Tests: Recent Labs  Lab 07/28/24 1328  AST 21  ALT 10  ALKPHOS 56  BILITOT 0.6  PROT 7.6  ALBUMIN 3.6   Urine analysis:    Component Value Date/Time   COLORURINE YELLOW 12/19/2023 2022   APPEARANCEUR CLEAR 12/19/2023 2022   LABSPEC 1.016 12/19/2023 2022   PHURINE 6.0 12/19/2023 2022   GLUCOSEU NEGATIVE 12/19/2023 2022   HGBUR NEGATIVE 12/19/2023 2022   BILIRUBINUR NEGATIVE 12/19/2023 2022   BILIRUBINUR negative 01/09/2021 1051   KETONESUR NEGATIVE 12/19/2023 2022   PROTEINUR NEGATIVE 12/19/2023 2022   UROBILINOGEN 2.0 (A) 01/09/2021 1051   UROBILINOGEN 0.2 02/29/2012 1200   NITRITE NEGATIVE 12/19/2023 2022   LEUKOCYTESUR NEGATIVE 12/19/2023 2022    Radiological Exams on Admission: US  Venous Img Lower Bilateral (DVT) Result Date: 07/28/2024  CLINICAL DATA:  Pain and swelling EXAM: BILATERAL LOWER EXTREMITY VENOUS DOPPLER ULTRASOUND TECHNIQUE: Gray-scale sonography with graded compression, as well as color Doppler and duplex ultrasound were performed to evaluate the lower extremity deep venous systems from the level of the common femoral vein and including the common femoral, femoral, profunda femoral, popliteal and calf veins including the posterior tibial, peroneal and gastrocnemius veins when visible. The superficial great saphenous vein was also interrogated. Spectral Doppler was utilized to evaluate flow at rest and with distal augmentation maneuvers in the common femoral, femoral and popliteal veins. COMPARISON:  None Available. FINDINGS: RIGHT LOWER EXTREMITY Common Femoral Vein: No evidence of thrombus. Normal compressibility, respiratory phasicity and response to augmentation. Saphenofemoral Junction: No evidence of thrombus. Normal compressibility and flow on color Doppler imaging. Profunda Femoral Vein: No evidence of thrombus. Normal compressibility and flow on color Doppler imaging. Femoral Vein: No evidence of thrombus. Normal compressibility, respiratory phasicity  and response to augmentation. Popliteal Vein: No evidence of thrombus. Normal compressibility, respiratory phasicity and response to augmentation. Calf Veins: No evidence of thrombus. Normal compressibility and flow on color Doppler imaging. Superficial Great Saphenous Vein: No evidence of thrombus. Normal compressibility. Venous Reflux:  None. Other Findings:  None. LEFT LOWER EXTREMITY Common Femoral Vein: No evidence of thrombus. Normal compressibility, respiratory phasicity and response to augmentation. Saphenofemoral Junction: No evidence of thrombus. Normal compressibility and flow on color Doppler imaging. Profunda Femoral Vein: No evidence of thrombus. Normal compressibility and flow on color Doppler imaging. Femoral Vein: No evidence of thrombus. Normal compressibility, respiratory phasicity and response to augmentation. Popliteal Vein: No evidence of thrombus. Normal compressibility, respiratory phasicity and response to augmentation. Calf Veins: No evidence of thrombus. Normal compressibility and flow on color Doppler imaging. Superficial Great Saphenous Vein: No evidence of thrombus. Normal compressibility. Venous Reflux:  None. Other Findings:  None. IMPRESSION: No evidence of deep venous thrombosis in either lower extremity. Electronically Signed   By: Greig Pique M.D.   On: 07/28/2024 16:04   DG Chest 2 View Result Date: 07/28/2024 CLINICAL DATA:  Shortness of breath EXAM: CHEST - 2 VIEW COMPARISON:  December 19, 2023 chest CT FINDINGS: The heart size is borderline normal. Prominent bilateral hilar and vascular congestion. Mediastinal contours are within normal limits. Blunting of left costophrenic angle increased to prior. The visualized skeletal structures are unremarkable. Degenerative changes of the spine IMPRESSION: Mild cardiomegaly and bilateral vascular congestion. Blunting of the left costophrenic angle suggestive of small pleural effusion/atelectasis, new to prior. Electronically Signed    By: Megan  Zare M.D.   On: 07/28/2024 13:45   EKG: Independently reviewed.  Sinus rhythm, rate 88, QTc 478.  Supraventricular and ventricular PVCs.  Assessment/Plan Principal Problem:   Acute congestive heart failure (HCC) Active Problems:   Hypertension   Type 2 diabetes mellitus (HCC)   Rheumatoid arthritis (HCC)  Assessment and Plan: * Acute congestive heart failure (HCC) Type as yet unspecified.  Presenting with 1+ bilateral lower extremity edema, dyspnea on exertion.  Not hypoxic.  Chest x-ray with bilateral vascular congestion, cardiomegaly.  BNP 107-elevated from baseline last checked 4 years ago.  Last echo 03/2023 EF of 60 to 65% indeterminate LV diastolic parameters. -IV Lasix  40 twice daily -Obtain updated echocardiogram -Input output, daily weights, daily BMP  Hypokalemia Potassium 3.1.   Mag 1.9.  On chlorthalidone .  Rheumatoid arthritis (HCC) Not on medication.  Type 2 diabetes mellitus (HCC) Controlled.  A1c 01/29/2024- 6.3. - SSI- S - Hold home metformin   Hypertension Stable. -  Resume Norvasc  -Holding chlorthalidone  while on Lasix     DVT prophylaxis: Lovenox  Code Status: FULL Family Communication: None at bedside Disposition Plan: ~ 2 days Consults called: None  Admission status:  Obs tele   Author: Tully FORBES Carwin, MD 07/28/2024 8:06 PM  For on call review [email or web address].

## 2024-07-28 NOTE — Assessment & Plan Note (Signed)
 Type as yet unspecified.  Presenting with 1+ bilateral lower extremity edema, dyspnea on exertion.  Not hypoxic.  Chest x-ray with bilateral vascular congestion, cardiomegaly.  BNP 107-elevated from baseline last checked 4 years ago.  Last echo 03/2023 EF of 60 to 65% indeterminate LV diastolic parameters. -IV Lasix  40 twice daily -Obtain updated echocardiogram -Input output, daily weights, daily BMP

## 2024-07-28 NOTE — Telephone Encounter (Signed)
 Please advise what patient to do. Patient was seen at [facility] today , he recommend to come see her pcp tomorrow for extreme swelling bilateral legs concern if kidney or high blood pressure

## 2024-07-28 NOTE — Telephone Encounter (Signed)
 FYI, Patient at the ER

## 2024-07-28 NOTE — Telephone Encounter (Signed)
 Patient went to the ER

## 2024-07-28 NOTE — ED Provider Notes (Signed)
 [place] EMERGENCY DEPARTMENT AT [facility] Provider Note   CSN: [id number] Arrival date & time: 07/28/24  1234     Patient presents with: Leg Swelling   Kim [name] is a 83 y.o. female.   Patient is an 83 year old female who presents to the emergency department with a chief complaint of increased swelling to bilateral lower extremities as well as exertional dyspnea.  She denies any associated chest pain.  She denies any abdominal pain, nausea, vomiting, diarrhea.  She has had no associated dizziness, lightheadedness or syncope.  She denies any recent falls or blunt lower extremity trauma.  She notes that she was previously on Lasix  but is not currently on it at this time.        Prior to Admission medications   Medication Sig Start Date End Date Taking? Authorizing Provider  acetaminophen  (TYLENOL  8 HOUR ARTHRITIS PAIN) 650 MG CR tablet Take 1 tablet (650 mg total) by mouth every 8 (eight) hours as needed for pain. Patient taking differently: Take 650 mg by mouth every morning. 07/09/24  Yes Del Wilhelmena Falter, Hilario, FNP  albuterol  (VENTOLIN  HFA) 108 (90 Base) MCG/ACT inhaler Inhale 2 puffs into the lungs every 6 (six) hours as needed. Patient taking differently: Inhale 2 puffs into the lungs every 6 (six) hours as needed for wheezing or shortness of breath. 07/09/24  Yes Del Orbe Polanco, Hilario, FNP  amLODipine  (NORVASC ) 10 MG tablet Take 1 tablet (10 mg total) by mouth daily. 07/09/24  Yes Del Wilhelmena Falter, Hilario, FNP  azelaic acid  (AZELEX ) 20 % cream Apply topically 2 (two) times daily. After skin is thoroughly washed and patted dry, gently but thoroughly massage a thin film of azelaic acid  cream into the affected area twice daily, in the morning and evening. 07/09/24  Yes Del Orbe Polanco, Hilario, FNP  chlorthalidone  (HYGROTON ) 25 MG tablet Take 0.5 tablets (12.5 mg total) by mouth daily. 07/09/24  Yes Del Orbe Polanco, Hilario, FNP  Cholecalciferol (VITAMIN D3) 50 MCG  (2000 UT) capsule Take 2,000 Units by mouth daily.   Yes [provider]  metFORMIN  (GLUCOPHAGE ) 500 MG tablet Take 1 tablet (500 mg total) by mouth daily with breakfast. 07/09/24  Yes Del Orbe Polanco, Iliana, FNP  rosuvastatin  (CRESTOR ) 10 MG tablet Take 1 tablet (10 mg total) by mouth daily. Patient taking differently: Take 10 mg by mouth at bedtime. 07/09/24  Yes Del Wilhelmena Falter, Hilario, FNP  Blood Pressure Monitoring (BLOOD PRESSURE KIT) DEVI Take blood pressure reading once daily. 02/19/24   Del Wilhelmena Falter Hilario, FNP  Spacer/Aero-Holding Chambers (AEROCHAMBER PLUS) inhaler Use with inhaler 01/30/22   Van Knee, MD  SYMBICORT  80-4.5 MCG/ACT inhaler Inhale 2 puffs into the lungs 2 (two) times daily. Patient not taking: Reported on 07/28/2024 07/09/24   Del Orbe Polanco, Iliana, FNP    Allergies: Tramadol    Review of Systems  Respiratory:  Positive for shortness of breath.   Cardiovascular:  Positive for leg swelling.  All other systems reviewed and are negative.   Updated Vital Signs BP 126/79   Pulse 79   Temp 98 F (36.7 C) (Oral)   Resp 17   Ht 5' 3 (1.6 m)   Wt 82.6 kg   SpO2 97%   BMI 32.24 kg/m   Physical Exam Vitals and nursing note reviewed.  Constitutional:      Appearance: Normal appearance.  HENT:     Head: Normocephalic and atraumatic.     Nose: Nose normal.  Mouth/Throat:     Mouth: Mucous membranes are moist.  Eyes:     Extraocular Movements: Extraocular movements intact.     Conjunctiva/sclera: Conjunctivae normal.     Pupils: Pupils are equal, round, and reactive to light.  Cardiovascular:     Rate and Rhythm: Normal rate and regular rhythm.     Pulses: Normal pulses.     Heart sounds: Normal heart sounds. No murmur heard.    No gallop.  Pulmonary:     Effort: Pulmonary effort is normal. No respiratory distress.     Breath sounds: No stridor. Rales present. No wheezing or rhonchi.  Abdominal:     General: Abdomen is flat.  Bowel sounds are normal. There is no distension.     Palpations: Abdomen is soft.     Tenderness: There is no abdominal tenderness. There is no guarding.  Musculoskeletal:        General: No tenderness, deformity or signs of injury. Normal range of motion.     Cervical back: Normal range of motion and neck supple.     Right lower leg: Edema present.     Left lower leg: Edema present.  Skin:    General: Skin is warm and dry.     Findings: No bruising or rash.  Neurological:     General: No focal deficit present.     Mental Status: She is alert and oriented to person, place, and time. Mental status is at baseline.  Psychiatric:        Mood and Affect: Mood normal.        Behavior: Behavior normal.        Thought Content: Thought content normal.        Judgment: Judgment normal.     (all labs ordered are listed, but only abnormal results are displayed) Labs Reviewed  BRAIN NATRIURETIC PEPTIDE - Abnormal; Notable for the following components:      Result Value   B Natriuretic Peptide 107.0 (*)    All other components within normal limits  COMPREHENSIVE METABOLIC PANEL WITH GFR - Abnormal; Notable for the following components:   Potassium 3.1 (*)    All other components within normal limits  CBC WITH DIFFERENTIAL/PLATELET  MAGNESIUM   TROPONIN I (HIGH SENSITIVITY)    EKG: None  Radiology: US  Venous Img Lower Bilateral (DVT) Result Date: 07/28/2024 CLINICAL DATA:  Pain and swelling EXAM: BILATERAL LOWER EXTREMITY VENOUS DOPPLER ULTRASOUND TECHNIQUE: Gray-scale sonography with graded compression, as well as color Doppler and duplex ultrasound were performed to evaluate the lower extremity deep venous systems from the level of the common femoral vein and including the common femoral, femoral, profunda femoral, popliteal and calf veins including the posterior tibial, peroneal and gastrocnemius veins when visible. The superficial great saphenous vein was also interrogated. Spectral  Doppler was utilized to evaluate flow at rest and with distal augmentation maneuvers in the common femoral, femoral and popliteal veins. COMPARISON:  None Available. FINDINGS: RIGHT LOWER EXTREMITY Common Femoral Vein: No evidence of thrombus. Normal compressibility, respiratory phasicity and response to augmentation. Saphenofemoral Junction: No evidence of thrombus. Normal compressibility and flow on color Doppler imaging. Profunda Femoral Vein: No evidence of thrombus. Normal compressibility and flow on color Doppler imaging. Femoral Vein: No evidence of thrombus. Normal compressibility, respiratory phasicity and response to augmentation. Popliteal Vein: No evidence of thrombus. Normal compressibility, respiratory phasicity and response to augmentation. Calf Veins: No evidence of thrombus. Normal compressibility and flow on color Doppler imaging. Superficial Great Saphenous Vein: No  evidence of thrombus. Normal compressibility. Venous Reflux:  None. Other Findings:  None. LEFT LOWER EXTREMITY Common Femoral Vein: No evidence of thrombus. Normal compressibility, respiratory phasicity and response to augmentation. Saphenofemoral Junction: No evidence of thrombus. Normal compressibility and flow on color Doppler imaging. Profunda Femoral Vein: No evidence of thrombus. Normal compressibility and flow on color Doppler imaging. Femoral Vein: No evidence of thrombus. Normal compressibility, respiratory phasicity and response to augmentation. Popliteal Vein: No evidence of thrombus. Normal compressibility, respiratory phasicity and response to augmentation. Calf Veins: No evidence of thrombus. Normal compressibility and flow on color Doppler imaging. Superficial Great Saphenous Vein: No evidence of thrombus. Normal compressibility. Venous Reflux:  None. Other Findings:  None. IMPRESSION: No evidence of deep venous thrombosis in either lower extremity. Electronically Signed   By: Greig Pique M.D.   On: 07/28/2024 16:04    DG Chest 2 View Result Date: 07/28/2024 CLINICAL DATA:  Shortness of breath EXAM: CHEST - 2 VIEW COMPARISON:  December 19, 2023 chest CT FINDINGS: The heart size is borderline normal. Prominent bilateral hilar and vascular congestion. Mediastinal contours are within normal limits. Blunting of left costophrenic angle increased to prior. The visualized skeletal structures are unremarkable. Degenerative changes of the spine IMPRESSION: Mild cardiomegaly and bilateral vascular congestion. Blunting of the left costophrenic angle suggestive of small pleural effusion/atelectasis, new to prior. Electronically Signed   By: Megan  Zare M.D.   On: 07/28/2024 13:45     Procedures   Medications Ordered in the ED  potassium chloride  SA (KLOR-CON  M) CR tablet 40 mEq (has no administration in time range)  furosemide  (LASIX ) injection 40 mg (40 mg Intravenous Given 07/28/24 1552)                                    Medical Decision Making Amount and/or Complexity of Data Reviewed Labs: ordered. Radiology: ordered.  Risk Prescription drug management. Decision regarding hospitalization.   This patient presents to the ED for concern of edema, dyspnea, this involves an extensive number of treatment options, and is a complaint that carries with it a high risk of complications and morbidity.  The differential diagnosis includes CHF, COPD, asthma, DVT, PE, ACS   Co morbidities that complicate the patient evaluation  Hypertension   Additional history obtained:  Additional history obtained from family External records from outside source obtained and reviewed including medical records   Lab Tests:  I Ordered, and personally interpreted labs.  The pertinent results include: No leukocytosis, no anemia, mild hypokalemia, normal liver function kidney function, normal magnesium , negative troponin, negative BNP   Imaging Studies ordered:  I ordered imaging studies including chest x-ray, venous duplex  bilateral lower extremities I independently visualized and interpreted imaging which showed cardiomegaly and pulmonary edema, no DVT I agree with the radiologist interpretation   Cardiac Monitoring: / EKG:  The patient was maintained on a cardiac monitor.  I personally viewed and interpreted the cardiac monitored which showed an underlying rhythm of: Normal sinus rhythm with PVCs, no ST/T wave changes, no ischemic changes, no STEMI   Consultations Obtained:  I requested consultation with the hospitalist,  and discussed lab and imaging findings as well as pertinent plan - they recommend: Admission   Problem List / ED Course / Critical interventions / Medication management  Patient is doing well at this time and has remained stable.  Discussed with patient we will plan for admission to  the hospital service given her worsening edema and pulmonary vascular congestion.  She was started on Lasix  in the emergency department.  Potassium repletion was started as well.  Patient has not required any supplemental oxygen at this point.  Venous duplex was negative for DVT.  She has no indication for pneumonia.  Do not suspect ACS at this time.  Did discuss patient case with Dr. FORBES Carwin with the hospitalist service who has excepted for admission. I ordered medication including Lasix , potassium for CHF and hypokalemia Reevaluation of the patient after these medicines showed that the patient improved I have reviewed the patients home medicines and have made adjustments as needed   Social Determinants of Health:  None   Test / Admission - Considered:  Admission     Final diagnoses:  Peripheral edema  Acute pulmonary edema [facility])  Hypokalemia    ED Discharge Orders     None          Daralene Lonni JONETTA DEVONNA 07/28/24 1751    Towana Ozell BROCKS, MD 07/29/24 [phone]

## 2024-07-29 ENCOUNTER — Observation Stay (HOSPITAL_COMMUNITY)

## 2024-07-29 DIAGNOSIS — Z1152 Encounter for screening for COVID-19: Secondary | ICD-10-CM | POA: Diagnosis not present

## 2024-07-29 DIAGNOSIS — Z823 Family history of stroke: Secondary | ICD-10-CM | POA: Diagnosis not present

## 2024-07-29 DIAGNOSIS — I10 Essential (primary) hypertension: Secondary | ICD-10-CM | POA: Diagnosis not present

## 2024-07-29 DIAGNOSIS — M069 Rheumatoid arthritis, unspecified: Secondary | ICD-10-CM

## 2024-07-29 DIAGNOSIS — I5033 Acute on chronic diastolic (congestive) heart failure: Secondary | ICD-10-CM

## 2024-07-29 DIAGNOSIS — I509 Heart failure, unspecified: Secondary | ICD-10-CM | POA: Diagnosis present

## 2024-07-29 DIAGNOSIS — E876 Hypokalemia: Secondary | ICD-10-CM | POA: Diagnosis present

## 2024-07-29 DIAGNOSIS — Z79899 Other long term (current) drug therapy: Secondary | ICD-10-CM | POA: Diagnosis not present

## 2024-07-29 DIAGNOSIS — E119 Type 2 diabetes mellitus without complications: Secondary | ICD-10-CM | POA: Diagnosis present

## 2024-07-29 DIAGNOSIS — Z8249 Family history of ischemic heart disease and other diseases of the circulatory system: Secondary | ICD-10-CM | POA: Diagnosis not present

## 2024-07-29 DIAGNOSIS — K219 Gastro-esophageal reflux disease without esophagitis: Secondary | ICD-10-CM | POA: Diagnosis present

## 2024-07-29 DIAGNOSIS — Z7951 Long term (current) use of inhaled steroids: Secondary | ICD-10-CM | POA: Diagnosis not present

## 2024-07-29 DIAGNOSIS — Z7984 Long term (current) use of oral hypoglycemic drugs: Secondary | ICD-10-CM | POA: Diagnosis not present

## 2024-07-29 DIAGNOSIS — I110 Hypertensive heart disease with heart failure: Secondary | ICD-10-CM | POA: Diagnosis present

## 2024-07-29 LAB — BASIC METABOLIC PANEL WITH GFR
Anion gap: 6 (ref 5–15)
BUN: 17 mg/dL (ref 8–23)
CO2: 26 mmol/L (ref 22–32)
Calcium: 8.8 mg/dL — ABNORMAL LOW (ref 8.9–10.3)
Chloride: 103 mmol/L (ref 98–111)
Creatinine, Ser: 0.68 mg/dL (ref 0.44–1.00)
GFR, Estimated: >60 mL/min (ref >60)
Glucose, Bld: 91 mg/dL (ref 70–99)
Potassium: 4 mmol/L (ref 3.5–5.1)
Sodium: 135 mmol/L (ref 135–145)

## 2024-07-29 LAB — GLUCOSE, CAPILLARY
Glucose-Capillary: 98 mg/dL (ref 70–99)
Glucose-Capillary: 91 mg/dL (ref 70–99)
Glucose-Capillary: 83 mg/dL (ref 70–99)
Glucose-Capillary: 116 mg/dL — ABNORMAL HIGH (ref 70–99)

## 2024-07-29 LAB — ECHOCARDIOGRAM COMPLETE
AR max vel: 3.89 cm2
AV Area VTI: 3.74 cm2
AV Area mean vel: 3.7 cm2
AV Mean grad: 1.5 mmHg
AV Peak grad: 3 mmHg
Ao pk vel: 0.87 m/s
Area-P 1/2: 4.71 cm2
Calc EF: 50.5 %
Height: 63 in
MV M vel: 4.35 m/s
MV Peak grad: 75.7 mmHg
S' Lateral: 2.9 cm
Single Plane A2C EF: 56.2 %
Single Plane A4C EF: 46.7 %
Weight: 2797.2 [oz_av]

## 2024-07-29 NOTE — Plan of Care (Signed)
  Problem: Clinical Measurements: Goal: Ability to maintain clinical measurements within normal limits will improve Outcome: Adequate for Discharge Goal: Diagnostic test results will improve Outcome: Adequate for Discharge   

## 2024-07-29 NOTE — Progress Notes (Signed)
*  PRELIMINARY RESULTS* Echocardiogram 2D Echocardiogram has been performed.  Kim [name] Stallion 07/29/2024, 2:18 PM

## 2024-07-29 NOTE — TOC CM/SW Note (Signed)
 Transition of Care [facility]) - Inpatient Brief Assessment   Patient Details  Name: Kim [name] MRN: [id number] Date of Birth: 1941-10-22  Transition of Care [facility]) CM/SW Contact:    Lucie Lunger, LCSWA Phone Number: 07/29/2024, 9:59 AM   Clinical Narrative: Transition of Care Department [facility]) has reviewed patient and no TOC needs have been identified at this time. We will continue to monitor patient advancement through interdiciplinary progression rounds. If new patient transition needs arise, please place a TOC consult.  Transition of Care Asessment: Insurance and Status: Insurance coverage has been reviewed Patient has primary care physician: Yes Home environment has been reviewed: From home Prior level of function:: Independent Prior/Current Home Services: No current home services Social Drivers of Health Review: SDOH reviewed no interventions necessary Readmission risk has been reviewed: Yes Transition of care needs: no transition of care needs at this time

## 2024-07-29 NOTE — Progress Notes (Signed)
  Progress Note   Patient: Kim [name] FMW:[id number] DOB: 1941/08/22 DOA: 07/28/2024     0 DOS: the patient was seen and examined on 07/29/2024   Brief hospital admission narrative: As per H&P written by Dr. Pearlean on 07/28/2024 [name] is a 83 y.o. female with medical history significant for hypertension, diabetes mellitus, rheumatoid arthritis. Patient presented to the ED with complaints of bilateral lower extremity swelling of about 1 week duration.  She reports mild difficulty breathing with exertion.  No chest pain.  She used to be on diuretics in the past.  She does not check her weights regularly. She went to see a orthopedist today and was referred to the ED.   ED Course: O2 sats greater than 96% on room air.  Stable vitals.  BNP 107.  Troponin 7.  Potassium 3.1. Chest x-ray with cardiomegaly, bilateral vascular congestion, small pleural effusion/atelectasis. Lateral lower extremity venous Dopplers negative.  Assessment and plan Acute congestive heart failure (HCC) -Echo pending to identify CHF type - Good response to diuresis management - Given prior history of hypertension and age most likely diastolic in nature; prior 2D echo on file suggesting the presence of moderate left ventricle hypertrophy. - Continue daily weights, low-sodium diet and IV Lasix  - No requiring oxygen supplementation and reporting feeling better.  Rheumatoid arthritis (HCC) -Not on chronic medications - Continue as needed analgesics.  Hypertension -Stable overall - Continue IV diuresis and home dose Norvasc  - Follow echo results.  Type 2 diabetes mellitus (HCC) -Continue holding oral hypoglycemic agents while inpatient - Follow CBG fluctuation - Continue sliding scale insulin .  Hypokalemia -In the setting of diuretics usage - Continue electrolyte repletion - Follow potassium trend - Continue telemetry monitoring. - Magnesium  within normal limits.  Subjective:  No chest pain, no  nausea, no vomiting.  Good saturation appreciated on room air.  No overnight events.  Physical Exam: Vitals:   07/29/24 0237 07/29/24 0500 07/29/24 0542 07/29/24 1318  BP: 111/71  136/61 121/84  Pulse: 77  77 93  Resp: 18  16 18   Temp: 98.2 F (36.8 C)  97.7 F (36.5 C) 98.8 F (37.1 C)  TempSrc: Oral  Oral Oral  SpO2: 99%  99% 95%  Weight:  79.3 kg    Height:       General exam: Alert, awake, oriented x 3 Respiratory system: Decreased breath sounds at the bases; no wheezing.  No using accessory muscle. Cardiovascular system:RRR. No rubs or gallops; no JVD. Gastrointestinal system: Abdomen is nondistended, soft and nontender. No organomegaly or masses felt. Normal bowel sounds heard. Central nervous system: Alert and oriented. No focal neurological deficits. Extremities: No cyanosis or clubbing; trace edema appreciated bilaterally. Skin: No petechiae. Psychiatry: Judgement and insight appear normal. Mood & affect appropriate.    Data Reviewed: Basic metabolic panel: Sodium 135, potassium 4.0, chloride 103, bicarb 26, BUN 17, creatinine 0.68 and GFR >60  Family Communication: Daughter at bedside.  Disposition: Status is: Inpatient Remains inpatient appropriate because: Continue IV diuresis.  Anticipating discharge back home once medically stable. Time spent: 50 minutes  Author: Eric Nunnery, MD 07/29/2024 6:21 PM  For on call review [email or web address].

## 2024-07-30 DIAGNOSIS — E119 Type 2 diabetes mellitus without complications: Secondary | ICD-10-CM | POA: Diagnosis not present

## 2024-07-30 DIAGNOSIS — I10 Essential (primary) hypertension: Secondary | ICD-10-CM | POA: Diagnosis not present

## 2024-07-30 DIAGNOSIS — I5033 Acute on chronic diastolic (congestive) heart failure: Secondary | ICD-10-CM | POA: Diagnosis not present

## 2024-07-30 DIAGNOSIS — M069 Rheumatoid arthritis, unspecified: Secondary | ICD-10-CM | POA: Diagnosis not present

## 2024-07-30 LAB — GLUCOSE, CAPILLARY
Glucose-Capillary: 99 mg/dL (ref 70–99)
Glucose-Capillary: 135 mg/dL — ABNORMAL HIGH (ref 70–99)

## 2024-07-30 LAB — BASIC METABOLIC PANEL WITH GFR
Anion gap: 10 (ref 5–15)
BUN: 21 mg/dL (ref 8–23)
CO2: 25 mmol/L (ref 22–32)
Calcium: 9 mg/dL (ref 8.9–10.3)
Chloride: 100 mmol/L (ref 98–111)
Creatinine, Ser: 0.78 mg/dL (ref 0.44–1.00)
GFR, Estimated: >60 mL/min (ref >60)
Glucose, Bld: 110 mg/dL — ABNORMAL HIGH (ref 70–99)
Potassium: 3.6 mmol/L (ref 3.5–5.1)
Sodium: 135 mmol/L (ref 135–145)

## 2024-07-30 MED ORDER — METFORMIN HCL 500 MG PO TABS: 250.0000 mg | ORAL_TABLET | Freq: Every day | ORAL | Status: AC

## 2024-07-30 MED ORDER — LOSARTAN POTASSIUM 25 MG PO TABS: 25.0000 mg | ORAL_TABLET | Freq: Every day | ORAL | 3 refills | Status: DC

## 2024-07-30 MED ORDER — FUROSEMIDE 40 MG PO TABS: 40.0000 mg | ORAL_TABLET | Freq: Every day | ORAL | 3 refills | Status: DC

## 2024-07-30 MED ORDER — DAPAGLIFLOZIN PROPANEDIOL 10 MG PO TABS: 10.0000 mg | ORAL_TABLET | Freq: Every day | ORAL | 2 refills | Status: DC

## 2024-07-30 NOTE — Plan of Care (Signed)

## 2024-07-30 NOTE — Plan of Care (Signed)
   Problem: Education: Goal: Knowledge of General Education information will improve Description: Including pain rating scale, medication(s)/side effects and non-pharmacologic comfort measures Outcome: Progressing   Problem: Clinical Measurements: Goal: Ability to maintain clinical measurements within normal limits will improve Outcome: Progressing Goal: Diagnostic test results will improve Outcome: Progressing

## 2024-07-30 NOTE — Discharge Summary (Signed)
 Physician Discharge Summary   Patient: Kim [name] MRN: [id number] DOB: 10-22-1941  Admit date:     07/28/2024  Discharge date: 07/30/24  Discharge Physician: Eric Nunnery   PCP: Kim Wilhelmena Lloyd Hilario, FNP   Recommendations at discharge:  Reassess blood pressure and adjust antihypertensive regimen as needed Repeat basic metabolic panel to follow electrolytes and renal function Reassess patient volume status with further adjustment to diuretic therapy as required. Continue to follow CBGs fluctuation and patient's A1c level to further adjust hypoglycemic regimen.  Discharge Diagnoses: Principal Problem:   Acute congestive heart failure (HCC) Active Problems:   Hypokalemia   Hypertension   Type 2 diabetes mellitus (HCC)   Rheumatoid arthritis (HCC)   Acute exacerbation of CHF (congestive heart failure) (HCC)   Acute on chronic diastolic CHF (congestive heart failure) (HCC)  Brief hospital admission narrative: As per H&P written by Dr. Pearlean on 07/28/2024 Kim [name] is a 83 y.o. female with medical history significant for hypertension, diabetes mellitus, rheumatoid arthritis. Patient presented to the ED with complaints of bilateral lower extremity swelling of about 1 week duration.  She reports mild difficulty breathing with exertion.  No chest pain.  She used to be on diuretics in the past.  She does not check her [name] regularly. She went to see a orthopedist today and was referred to the ED.   ED Course: O2 sats greater than 96% on room air.  Stable vitals.  BNP 107.  Troponin 7.  Potassium 3.1. Chest x-ray with cardiomegaly, bilateral vascular congestion, small pleural effusion/atelectasis. Lateral lower extremity venous Dopplers negative.  Assessment and Plan: Acute on chronic diastolic congestive heart failure (HCC) -Echo demonstrating preserved ejection fraction and moderate left ventricle hypertrophy; no wall motion abnormalities or significant valvular  disorder. - Good response to diuresis management - No orthopnea or requiring oxygen supplementation at discharge - Low-sodium diet and daily [name] recommended - [name] , [name] -TED hoses/compression stocking discussed with patient.   Rheumatoid arthritis (HCC) -Not on chronic medications - Continue as needed analgesics.   Hypertension -Stable overall - Continue current antihypertensive regimen - Heart healthy/low-sodium diet discussed with patient.   Type 2 diabetes mellitus (HCC) -Resume home oral hypoglycemic agent along with the use of Farxiga  - Continue to follow CBG fluctuation/A1c and further adjust hypoglycemic management   Hypokalemia -In the setting of diuretics usage - Repleted and within normal limits at discharge - Patient has now been discharged on losartan  for some preservation in her potassium levels while controlling blood pressure. - Repeat basic metabolic panel to assess trend/stability and further replete electrolytes as needed.   Consultants: None Procedures performed: See below for x-ray reports; 2D echo. Disposition: Home Diet recommendation: Heart healthy/modified carbohydrate diet.  DISCHARGE MEDICATION: Allergies as of 07/30/2024       Reactions   Tramadol Hives, Nausea And Vomiting        Medication List     STOP taking these medications    amLODipine  10 MG tablet Commonly known as: NORVASC    chlorthalidone  25 MG tablet Commonly known as: HYGROTON        TAKE these medications    acetaminophen  650 MG CR tablet Commonly known as: Tylenol  8 Hour Arthritis Pain Take 1 tablet (650 mg total) by mouth every 8 (eight) hours as needed for pain. What changed: when to take this   AeroChamber Plus inhaler Use with inhaler   albuterol  108 (90 Base) MCG/ACT  inhaler Commonly known as: VENTOLIN  HFA Inhale 2 puffs into the lungs every 6 (six) hours as  needed. What changed: reasons to take this   azelaic acid  20 % cream Commonly known as: AZELEX  Apply topically 2 (two) times daily. After skin is thoroughly washed and patted dry, gently but thoroughly massage a thin film of azelaic acid  cream into the affected area twice daily, in the morning and evening.   Blood Pressure Kit Devi Take blood pressure reading once daily.   dapagliflozin  propanediol 10 MG Tabs tablet Commonly known as: Farxiga  Take 1 tablet (10 mg total) by mouth daily before breakfast.   furosemide  40 MG tablet Commonly known as: Lasix  Take 1 tablet (40 mg total) by mouth daily.   losartan  25 MG tablet Commonly known as: Cozaar  Take 1 tablet (25 mg total) by mouth daily.   metFORMIN  500 MG tablet Commonly known as: GLUCOPHAGE  Take 0.5 tablets (250 mg total) by mouth daily with breakfast. What changed: how much to take   rosuvastatin  10 MG tablet Commonly known as: Crestor  Take 1 tablet (10 mg total) by mouth daily. What changed: when to take this   Symbicort  80-4.5 MCG/ACT inhaler Generic drug: budesonide-formoterol Inhale 2 puffs into the lungs 2 (two) times daily.   Vitamin D3 50 MCG (2000 UT) capsule Take 2,000 Units by mouth daily.        Follow-up Information     Del Wilhelmena Falter, [name], [place]. Schedule an appointment as soon as possible for a visit in 10 day(s).   Specialty: Family Medicine Contact information: [address] [place] [address] [phone]                Discharge Exam: Kim [name]   07/28/24 1838 07/29/24 0500 07/30/24 0453  Weight: 80.1 kg 79.3 kg 76.9 kg   General exam: Alert, awake, oriented x 3; feeling ready to go home. Respiratory system: Good air movement bilaterally; no wheezing, no crackles. Cardiovascular system:RRR. No murmurs, rubs, gallops. Gastrointestinal system: Abdomen is nondistended, soft and nontender. No organomegaly or masses felt. Normal bowel sounds heard. Central nervous  system: Alert and oriented. No focal neurological deficits. Extremities: No cyanosis or clubbing; trace edema appreciated bilaterally. Skin: No rashes, lesions or ulcers Psychiatry: Judgement and insight appear normal. Mood & affect appropriate.    Condition at discharge: Stable and improved.  The results of significant diagnostics from this hospitalization (including imaging, microbiology, ancillary and laboratory) are listed below for reference.   Imaging Studies: ECHOCARDIOGRAM COMPLETE Result Date: 07/29/2024    ECHOCARDIOGRAM REPORT   Patient Name:   Kim [name] Date of Exam: 07/29/2024 Medical Rec #:  [id number]      Height:       63.0 in Accession #:    [id number]     Weight:       174.8 lb Date of Birth:  1941-10-07      BSA:          1.826 m Patient Age:    83 years       BP:           136/61 mmHg Patient Gender: F              HR:           92 bpm. Exam Location:  [name] Procedure: 2D Echo, Color Doppler and Cardiac Doppler (Both Spectral and Color            Flow Doppler were utilized during  procedure). Indications:    CHF  History:        Patient has prior history of Echocardiogram examinations, most                 recent 03/21/2023. Risk Factors:Hypertension and Diabetes.  Sonographer:    Benard Stallion Referring Phys: [phone] EJIROGHENE E EMOKPAE IMPRESSIONS  1. Left ventricular ejection fraction, by estimation, is 55 to 60%. The left ventricle has normal function. The left ventricle has no regional wall motion abnormalities. There is mild asymmetric left ventricular hypertrophy of the basal segment. Left ventricular diastolic parameters are indeterminate.  2. Right ventricular systolic function is normal. The right ventricular size is normal. There is normal pulmonary artery systolic pressure. The estimated right ventricular systolic pressure is 29.9 mmHg.  3. The mitral valve is degenerative. Mild mitral valve regurgitation.  4. The aortic valve is tricuspid. Aortic valve  regurgitation is not visualized. No aortic stenosis is present. Aortic valve mean gradient measures 1.5 mmHg.  5. The inferior vena cava is normal in size with <50% respiratory variability, suggesting right atrial pressure of 8 mmHg. Comparison(s): Prior images reviewed side by side. LVEF 55-60% with mild basal septal LV hypertrophy. Normal estimated RVSP. Mild mitral and tricuspid regurgitation. FINDINGS  Left Ventricle: Left ventricular ejection fraction, by estimation, is 55 to 60%. The left ventricle has normal function. The left ventricle has no regional wall motion abnormalities. The left ventricular internal cavity size was normal in size. There is  mild asymmetric left ventricular hypertrophy of the basal segment. Left ventricular diastolic parameters are indeterminate. Right Ventricle: The right ventricular size is normal. No increase in right ventricular wall thickness. Right ventricular systolic function is normal. There is normal pulmonary artery systolic pressure. The tricuspid regurgitant velocity is 2.34 m/s, and  with an assumed right atrial pressure of 8 mmHg, the estimated right ventricular systolic pressure is 29.9 mmHg. Left Atrium: Left atrial size was normal in size. Right Atrium: Right atrial size was normal in size. Pericardium: There is no evidence of pericardial effusion. Mitral Valve: The mitral valve is degenerative in appearance. Mild mitral annular calcification. Mild mitral valve regurgitation. Tricuspid Valve: The tricuspid valve is grossly normal. Tricuspid valve regurgitation is mild. Aortic Valve: The aortic valve is tricuspid. There is mild aortic valve annular calcification. Aortic valve regurgitation is not visualized. No aortic stenosis is present. Aortic valve mean gradient measures 1.5 mmHg. Aortic valve peak gradient measures 3.0 mmHg. Aortic valve area, by VTI measures 3.74 cm. Pulmonic Valve: The pulmonic valve was grossly normal. Pulmonic valve regurgitation is trivial.  Aorta: The aortic root and ascending aorta are structurally normal, with no evidence of dilitation. Venous: The inferior vena cava is normal in size with less than 50% respiratory variability, suggesting right atrial pressure of 8 mmHg. IAS/Shunts: No atrial level shunt detected by color flow Doppler. Additional Comments: 3D was performed not requiring image post processing on an independent workstation and was indeterminate.  LEFT VENTRICLE PLAX 2D LVIDd:         3.70 cm LVIDs:         2.90 cm LV PW:         0.80 cm LV IVS:        1.00 cm LVOT diam:     2.20 cm LV SV:         57 LV SV Index:   31 LVOT Area:     3.80 cm  LV Volumes (MOD) LV vol d, MOD A2C: 70.6  ml LV vol d, MOD A4C: 56.8 ml LV vol s, MOD A2C: 30.9 ml LV vol s, MOD A4C: 30.3 ml LV SV MOD A2C:     39.7 ml LV SV MOD A4C:     56.8 ml LV SV MOD BP:      32.1 ml RIGHT VENTRICLE RV Basal diam:  3.90 cm RV Mid diam:    3.20 cm RV S prime:     11.90 cm/s TAPSE (M-mode): 2.0 cm LEFT ATRIUM             Index        RIGHT ATRIUM           Index LA diam:        3.10 cm 1.70 cm/m   RA Area:     16.10 cm LA Vol (A2C):   53.8 ml 29.46 ml/m  RA Volume:   42.40 ml  23.22 ml/m LA Vol (A4C):   37.8 ml 20.70 ml/m LA Biplane Vol: 45.8 ml 25.08 ml/m  AORTIC VALVE AV Area (Vmax):    3.89 cm AV Area (Vmean):   3.70 cm AV Area (VTI):     3.74 cm AV Vmax:           86.60 cm/s AV Vmean:          60.150 cm/s AV VTI:            0.152 m AV Peak Grad:      3.0 mmHg AV Mean Grad:      1.5 mmHg LVOT Vmax:         88.70 cm/s LVOT Vmean:        58.500 cm/s LVOT VTI:          0.149 m LVOT/AV VTI ratio: 0.98  AORTA Ao Root diam: 3.50 cm Ao Asc diam:  3.70 cm MITRAL VALVE               TRICUSPID VALVE MV Area (PHT): 4.71 cm    TR Peak grad:   21.9 mmHg MV Decel Time: 161 msec    TR Vmax:        234.00 cm/s MR Peak grad: 75.7 mmHg MR Vmax:      435.00 cm/s  SHUNTS MV E velocity: 60.80 cm/s  Systemic VTI:  0.15 m MV A velocity: 82.80 cm/s  Systemic Diam: 2.20 cm MV E/A ratio:   0.73 Jayson Sierras MD Electronically signed by Jayson Sierras MD Signature Date/Time: 07/29/2024/2:28:35 PM    Final    US  Venous Img Lower Bilateral (DVT) Result Date: 07/28/2024 CLINICAL DATA:  Pain and swelling EXAM: BILATERAL LOWER EXTREMITY VENOUS DOPPLER ULTRASOUND TECHNIQUE: Gray-scale sonography with graded compression, as well as color Doppler and duplex ultrasound were performed to evaluate the lower extremity deep venous systems from the level of the common femoral vein and including the common femoral, femoral, profunda femoral, popliteal and calf veins including the posterior tibial, peroneal and gastrocnemius veins when visible. The superficial great saphenous vein was also interrogated. Spectral Doppler was utilized to evaluate flow at rest and with distal augmentation maneuvers in the common femoral, femoral and popliteal veins. COMPARISON:  None Available. FINDINGS: RIGHT LOWER EXTREMITY Common Femoral Vein: No evidence of thrombus. Normal compressibility, respiratory phasicity and response to augmentation. Saphenofemoral Junction: No evidence of thrombus. Normal compressibility and flow on color Doppler imaging. Profunda Femoral Vein: No evidence of thrombus. Normal compressibility and flow on color Doppler imaging. Femoral Vein: No evidence of thrombus. Normal compressibility, respiratory phasicity  and response to augmentation. Popliteal Vein: No evidence of thrombus. Normal compressibility, respiratory phasicity and response to augmentation. Calf Veins: No evidence of thrombus. Normal compressibility and flow on color Doppler imaging. Superficial Great Saphenous Vein: No evidence of thrombus. Normal compressibility. Venous Reflux:  None. Other Findings:  None. LEFT LOWER EXTREMITY Common Femoral Vein: No evidence of thrombus. Normal compressibility, respiratory phasicity and response to augmentation. Saphenofemoral Junction: No evidence of thrombus. Normal compressibility and flow on color  Doppler imaging. Profunda Femoral Vein: No evidence of thrombus. Normal compressibility and flow on color Doppler imaging. Femoral Vein: No evidence of thrombus. Normal compressibility, respiratory phasicity and response to augmentation. Popliteal Vein: No evidence of thrombus. Normal compressibility, respiratory phasicity and response to augmentation. Calf Veins: No evidence of thrombus. Normal compressibility and flow on color Doppler imaging. Superficial Great Saphenous Vein: No evidence of thrombus. Normal compressibility. Venous Reflux:  None. Other Findings:  None. IMPRESSION: No evidence of deep venous thrombosis in either lower extremity. Electronically Signed   By: Greig Pique M.D.   On: 07/28/2024 16:04   DG Chest 2 View Result Date: 07/28/2024 CLINICAL DATA:  Shortness of breath EXAM: CHEST - 2 VIEW COMPARISON:  December 19, 2023 chest CT FINDINGS: The heart size is borderline normal. Prominent bilateral hilar and vascular congestion. Mediastinal contours are within normal limits. Blunting of left costophrenic angle increased to prior. The visualized skeletal structures are unremarkable. Degenerative changes of the spine IMPRESSION: Mild cardiomegaly and bilateral vascular congestion. Blunting of the left costophrenic angle suggestive of small pleural effusion/atelectasis, new to prior. Electronically Signed   By: Megan  Zare M.D.   On: 07/28/2024 13:45    Microbiology: Results for orders placed or performed during the hospital encounter of 12/19/23  Culture, blood (Routine X 2) w Reflex to ID Panel     Status: None   Collection Time: 12/19/23  4:50 PM   Specimen: Left Antecubital; Blood  Result Value Ref Range Status   Specimen Description LEFT ANTECUBITAL  Final   Special Requests   Final    BOTTLES DRAWN AEROBIC AND ANAEROBIC Blood Culture results may not be optimal due to an inadequate volume of blood received in culture bottles   Culture   Final    NO GROWTH 5 DAYS Performed at  [facility], [address] [place], [place] [address]    Report Status 12/24/2023 FINAL  Final  Culture, blood (Routine X 2) w Reflex to ID Panel     Status: None   Collection Time: 12/19/23  4:56 PM   Specimen: Right Antecubital; Blood  Result Value Ref Range Status   Specimen Description RIGHT ANTECUBITAL  Final   Special Requests   Final    BOTTLES DRAWN AEROBIC AND ANAEROBIC Blood Culture adequate volume   Culture   Final    NO GROWTH 5 DAYS Performed at [facility], [address] [place], [place] [address]    Report Status 12/24/2023 FINAL  Final  Resp panel by RT-PCR (RSV, Flu A&B, Covid) Anterior Nasal Swab     Status: None   Collection Time: 12/19/23  5:12 PM   Specimen: Anterior Nasal Swab  Result Value Ref Range Status   SARS Coronavirus 2 by RT PCR NEGATIVE NEGATIVE Final    Comment: (NOTE) SARS-CoV-2 target nucleic acids are NOT DETECTED.  The SARS-CoV-2 RNA is generally detectable in upper respiratory specimens during the acute phase of infection. The lowest concentration of SARS-CoV-2 viral copies this assay can detect is 138 copies/mL.  A negative result does not preclude SARS-Cov-2 infection and should not be used as the sole basis for treatment or other patient management decisions. A negative result may occur with  improper specimen collection/handling, submission of specimen other than nasopharyngeal swab, presence of viral mutation(s) within the areas targeted by this assay, and inadequate number of viral copies(<138 copies/mL). A negative result must be combined with clinical observations, patient history, and epidemiological information. The expected result is Negative.  Fact Sheet for Patients:  BloggerCourse.com  Fact Sheet for Healthcare Providers:  SeriousBroker.it  This test is no t yet approved or cleared by the [place]  FDA and  has been authorized for detection and/or diagnosis of  SARS-CoV-2 by FDA under an Emergency Use Authorization (EUA). This EUA will remain  in effect (meaning this test can be used) for the duration of the COVID-19 declaration under Section 564(b)(1) of the Act, 21 U.S.C.section 360bbb-3(b)(1), unless the authorization is terminated  or revoked sooner.       Influenza A by PCR NEGATIVE NEGATIVE Final   Influenza B by PCR NEGATIVE NEGATIVE Final    Comment: (NOTE) The Xpert Xpress SARS-CoV-2/FLU/RSV plus assay is intended as an aid in the diagnosis of influenza from Nasopharyngeal swab specimens and should not be used as a sole basis for treatment. Nasal washings and aspirates are unacceptable for Xpert Xpress SARS-CoV-2/FLU/RSV testing.  Fact Sheet for Patients: BloggerCourse.com  Fact Sheet for Healthcare Providers: SeriousBroker.it  This test is not yet approved or cleared by the [place]  FDA and has been authorized for detection and/or diagnosis of SARS-CoV-2 by FDA under an Emergency Use Authorization (EUA). This EUA will remain in effect (meaning this test can be used) for the duration of the COVID-19 declaration under Section 564(b)(1) of the Act, 21 U.S.C. section 360bbb-3(b)(1), unless the authorization is terminated or revoked.     Resp Syncytial Virus by PCR NEGATIVE NEGATIVE Final    Comment: (NOTE) Fact Sheet for Patients: BloggerCourse.com  Fact Sheet for Healthcare Providers: SeriousBroker.it  This test is not yet approved or cleared by the [place]  FDA and has been authorized for detection and/or diagnosis of SARS-CoV-2 by FDA under an Emergency Use Authorization (EUA). This EUA will remain in effect (meaning this test can be used) for the duration of the COVID-19 declaration under Section 564(b)(1) of the Act, 21 U.S.C. section 360bbb-3(b)(1), unless the authorization is terminated  or revoked.  Performed at [facility], [address], [place], [place] [address]     Labs: CBC: Recent Labs  Lab 07/28/24 1328  WBC 6.0  NEUTROABS 2.6  HGB 13.6  HCT 42.0  MCV 89.0  PLT 254   Basic Metabolic Panel: Recent Labs  Lab 07/28/24 1328 07/29/24 0426 07/30/24 0433  NA 136 135 135  K 3.1* 4.0 3.6  CL 103 103 100  CO2 25 26 25   GLUCOSE 91 91 110*  BUN 20 17 21   CREATININE 0.66 0.68 0.78  CALCIUM  8.9 8.8* 9.0  MG 1.9  --   --    Liver Function Tests: Recent Labs  Lab 07/28/24 1328  AST 21  ALT 10  ALKPHOS 56  BILITOT 0.6  PROT 7.6  ALBUMIN 3.6   CBG: Recent Labs  Lab 07/29/24 1105 07/29/24 1619 07/29/24 2115 07/30/24 0754 07/30/24 1113  GLUCAP 91 83 116* 99 135*    Discharge time spent:  35 minutes.  Signed: Eric Nunnery, MD [facility] 07/30/2024

## 2024-07-31 ENCOUNTER — Telehealth: Payer: Self-pay

## 2024-07-31 NOTE — Transitions of Care (Post Inpatient/ED Visit) (Signed)
   07/31/2024  Name: Kim [name] MRN: [id number] DOB: 15-Jun-1941  Today's TOC FU Call Status: Today's TOC FU Call Status:: Successful TOC FU Call Completed TOC FU Call Complete Date: 07/31/24 Patient's Name and Date of Birth confirmed.  Transition Care Management Follow-up Telephone Call Date of Discharge: 07/30/24 [facility] Type of Discharge: Inpatient Admission Primary Inpatient Discharge Diagnosis:: Acute congestive heart failure How have you been since you were released from the hospital?: Better Any questions or concerns?: No  Items Reviewed: Did you receive and understand the discharge instructions provided?: Yes Medications obtained,verified, and reconciled?: No Medications Not Reviewed Reasons:: Other: (patient declined.)  Placed call to patient and explained reason for call.. Patient declined reviewing her discharge instructions, medications and follow up. Reports that she can read. States that she has her medications and understands what to do.    Medications and assessments no reviewed. Encouraged patient to call me back if she changes her mind.  Alan Ee, RN, BSN, CEN [employer]- Transition of Care Team.  [facility] [phone]

## 2024-08-07 ENCOUNTER — Ambulatory Visit (INDEPENDENT_AMBULATORY_CARE_PROVIDER_SITE_OTHER): Admitting: Family Medicine

## 2024-08-07 ENCOUNTER — Encounter: Payer: Self-pay | Admitting: Family Medicine

## 2024-08-07 VITALS — BP 105/68 | HR 89 | Ht 62.0 in | Wt 175.0 lb

## 2024-08-07 DIAGNOSIS — I509 Heart failure, unspecified: Secondary | ICD-10-CM | POA: Diagnosis not present

## 2024-08-07 DIAGNOSIS — I5033 Acute on chronic diastolic (congestive) heart failure: Secondary | ICD-10-CM

## 2024-08-07 DIAGNOSIS — R600 Localized edema: Secondary | ICD-10-CM | POA: Diagnosis not present

## 2024-08-07 NOTE — Assessment & Plan Note (Signed)
Referral placed to cardiology 

## 2024-08-07 NOTE — Progress Notes (Signed)
 Established Patient Office Visit  Subjective:  Patient ID: Kim [name], female    DOB: [birth year]  Age: 83 y.o. MRN: [id number]  CC:  Chief Complaint  Patient presents with   Medical Management of Chronic Issues    Hospital Follow up    HPI Kim [name] is a 83 y.o. female with past medical history of  Hypertension, Low back pain, type 2 diabetes presents for  Hospital f/u.      The patient presents for hospital follow-up after a recent ED visit for peripheral edema. She reported bilateral lower extremity swelling for approximately one week, accompanied by mild shortness of breath with exertion but no chest pain. She noted a history of prior diuretic use. While in the ED, her vitals were stable, with lab findings including BNP 107, troponin 7, and potassium 3.1. A chest X-ray revealed cardiomegaly, bilateral vascular congestion, and small pleural effusion/atelectasis. Bilateral lower extremity venous Dopplers were negative for DVT. She responded well to diuresis.  At discharge, she was not requiring oxygen supplementation, had no orthopnea, and was advised to follow a low-sodium diet and perform daily weight monitoring. She was discharged on Losartan , Lasix  daily, and Farxiga  as part of guideline-directed medical therapy (GDMT) for volume and blood pressure management.  At today's visit, the patient reports improvement in lower extremity swelling but continues to experience shortness of breath with exertion. She denies chest pain or chest tightness at this time.   Past Medical History:  Diagnosis Date   Acid reflux    Arthritis    Back spasm    Carpal tunnel syndrome    Hypertension    Rheumatoid arthritis [facility])     Past Surgical History:  Procedure Laterality Date   KNEE SURGERY     ORIF TOE FRACTURE Left 02/27/2021   Procedure: OPEN REDUCTION INTERNAL FIXATION (ORIF) METATARSAL (TOE) FRACTURE;  Surgeon: Onesimo Oneil LABOR, MD;  Location: [facility];  Service: Orthopedics;   Laterality: Left;  ORIF of 1st and 2nd metatarsal fractures   TUBAL LIGATION      Family History  Problem Relation Age of Onset   Heart failure Mother    Stroke Mother    Heart failure Father    Stroke Father    Hypertension Sister     Social History   Socioeconomic History   Marital status: Divorced    Spouse name: Not on file   Number of children: 8   Years of education: Not on file   Highest education level: Not on file  Occupational History   Not on file  Tobacco Use   Smoking status: Never    Passive exposure: Never   Smokeless tobacco: Never  Vaping Use   Vaping status: Never Used  Substance and Sexual Activity   Alcohol use: No   Drug use: No   Sexual activity: Not Currently    Birth control/protection: Surgical    Comment: tubal  Other Topics Concern   Not on file  Social History Narrative   Not on file   Social Drivers of Health   Financial Resource Strain: Not on file  Food Insecurity: No Food Insecurity (07/28/2024)   Hunger Vital Sign    Worried About Running Out of Food in the Last Year: Never true    Ran Out of Food in the Last Year: Never true  Transportation Needs: No Transportation Needs (07/28/2024)   PRAPARE - Administrator, Civil Service (Medical): No    Lack of Transportation (Non-Medical): No  Physical Activity: Not on file  Stress: Not on file  Social Connections: Moderately Integrated (07/28/2024)   Social Connection and Isolation Panel    Frequency of Communication with Friends and Family: More than three times a week    Frequency of Social Gatherings with Friends and Family: More than three times a week    Attends Religious Services: More than 4 times per year    Active Member of [employer] or Organizations: Yes    Attends Banker Meetings: More than 4 times per year    Marital Status: Widowed  Intimate Partner Violence: Not At Risk (07/28/2024)   Humiliation, Afraid, Rape, and Kick questionnaire    Fear of  Current or Ex-Partner: No    Emotionally Abused: No    Physically Abused: No    Sexually Abused: No    Outpatient Medications Prior to Visit  Medication Sig Dispense Refill   acetaminophen  (TYLENOL  8 HOUR ARTHRITIS PAIN) 650 MG CR tablet Take 1 tablet (650 mg total) by mouth every 8 (eight) hours as needed for pain. (Patient taking differently: Take 650 mg by mouth every morning.) 60 tablet 5   albuterol  (VENTOLIN  HFA) 108 (90 Base) MCG/ACT inhaler Inhale 2 puffs into the lungs every 6 (six) hours as needed. (Patient taking differently: Inhale 2 puffs into the lungs every 6 (six) hours as needed for wheezing or shortness of breath.) 8 g 0   azelaic acid  (AZELEX ) 20 % cream Apply topically 2 (two) times daily. After skin is thoroughly washed and patted dry, gently but thoroughly massage a thin film of azelaic acid  cream into the affected area twice daily, in the morning and evening. 30 g 1   Blood Pressure Monitoring (BLOOD PRESSURE KIT) DEVI Take blood pressure reading once daily. 1 each 0   Cholecalciferol (VITAMIN D3) 50 MCG (2000 UT) capsule Take 2,000 Units by mouth daily.     dapagliflozin  propanediol (FARXIGA ) 10 MG TABS tablet Take 1 tablet (10 mg total) by mouth daily before breakfast. 30 tablet 2   furosemide  (LASIX ) 40 MG tablet Take 1 tablet (40 mg total) by mouth daily. 30 tablet 3   losartan  (COZAAR ) 25 MG tablet Take 1 tablet (25 mg total) by mouth daily. 30 tablet 3   metFORMIN  (GLUCOPHAGE ) 500 MG tablet Take 0.5 tablets (250 mg total) by mouth daily with breakfast.     rosuvastatin  (CRESTOR ) 10 MG tablet Take 1 tablet (10 mg total) by mouth daily. (Patient taking differently: Take 10 mg by mouth at bedtime.) 90 tablet 3   Spacer/Aero-Holding Chambers (AEROCHAMBER PLUS) inhaler Use with inhaler 1 each 2   SYMBICORT  80-4.5 MCG/ACT inhaler Inhale 2 puffs into the lungs 2 (two) times daily. (Patient not taking: Reported on 08/07/2024) 1 each 3   No facility-administered medications  prior to visit.    Allergies  Allergen Reactions   Tramadol Hives and Nausea And Vomiting    ROS Review of Systems  Constitutional:  Positive for fatigue. Negative for fever.  Respiratory:  Positive for shortness of breath (with exertion). Negative for cough, choking, chest tightness and wheezing.   Cardiovascular:  Negative for chest pain, palpitations and leg swelling.  Neurological:  Negative for dizziness, tremors, syncope, light-headedness, numbness and headaches.  Psychiatric/Behavioral:  The patient is not hyperactive.       Objective:    Physical Exam HENT:     Head: Normocephalic.     Mouth/Throat:     Mouth: Mucous membranes are moist.  Cardiovascular:  Rate and Rhythm: Normal rate.     Heart sounds: Normal heart sounds.  Pulmonary:     Effort: Pulmonary effort is normal.     Breath sounds: Normal breath sounds.  Musculoskeletal:        General: No deformity or signs of injury.     Right lower leg: No edema.     Left lower leg: No edema.  Neurological:     Mental Status: She is alert.     BP 105/68   Pulse 89   Ht 5' 2 (1.575 m)   Wt 175 lb (79.4 kg)   SpO2 95%   BMI 32.01 kg/m  Wt Readings from Last 3 Encounters:  08/07/24 175 lb (79.4 kg)  07/30/24 169 lb 8.5 oz (76.9 kg)  07/28/24 182 lb (82.6 kg)    Lab Results  Component Value Date   TSH 1.230 09/06/2023   Lab Results  Component Value Date   WBC 6.0 07/28/2024   HGB 13.6 07/28/2024   HCT 42.0 07/28/2024   MCV 89.0 07/28/2024   PLT 254 07/28/2024   Lab Results  Component Value Date   NA 135 07/30/2024   K 3.6 07/30/2024   CO2 25 07/30/2024   GLUCOSE 110 (H) 07/30/2024   BUN 21 07/30/2024   CREATININE 0.78 07/30/2024   BILITOT 0.6 07/28/2024   ALKPHOS 56 07/28/2024   AST 21 07/28/2024   ALT 10 07/28/2024   PROT 7.6 07/28/2024   ALBUMIN 3.6 07/28/2024   CALCIUM  9.0 07/30/2024   ANIONGAP 10 07/30/2024   EGFR 79 06/23/2024   Lab Results  Component Value Date   CHOL 194  01/29/2024   Lab Results  Component Value Date   HDL 59 01/29/2024   Lab Results  Component Value Date   LDLCALC 119 (H) 01/29/2024   Lab Results  Component Value Date   TRIG 87 01/29/2024   Lab Results  Component Value Date   CHOLHDL 3.3 01/29/2024   Lab Results  Component Value Date   HGBA1C 6.3 (H) 01/29/2024      Assessment & Plan:  Peripheral edema Assessment & Plan: Labs and imaging study reviewed Encouraged to continue current treatment regimen as is Will assess BMP and CBC I recommend daily elevation of the legs for 20-30 minutes above the level of the heart. Additionally, daily use of compression stockings is advised, along with regular exercise and weight reduction. It's also important to refrain from prolonged sitting or standing to alleviate symptoms.     Acute on chronic diastolic CHF (congestive heart failure) (HCC) Assessment & Plan: Referral placed to cardiology  Orders: -     Ambulatory referral to Cardiology -     BMP8+EGFR -     CBC with Differential/Platelet  Note: This chart has been completed using Dragon Medical Dictation software, and while attempts have been made to ensure accuracy, certain words and phrases may not be transcribed as intended.    Follow-up: Return in about 2 weeks (around 08/21/2024).   Clarivel Callaway, FNP

## 2024-08-07 NOTE — Assessment & Plan Note (Signed)
 Labs and imaging study reviewed Encouraged to continue current treatment regimen as is Will assess BMP and CBC I recommend daily elevation of the legs for 20-30 minutes above the level of the heart. Additionally, daily use of compression stockings is advised, along with regular exercise and weight reduction. It's also important to refrain from prolonged sitting or standing to alleviate symptoms.

## 2024-08-07 NOTE — Patient Instructions (Addendum)
 I appreciate the opportunity to provide care to you today!    Follow up:  2 weeks  Labs: please stop by the lab today to get your blood drawn (CBC, BMP)  Continue your current treatment regimen as is I recommend daily elevation of the legs for 20-30 minutes above the level of the heart. Additionally, daily use of compression stockings is advised, along with regular exercise and weight reduction. It's also important to refrain from prolonged sitting or standing to alleviate symptoms.    Please follow up if your symptoms worsen or fail to improve.   Attached with your AVS, you will find valuable resources for self-education. I highly recommend dedicating some time to thoroughly examine them.   Please continue to a heart-healthy diet and increase your physical activities. Try to exercise for at least five days a week.    It was a pleasure to see you and I look forward to continuing to work together on your health and well-being. Please do not hesitate to call the office if you need care or have questions about your care.  In case of emergency, please visit the Emergency Department for urgent care, or contact our clinic at [phone] to schedule an appointment. We're here to help you!   Have a wonderful day and week. With Gratitude, Terrell Ostrand MSN, FNP-BC

## 2024-08-08 LAB — CBC WITH DIFFERENTIAL/PLATELET
Basophils Absolute: 0.1 x10E3/uL (ref 0.0–0.2)
Basos: 2 %
EOS (ABSOLUTE): 0.1 x10E3/uL (ref 0.0–0.4)
Eos: 2 %
Hematocrit: 45.8 % (ref 34.0–46.6)
Hemoglobin: 14.5 g/dL (ref 11.1–15.9)
Immature Grans (Abs): 0 x10E3/uL (ref 0.0–0.1)
Immature Granulocytes: 0 %
Lymphocytes Absolute: 2.7 x10E3/uL (ref 0.7–3.1)
Lymphs: 49 %
MCH: 29 pg (ref 26.6–33.0)
MCHC: 31.7 g/dL (ref 31.5–35.7)
MCV: 92 fL (ref 79–97)
Monocytes Absolute: 0.5 x10E3/uL (ref 0.1–0.9)
Monocytes: 9 %
Neutrophils Absolute: 2.1 x10E3/uL (ref 1.4–7.0)
Neutrophils: 38 %
Platelets: 273 x10E3/uL (ref 150–450)
RBC: 5 x10E6/uL (ref 3.77–5.28)
RDW: 13.3 % (ref 11.7–15.4)
WBC: 5.5 x10E3/uL (ref 3.4–10.8)

## 2024-08-08 LAB — BMP8+EGFR
BUN/Creatinine Ratio: 26 (ref 12–28)
BUN: 24 mg/dL (ref 8–27)
CO2: 27 mmol/L (ref 20–29)
Calcium: 9.4 mg/dL (ref 8.7–10.3)
Chloride: 97 mmol/L (ref 96–106)
Creatinine, Ser: 0.92 mg/dL (ref 0.57–1.00)
Glucose: 90 mg/dL (ref 70–99)
Potassium: 4.1 mmol/L (ref 3.5–5.2)
Sodium: 139 mmol/L (ref 134–144)
eGFR: 62 mL/min/1.73 (ref >59)

## 2024-08-17 ENCOUNTER — Ambulatory Visit: Payer: Self-pay | Admitting: Family Medicine

## 2024-08-27 ENCOUNTER — Other Ambulatory Visit: Payer: Self-pay | Admitting: Family Medicine

## 2024-08-27 ENCOUNTER — Ambulatory Visit: Admitting: Family Medicine

## 2024-09-09 DIAGNOSIS — M1990 Unspecified osteoarthritis, unspecified site: Secondary | ICD-10-CM | POA: Diagnosis not present

## 2024-09-09 DIAGNOSIS — M25561 Pain in right knee: Secondary | ICD-10-CM | POA: Diagnosis not present

## 2024-09-09 DIAGNOSIS — M25562 Pain in left knee: Secondary | ICD-10-CM | POA: Diagnosis not present

## 2024-09-09 DIAGNOSIS — M79642 Pain in left hand: Secondary | ICD-10-CM | POA: Diagnosis not present

## 2024-09-09 DIAGNOSIS — M79641 Pain in right hand: Secondary | ICD-10-CM | POA: Diagnosis not present

## 2024-09-09 DIAGNOSIS — M25569 Pain in unspecified knee: Secondary | ICD-10-CM | POA: Diagnosis not present

## 2024-09-09 DIAGNOSIS — Z79899 Other long term (current) drug therapy: Secondary | ICD-10-CM | POA: Diagnosis not present

## 2024-09-09 DIAGNOSIS — M0579 Rheumatoid arthritis with rheumatoid factor of multiple sites without organ or systems involvement: Secondary | ICD-10-CM | POA: Diagnosis not present

## 2024-09-09 DIAGNOSIS — M79672 Pain in left foot: Secondary | ICD-10-CM | POA: Diagnosis not present

## 2024-09-09 DIAGNOSIS — M79673 Pain in unspecified foot: Secondary | ICD-10-CM | POA: Diagnosis not present

## 2024-09-09 DIAGNOSIS — M79643 Pain in unspecified hand: Secondary | ICD-10-CM | POA: Diagnosis not present

## 2024-09-09 DIAGNOSIS — M79671 Pain in right foot: Secondary | ICD-10-CM | POA: Diagnosis not present

## 2024-09-09 DIAGNOSIS — M7989 Other specified soft tissue disorders: Secondary | ICD-10-CM | POA: Diagnosis not present

## 2024-09-10 ENCOUNTER — Other Ambulatory Visit: Payer: Self-pay | Admitting: Family Medicine

## 2024-09-10 DIAGNOSIS — I739 Peripheral vascular disease, unspecified: Secondary | ICD-10-CM | POA: Diagnosis not present

## 2024-09-10 DIAGNOSIS — M79675 Pain in left toe(s): Secondary | ICD-10-CM | POA: Diagnosis not present

## 2024-09-10 DIAGNOSIS — M79672 Pain in left foot: Secondary | ICD-10-CM | POA: Diagnosis not present

## 2024-09-10 DIAGNOSIS — M2042 Other hammer toe(s) (acquired), left foot: Secondary | ICD-10-CM | POA: Diagnosis not present

## 2024-09-10 DIAGNOSIS — M79674 Pain in right toe(s): Secondary | ICD-10-CM | POA: Diagnosis not present

## 2024-09-10 DIAGNOSIS — L110 Acquired keratosis follicularis: Secondary | ICD-10-CM | POA: Diagnosis not present

## 2024-09-10 DIAGNOSIS — M79671 Pain in right foot: Secondary | ICD-10-CM | POA: Diagnosis not present

## 2024-09-11 ENCOUNTER — Ambulatory Visit (HOSPITAL_COMMUNITY)

## 2024-09-18 NOTE — Progress Notes (Deleted)
 CARDIOLOGY CONSULT NOTE       Patient ID: Kim [name] MRN: [id number] DOB/AGE: 82-Dec-1942 82 y.o.  Admit date: (Not on file) Referring Physician: Ricky Primary Physician: Terry Wilhelmena Lloyd Hilario, FNP Primary Cardiologist: New Reason for Consultation: CHF  Active Problems:   * No active hospital problems. *   HPI:  83 y.o. referred post hospital by Dr Ricky for CHF. History of HTN, DM, RA admitted to [facility] for dyspnea and LE edema 7/29-31 2025. Diagnosis not clear. BNP only 107 sats normal CXR with CE bilateral vascular congestion. R/O no chest pain. LE duplex with no DVT. TTE done 07/29/24 with EF 55-60% mild LVH indeterminate diastolic parameters. Normal RV mild MR She was d/c on Farxiga , lasix  40 mg daily and losartan  25 mg. Amlodipine  and chlorthalidone  d/c. D/C weight was 76.9 kg.   ROS All other systems reviewed and negative except as noted above  Past Medical History:  Diagnosis Date   Acid reflux    Arthritis    Back spasm    Carpal tunnel syndrome    Hypertension    Rheumatoid arthritis (HCC)     Family History  Problem Relation Age of Onset   Heart failure Mother    Stroke Mother    Heart failure Father    Stroke Father    Hypertension Sister     Social History   Socioeconomic History   Marital status: Divorced    Spouse name: Not on file   Number of children: 8   Years of education: Not on file   Highest education level: Not on file  Occupational History   Not on file  Tobacco Use   Smoking status: Never    Passive exposure: Never   Smokeless tobacco: Never  Vaping Use   Vaping status: Never Used  Substance and Sexual Activity   Alcohol use: No   Drug use: No   Sexual activity: Not Currently    Birth control/protection: Surgical    Comment: tubal  Other Topics Concern   Not on file  Social History Narrative   Not on file   Social Drivers of Health   Financial Resource Strain: Not on file  Food Insecurity: No Food Insecurity  (07/28/2024)   Hunger Vital Sign    Worried About Running Out of Food in the Last Year: Never true    Ran Out of Food in the Last Year: Never true  Transportation Needs: No Transportation Needs (07/28/2024)   PRAPARE - Administrator, Civil Service (Medical): No    Lack of Transportation (Non-Medical): No  Physical Activity: Not on file  Stress: Not on file  Social Connections: Moderately Integrated (07/28/2024)   Social Connection and Isolation Panel    Frequency of Communication with Friends and Family: More than three times a week    Frequency of Social Gatherings with Friends and Family: More than three times a week    Attends Religious Services: More than 4 times per year    Active Member of [employer] or Organizations: Yes    Attends Banker Meetings: More than 4 times per year    Marital Status: Widowed  Intimate Partner Violence: Not At Risk (07/28/2024)   Humiliation, Afraid, Rape, and Kick questionnaire    Fear of Current or Ex-Partner: No    Emotionally Abused: No    Physically Abused: No    Sexually Abused: No    Past Surgical History:  Procedure Laterality Date   KNEE SURGERY  ORIF TOE FRACTURE Left 02/27/2021   Procedure: OPEN REDUCTION INTERNAL FIXATION (ORIF) METATARSAL (TOE) FRACTURE;  Surgeon: Onesimo Oneil LABOR, MD;  Location: [facility];  Service: Orthopedics;  Laterality: Left;  ORIF of 1st and 2nd metatarsal fractures   TUBAL LIGATION        Current Outpatient Medications:    acetaminophen  (TYLENOL  8 HOUR ARTHRITIS PAIN) 650 MG CR tablet, Take 1 tablet (650 mg total) by mouth every 8 (eight) hours as needed for pain. (Patient taking differently: Take 650 mg by mouth every morning.), Disp: 60 tablet, Rfl: 5   albuterol  (VENTOLIN  HFA) 108 (90 Base) MCG/ACT inhaler, Inhale 2 puffs into the lungs every 6 (six) hours as needed. (Patient taking differently: Inhale 2 puffs into the lungs every 6 (six) hours as needed for wheezing or shortness of breath.),  Disp: 8 g, Rfl: 0   azelaic acid  (AZELEX ) 20 % cream, Apply topically 2 (two) times daily. After skin is thoroughly washed and patted dry, gently but thoroughly massage a thin film of azelaic acid  cream into the affected area twice daily, in the morning and evening., Disp: 30 g, Rfl: 1   Blood Pressure Monitoring (BLOOD PRESSURE KIT) DEVI, Take blood pressure reading once daily., Disp: 1 each, Rfl: 0   Cholecalciferol (VITAMIN D3) 50 MCG (2000 UT) capsule, Take 2,000 Units by mouth daily., Disp: , Rfl:    dapagliflozin  propanediol (FARXIGA ) 10 MG TABS tablet, Take 1 tablet (10 mg total) by mouth daily before breakfast., Disp: 30 tablet, Rfl: 2   furosemide  (LASIX ) 40 MG tablet, Take 1 tablet (40 mg total) by mouth daily., Disp: 30 tablet, Rfl: 3   losartan  (COZAAR ) 25 MG tablet, Take 1 tablet (25 mg total) by mouth daily., Disp: 30 tablet, Rfl: 3   metFORMIN  (GLUCOPHAGE ) 500 MG tablet, Take 0.5 tablets (250 mg total) by mouth daily with breakfast., Disp: , Rfl:    rosuvastatin  (CRESTOR ) 10 MG tablet, Take 1 tablet (10 mg total) by mouth at bedtime., Disp: 100 tablet, Rfl: 3   Spacer/Aero-Holding Chambers (AEROCHAMBER PLUS) inhaler, Use with inhaler, Disp: 1 each, Rfl: 2   SYMBICORT  80-4.5 MCG/ACT inhaler, Inhale 2 puffs into the lungs 2 (two) times daily. (Patient not taking: Reported on 08/07/2024), Disp: 1 each, Rfl: 3    Physical Exam: There were no vitals taken for this visit.    Affect appropriate Healthy:  appears stated age HEENT: normal Neck supple with no adenopathy JVP normal no bruits no thyromegaly Lungs clear with no wheezing and good diaphragmatic motion Heart:  S1/S2 no murmur, no rub, gallop or click PMI normal Abdomen: benighn, BS positve, no tenderness, no AAA no bruit.  No HSM or HJR Distal pulses intact with no bruits No edema Neuro non-focal Skin warm and dry No muscular weakness   Labs:   Lab Results  Component Value Date   WBC 5.5 08/07/2024   HGB 14.5  08/07/2024   HCT 45.8 08/07/2024   MCV 92 08/07/2024   PLT 273 08/07/2024   No results for input(s): NA, K, CL, CO2, BUN, CREATININE, CALCIUM , PROT, BILITOT, ALKPHOS, ALT, AST, GLUCOSE in the last 168 hours.  Invalid input(s): LABALBU Lab Results  Component Value Date   TROPONINI <0.03 03/05/2018    Lab Results  Component Value Date   CHOL 194 01/29/2024   CHOL 217 (H) 09/06/2023   Lab Results  Component Value Date   HDL 59 01/29/2024   HDL 53 09/06/2023   Lab Results  Component Value Date  LDLCALC 119 (H) 01/29/2024   LDLCALC 145 (H) 09/06/2023   Lab Results  Component Value Date   TRIG 87 01/29/2024   TRIG 105 09/06/2023   Lab Results  Component Value Date   CHOLHDL 3.3 01/29/2024   CHOLHDL 4.1 09/06/2023   No results found for: LDLDIRECT    Radiology: No results found.  EKG: SR LAFB PAC no acute ST changes    ASSESSMENT AND PLAN:   HFpEF:  continue diuretic no angina negative troponin no need for ischemic w/u at her age. TTE with normal EF mild LVH no valvular dx to speak of.  On d/c K 4.0 and Cr 0.68 HTN:  continue lasix  and ARB *** HLD:  continue statin DM:  on glucophage  and farxiga   A1c 6.3   Update BMET on lasix   CXR ? Resolution of congestion   ***  F/U in a year   Signed: Maude Emmer 09/18/2024, 3:31 PM

## 2024-09-29 ENCOUNTER — Other Ambulatory Visit: Payer: Self-pay | Admitting: Family Medicine

## 2024-09-29 MED ORDER — ALBUTEROL SULFATE HFA 108 (90 BASE) MCG/ACT IN AERS: 2.0000 | INHALATION_SPRAY | Freq: Four times a day (QID) | RESPIRATORY_TRACT | 0 refills | Status: DC | PRN

## 2024-09-29 NOTE — Telephone Encounter (Signed)
 Copied from CRM #[id number]. Topic: Clinical - Medication Refill >> Sep 29, 2024  9:25 AM Rosaria E wrote: Medication:  albuterol  (VENTOLIN  HFA) 108 (90 Base) MCG/ACT inhaler  Has the patient contacted their pharmacy? Yes (Agent: If no, request that the patient contact the pharmacy for the refill. If patient does not wish to contact the pharmacy document the reason why and proceed with request.) (Agent: If yes, when and what did the pharmacy advise?)  This is the patient's preferred pharmacy:  [facility] - [place], [place] - [address] [place] [address] Phone: [phone] Fax: [phone]  Is this the correct pharmacy for this prescription? Yes If no, delete pharmacy and type the correct one.   Has the prescription been filled recently? Yes  Is the patient out of the medication? Yes  Has the patient been seen for an appointment in the last year OR does the patient have an upcoming appointment? Yes  Can we respond through MyChart? Yes  Agent: Please be advised that Rx refills may take up to 3 business days. We ask that you follow-up with your pharmacy.

## 2024-09-30 ENCOUNTER — Ambulatory Visit (HOSPITAL_COMMUNITY)
Admission: RE | Admit: 2024-09-30 | Discharge: 2024-09-30 | Disposition: A | Discharge status: Home / Self Care | Source: Ambulatory Visit | Attending: Family Medicine | Admitting: Family Medicine

## 2024-09-30 ENCOUNTER — Encounter (HOSPITAL_COMMUNITY): Payer: Self-pay

## 2024-09-30 DIAGNOSIS — Z1231 Encounter for screening mammogram for malignant neoplasm of breast: Secondary | ICD-10-CM | POA: Insufficient documentation

## 2024-09-30 DIAGNOSIS — M0579 Rheumatoid arthritis with rheumatoid factor of multiple sites without organ or systems involvement: Secondary | ICD-10-CM | POA: Diagnosis not present

## 2024-09-30 DIAGNOSIS — Z79899 Other long term (current) drug therapy: Secondary | ICD-10-CM | POA: Diagnosis not present

## 2024-10-02 ENCOUNTER — Ambulatory Visit: Admitting: Cardiovascular Disease

## 2024-10-15 ENCOUNTER — Ambulatory Visit

## 2024-10-15 VITALS — Ht 62.0 in | Wt 168.0 lb

## 2024-10-15 DIAGNOSIS — Z780 Asymptomatic menopausal state: Secondary | ICD-10-CM | POA: Diagnosis not present

## 2024-10-15 DIAGNOSIS — Z0000 Encounter for general adult medical examination without abnormal findings: Secondary | ICD-10-CM

## 2024-10-15 NOTE — Patient Instructions (Signed)
 [name],  Thank you for taking the time for your Medicare Wellness Visit. I appreciate your continued commitment to your health goals. Please review the care plan we discussed, and feel free to reach out if I can assist you further.  Medicare recommends these wellness visits once per year to help you and your care team stay ahead of potential health issues. These visits are designed to focus on prevention, allowing your provider to concentrate on managing your acute and chronic conditions during your regular appointments.  Please note that Annual Wellness Visits do not include a physical exam. Some assessments may be limited, especially if the visit was conducted virtually. If needed, we may recommend a separate in-person follow-up with your provider.  Ongoing Care  Seeing your primary care provider every 3 to 6 months helps us  monitor your health and provide consistent, personalized care.   Referrals   Osteoporosis Screening An order was placed for you to have your Osteoporosis Screening. Call the number below to schedule that [facility]  [phone]   Recommended Screenings:  Health Maintenance  Topic Date Due   COVID-19 Vaccine (1) Never done   Eye exam for diabetics  Never done   Zoster (Shingles) Vaccine (1 of 2) Never done   DEXA scan (bone density measurement)  02/11/2019   Hemoglobin A1C  07/28/2024   Flu Shot  07/31/2024   Yearly kidney health urinalysis for diabetes  09/05/2024   Pneumococcal Vaccine for age over 52 (2 of 2 - PCV) 01/15/2025*   Complete foot exam   01/15/2025   Yearly kidney function blood test for diabetes  08/07/2025   DTaP/Tdap/Td vaccine (2 - Td or Tdap) 09/15/2025   Breast Cancer Screening  09/30/2025   Medicare Annual Wellness Visit  10/15/2025   Meningitis B Vaccine  Aged Out  *Topic was postponed. The date shown is not the original due date.       10/15/2024   10:53 AM  Advanced Directives  Does Patient Have a Medical Advance  Directive? No  Would patient like information on creating a medical advance directive? No - Patient declined    Advance Care Planning is important because it: Ensures you receive medical care that aligns with your values, goals, and preferences. Provides guidance to your family and loved ones, reducing the emotional burden of decision-making during critical moments.  Vision: Annual vision screenings are recommended for early detection of glaucoma, cataracts, and diabetic retinopathy. These exams can also reveal signs of chronic conditions such as diabetes and high blood pressure.  Dental: Annual dental screenings help detect early signs of oral cancer, gum disease, and other conditions linked to overall health, including heart disease and diabetes.  Please see the attached documents for additional preventive care recommendations.

## 2024-10-15 NOTE — Progress Notes (Signed)
 Please attest and cosign this visit due to patients primary care provider not being immediately available at the time the visit was completed.   Subjective:   Margarita Croke is a 83 y.o. who presents for a Medicare Wellness preventive visit. As a reminder, Annual Wellness Visits don't include a physical exam, and some assessments may be limited, especially if this visit is performed virtually. We may recommend an in-person follow-up visit with your provider if needed.  Visit Complete: Virtual I connected with  Wanda Mulch on 10/15/24 by a audio enabled telemedicine application and verified that I am speaking with the correct person using two identifiers.  Patient Location: Home  Provider Location: Home Office  I discussed the limitations of evaluation and management by telemedicine. The patient expressed understanding and agreed to proceed.  Vital Signs: Because this visit was a virtual/telehealth visit, some criteria may be missing or patient reported. Any vitals not documented were not able to be obtained and vitals that have been documented are patient reported. VideoDeclined- This patient declined Librarian, academic. Therefore the visit was completed with audio only.  Persons Participating in Visit: Patient.  AWV Questionnaire: No: Patient Medicare AWV questionnaire was not completed prior to this visit. Cardiac Risk Factors include: advanced age (>62men, >48 women);diabetes mellitus;hypertension;obesity (BMI >30kg/m2);sedentary lifestyle;Other (see comment), Risk factor comments: CHF     Objective:    Today's Vitals   10/15/24 1053  Weight: 168 lb (76.2 kg)  Height: 5' 2 (1.575 m)  PainSc: 5    Body mass index is 30.73 kg/m.    10/15/2024   10:53 AM 07/28/2024    5:58 PM 07/28/2024    1:07 PM 12/19/2023    5:28 PM 08/29/2021   11:54 AM 05/04/2021   11:27 AM 02/27/2021    6:57 AM  Advanced Directives  Does Patient Have a Medical Advance  Directive? No No No No No No No  Would patient like information on creating a medical advance directive? No - Patient declined No - Patient declined No - Patient declined No - Patient declined No - Patient declined Yes (MAU/Ambulatory/Procedural Areas - Information given) No - Patient declined    Current Medications (verified) Outpatient Encounter Medications as of 10/15/2024  Medication Sig   acetaminophen  (TYLENOL  8 HOUR ARTHRITIS PAIN) 650 MG CR tablet Take 1 tablet (650 mg total) by mouth every 8 (eight) hours as needed for pain. (Patient taking differently: Take 650 mg by mouth every morning.)   albuterol  (VENTOLIN  HFA) 108 (90 Base) MCG/ACT inhaler Inhale 2 puffs into the lungs every 6 (six) hours as needed.   azelaic acid  (AZELEX ) 20 % cream Apply topically 2 (two) times daily. After skin is thoroughly washed and patted dry, gently but thoroughly massage a thin film of azelaic acid  cream into the affected area twice daily, in the morning and evening.   Blood Pressure Monitoring (BLOOD PRESSURE KIT) DEVI Take blood pressure reading once daily.   Cholecalciferol (VITAMIN D3) 50 MCG (2000 UT) capsule Take 2,000 Units by mouth daily.   dapagliflozin  propanediol (FARXIGA ) 10 MG TABS tablet Take 1 tablet (10 mg total) by mouth daily before breakfast.   furosemide  (LASIX ) 40 MG tablet Take 1 tablet (40 mg total) by mouth daily.   losartan  (COZAAR ) 25 MG tablet Take 1 tablet (25 mg total) by mouth daily.   metFORMIN  (GLUCOPHAGE ) 500 MG tablet Take 0.5 tablets (250 mg total) by mouth daily with breakfast.   rosuvastatin  (CRESTOR ) 10 MG tablet Take  1 tablet (10 mg total) by mouth at bedtime.   Spacer/Aero-Holding Chambers (AEROCHAMBER PLUS) inhaler Use with inhaler   SYMBICORT  80-4.5 MCG/ACT inhaler Inhale 2 puffs into the lungs 2 (two) times daily. (Patient not taking: Reported on 10/15/2024)   No facility-administered encounter medications on file as of 10/15/2024.    Allergies  (verified) Tramadol   History: Past Medical History:  Diagnosis Date   Acid reflux    Arthritis    Back spasm    Carpal tunnel syndrome    Hypertension    Rheumatoid arthritis (HCC)    Past Surgical History:  Procedure Laterality Date   KNEE SURGERY     ORIF TOE FRACTURE Left 02/27/2021   Procedure: OPEN REDUCTION INTERNAL FIXATION (ORIF) METATARSAL (TOE) FRACTURE;  Surgeon: Onesimo Oneil LABOR, MD;  Location: [facility];  Service: Orthopedics;  Laterality: Left;  ORIF of 1st and 2nd metatarsal fractures   TUBAL LIGATION     Family History  Problem Relation Age of Onset   Heart failure Mother    Stroke Mother    Heart failure Father    Stroke Father    Hypertension Sister    Social History   Socioeconomic History   Marital status: Divorced    Spouse name: Not on file   Number of children: 8   Years of education: Not on file   Highest education level: Not on file  Occupational History   Not on file  Tobacco Use   Smoking status: Never    Passive exposure: Never   Smokeless tobacco: Never  Vaping Use   Vaping status: Never Used  Substance and Sexual Activity   Alcohol use: No   Drug use: No   Sexual activity: Not Currently    Birth control/protection: Surgical    Comment: tubal  Other Topics Concern   Not on file  Social History Narrative   Not on file   Social Drivers of Health   Financial Resource Strain: Low Risk  (10/15/2024)   Overall Financial Resource Strain (CARDIA)    Difficulty of Paying Living Expenses: Not hard at all  Food Insecurity: No Food Insecurity (10/15/2024)   Hunger Vital Sign    Worried About Running Out of Food in the Last Year: Never true    Ran Out of Food in the Last Year: Never true  Transportation Needs: No Transportation Needs (10/15/2024)   PRAPARE - Administrator, Civil Service (Medical): No    Lack of Transportation (Non-Medical): No  Physical Activity: Inactive (10/15/2024)   Exercise Vital Sign    Days of  Exercise per Week: 0 days    Minutes of Exercise per Session: 0 min  Stress: No Stress Concern Present (10/15/2024)   [employer] of Occupational Health - Occupational Stress Questionnaire    Feeling of Stress: Not at all  Social Connections: Moderately Integrated (10/15/2024)   Social Connection and Isolation Panel    Frequency of Communication with Friends and Family: More than three times a week    Frequency of Social Gatherings with Friends and Family: More than three times a week    Attends Religious Services: More than 4 times per year    Active Member of [employer] or Organizations: Yes    Attends Banker Meetings: More than 4 times per year    Marital Status: Widowed    Tobacco Counseling Counseling given: Yes   Clinical Intake: Pre-visit preparation completed: Yes Pain : No/denies pain Pain Score: 5  BMI - recorded: 30.73 Nutritional Status: BMI > 30  Obese Nutritional Risks: None Diabetes: Yes CBG done?: No Did pt. bring in CBG monitor from home?: No Lab Results  Component Value Date   HGBA1C 6.3 (H) 01/29/2024   HGBA1C 6.5 (H) 09/06/2023    How often do you need to have someone help you when you read instructions, pamphlets, or other written materials from your doctor or pharmacy?: 1 - Never Interpreter Needed?: No Information entered by :: Brecken Walth W CMA (AAMA)  Activities of Daily Living     10/15/2024   11:01 AM 07/28/2024    5:58 PM  In your present state of health, do you have any difficulty performing the following activities:  Hearing? 0 0  Vision? 0 0  Difficulty concentrating or making decisions? 0 0  Walking or climbing stairs? 1   Dressing or bathing? 1   Doing errands, shopping? 0 1  Preparing Food and eating ? N   Using the Toilet? N   In the past six months, have you accidently leaked urine? N   Do you have problems with loss of bowel control? N   Managing your Medications? Y   Managing your Finances? N   Housekeeping or  managing your Housekeeping? Y    Patient Care Team: Del Wilhelmena Falter, Hilario, FNP as PCP - General (Family Medicine) Darroll Anes, DO (Optometry)  I have updated your Care Teams any recent Medical Services you may have received from other providers in the past year.     Assessment:   This is a routine wellness examination for [place].  Hearing/Vision screen Hearing Screening - Comments:: Patient wears hearing aids.  Vision Screening - Comments:: Wears rx glasses - up to date with routine eye exams with  [facility]   Goals Addressed               This Visit's Progress     stay active (pt-stated)          Depression Screen     10/15/2024   11:06 AM 07/09/2024    2:15 PM 06/22/2024   11:51 AM 01/16/2024   10:47 AM 09/23/2023   10:50 AM 08/30/2023   10:35 AM 12/28/2019   11:12 AM  PHQ 2/9 Scores  PHQ - 2 Score 0 6 6 6 3 1  0  PHQ- 9 Score 1 10 10  5 5       Fall Risk     10/15/2024   10:59 AM 06/22/2024   11:51 AM 02/19/2024   10:18 AM 01/16/2024   10:46 AM 09/23/2023   10:50 AM  Fall Risk   Falls in the past year? 0 0 0 0 0  Number falls in past yr: 0 0 0 0   Injury with Fall? 0  0 0 0  Risk for fall due to : Impaired balance/gait;Impaired mobility No Fall Risks Impaired balance/gait;Impaired mobility;Other (Comment) Impaired mobility;Other (Comment)   Risk for fall due to: Comment   age age   Follow up Falls prevention discussed;Education provided;Falls evaluation completed Falls evaluation completed Follow up appointment Follow up appointment     MEDICARE RISK AT HOME:  Medicare Risk at Home Any stairs in or around the home?: No If so, are there any without handrails?: No Home free of loose throw rugs in walkways, pet beds, electrical cords, etc?: Yes Adequate lighting in your home to reduce risk of falls?: Yes Life alert?: Yes Use of a cane, walker or w/c?: Yes  Grab bars in the bathroom?: Yes Shower chair or bench in shower?: No Elevated  toilet seat or a handicapped toilet?: No  TIMED UP AND GO: Was the test performed?  No  Cognitive Function: 6CIT completed        10/15/2024   11:04 AM  6CIT Screen  What Year? 0 points  What month? 0 points  What time? 0 points  Count back from 20 0 points  Months in reverse 0 points  Repeat phrase 0 points  Total Score 0 points    Immunizations Immunization History  Administered Date(s) Administered   INFLUENZA, HIGH DOSE SEASONAL PF 11/13/2019   Influenza,inj,Quad PF,6+ Mos 01/30/2019   Tdap 09/16/2015    Screening Tests Health Maintenance  Topic Date Due   COVID-19 Vaccine (1) Never done   OPHTHALMOLOGY EXAM  Never done   Zoster Vaccines- Shingrix (1 of 2) Never done   DEXA SCAN  02/11/2019   HEMOGLOBIN A1C  07/28/2024   Influenza Vaccine  07/31/2024   Diabetic kidney evaluation - Urine ACR  09/05/2024   Pneumococcal Vaccine: 50+ Years (2 of 2 - PCV) 01/15/2025 (Originally 07/31/2018)   FOOT EXAM  01/15/2025   Diabetic kidney evaluation - eGFR measurement  08/07/2025   DTaP/Tdap/Td (2 - Td or Tdap) 09/15/2025   Mammogram  09/30/2025   Medicare Annual Wellness (AWV)  10/15/2025   Meningococcal B Vaccine  Aged Out    Health Maintenance Health Maintenance Due  Topic Date Due   COVID-19 Vaccine (1) Never done   OPHTHALMOLOGY EXAM  Never done   Zoster Vaccines- Shingrix (1 of 2) Never done   DEXA SCAN  02/11/2019   HEMOGLOBIN A1C  07/28/2024   Influenza Vaccine  07/31/2024   Diabetic kidney evaluation - Urine ACR  09/05/2024   Health Maintenance Items Addressed: DEXA ordered  Additional Screening: Vision Screening: Recommended annual ophthalmology exams for early detection of glaucoma and other disorders of the eye. Would you like a referral to an eye doctor? No    Dental Screening: Recommended annual dental exams for proper oral hygiene  Community Resource Referral / Chronic Care Management: CRR required this visit?  No   CCM required this visit?   No  Plan:   I have personally reviewed and noted the following in the patient's chart:   Medical and social history Use of alcohol, tobacco or illicit drugs  Current medications and supplements including opioid prescriptions. Patient is not currently taking opioid prescriptions. Functional ability and status Nutritional status Physical activity Advanced directives List of other physicians Hospitalizations, surgeries, and ER visits in previous 12 months Vitals Screenings to include cognitive, depression, and falls Referrals and appointments  In addition, I have reviewed and discussed with patient certain preventive protocols, quality metrics, and best practice recommendations. A written personalized care plan for preventive services as well as general preventive health recommendations were provided to patient.   Lashe Oliveira, CMA   10/15/2024   After Visit Summary: (Mail) Due to this being a telephonic visit, the after visit summary with patients personalized plan was offered to patient via mail   Notes: Nothing significant to report at this time.

## 2024-10-28 ENCOUNTER — Ambulatory Visit: Payer: Self-pay | Admitting: Internal Medicine

## 2024-10-28 ENCOUNTER — Ambulatory Visit (HOSPITAL_COMMUNITY)
Admission: RE | Admit: 2024-10-28 | Discharge: 2024-10-28 | Disposition: A | Discharge status: Home / Self Care | Source: Ambulatory Visit | Attending: Internal Medicine | Admitting: Internal Medicine

## 2024-10-28 DIAGNOSIS — Z780 Asymptomatic menopausal state: Secondary | ICD-10-CM | POA: Diagnosis not present

## 2024-11-03 ENCOUNTER — Ambulatory Visit: Attending: Cardiology | Admitting: Cardiology

## 2024-11-03 ENCOUNTER — Encounter: Payer: Self-pay | Admitting: Cardiology

## 2024-11-03 VITALS — BP 118/68 | HR 84 | Ht 64.0 in | Wt 173.0 lb

## 2024-11-03 DIAGNOSIS — E782 Mixed hyperlipidemia: Secondary | ICD-10-CM

## 2024-11-03 DIAGNOSIS — I5032 Chronic diastolic (congestive) heart failure: Secondary | ICD-10-CM

## 2024-11-03 DIAGNOSIS — I10 Essential (primary) hypertension: Secondary | ICD-10-CM

## 2024-11-03 MED ORDER — DAPAGLIFLOZIN PROPANEDIOL 10 MG PO TABS: 10.0000 mg | ORAL_TABLET | Freq: Every day | ORAL | 3 refills | Status: AC

## 2024-11-03 NOTE — Patient Instructions (Signed)

## 2024-11-03 NOTE — Progress Notes (Signed)
 Clinical Summary Kim [name] is a 83 y.o.female seen today as a new consult, referred by NP Zarwolo for the following medical problems.    1.Chronic HFpEF - admit 06/2024 with volume overload - 06/2024 LVEF 55-60%, indet diastolic function, normal RV, mild MR, normal LA - responded well to IV lasix    - denies any edema. Occasoinal SOB that is better with inhaler. No orthopnea - home weights 173 lbs and stable - ran out of farxiga , compliant with lasix  40mg  daily. Recent labs with rheum showed normal kidney function and K    2. HTN - compliant withmeds   3. HLD - Jan 2025 TC 194 TG 87 HDL 59 LDL 119  - upcoming labs with pcp.   Past Medical History:  Diagnosis Date   Acid reflux    Arthritis    Back spasm    Carpal tunnel syndrome    Hypertension    Rheumatoid arthritis (HCC)      Allergies  Allergen Reactions   Tramadol Hives and Nausea And Vomiting     Current Outpatient Medications  Medication Sig Dispense Refill   acetaminophen  (TYLENOL  8 HOUR ARTHRITIS PAIN) 650 MG CR tablet Take 1 tablet (650 mg total) by mouth every 8 (eight) hours as needed for pain. (Patient taking differently: Take 650 mg by mouth every morning.) 60 tablet 5   albuterol  (VENTOLIN  HFA) 108 (90 Base) MCG/ACT inhaler Inhale 2 puffs into the lungs every 6 (six) hours as needed. 8 g 0   azelaic acid  (AZELEX ) 20 % cream Apply topically 2 (two) times daily. After skin is thoroughly washed and patted dry, gently but thoroughly massage a thin film of azelaic acid  cream into the affected area twice daily, in the morning and evening. 30 g 1   Blood Pressure Monitoring (BLOOD PRESSURE KIT) DEVI Take blood pressure reading once daily. 1 each 0   Cholecalciferol (VITAMIN D3) 50 MCG (2000 UT) capsule Take 2,000 Units by mouth daily.     dapagliflozin  propanediol (FARXIGA ) 10 MG TABS tablet Take 1 tablet (10 mg total) by mouth daily before breakfast. 30 tablet 2   furosemide  (LASIX ) 40 MG tablet Take 1  tablet (40 mg total) by mouth daily. 30 tablet 3   losartan  (COZAAR ) 25 MG tablet Take 1 tablet (25 mg total) by mouth daily. 30 tablet 3   metFORMIN  (GLUCOPHAGE ) 500 MG tablet Take 0.5 tablets (250 mg total) by mouth daily with breakfast.     rosuvastatin  (CRESTOR ) 10 MG tablet Take 1 tablet (10 mg total) by mouth at bedtime. 100 tablet 3   Spacer/Aero-Holding Chambers (AEROCHAMBER PLUS) inhaler Use with inhaler 1 each 2   SYMBICORT  80-4.5 MCG/ACT inhaler Inhale 2 puffs into the lungs 2 (two) times daily. (Patient not taking: Reported on 10/15/2024) 1 each 3   No current facility-administered medications for this visit.     Past Surgical History:  Procedure Laterality Date   KNEE SURGERY     ORIF TOE FRACTURE Left 02/27/2021   Procedure: OPEN REDUCTION INTERNAL FIXATION (ORIF) METATARSAL (TOE) FRACTURE;  Surgeon: Onesimo Oneil LABOR, MD;  Location: [facility];  Service: Orthopedics;  Laterality: Left;  ORIF of 1st and 2nd metatarsal fractures   TUBAL LIGATION       Allergies  Allergen Reactions   Tramadol Hives and Nausea And Vomiting      Family History  Problem Relation Age of Onset   Heart failure Mother    Stroke Mother    Heart  failure Father    Stroke Father    Hypertension Sister      Social History Ms. Merida reports that she has never smoked. She has never been exposed to tobacco smoke. She has never used smokeless tobacco. Ms. Dazey reports no history of alcohol use.     Physical Examination Today's Vitals   11/03/24 1520  BP: 118/68  Pulse: 84  SpO2: 98%  Weight: 173 lb (78.5 kg)  Height: 5' 4 (1.626 m)   Body mass index is 29.7 kg/m.  Gen: resting comfortably, no acute distress HEENT: no scleral icterus, pupils equal round and reactive, no palptable cervical adenopathy,  CV: RRR, no mrg, no jvd Resp: Clear to auscultation bilaterally GI: abdomen is soft, non-tender, non-distended, normal bowel sounds, no hepatosplenomegaly MSK: extremities are warm, no  edema.  Skin: warm, no rash Neuro:  no focal deficits Psych: appropriate affect     Assessment and Plan  1.Chronic HFpEF - euvolemic today without symptoms - will refill her farxiga , continue her lasix  40mg  daily  2. HTN - at goal, continue current meds  3. HLD - f/u upcoming labs with pcp      Dorn PHEBE Ross, M.D

## 2024-11-09 ENCOUNTER — Encounter: Payer: Self-pay | Admitting: Family Medicine

## 2024-11-09 ENCOUNTER — Ambulatory Visit: Admitting: Family Medicine

## 2024-11-09 VITALS — BP 123/76 | HR 81 | Ht 64.0 in | Wt 171.0 lb

## 2024-11-09 DIAGNOSIS — E038 Other specified hypothyroidism: Secondary | ICD-10-CM

## 2024-11-09 DIAGNOSIS — E559 Vitamin D deficiency, unspecified: Secondary | ICD-10-CM

## 2024-11-09 DIAGNOSIS — I10 Essential (primary) hypertension: Secondary | ICD-10-CM

## 2024-11-09 DIAGNOSIS — R0602 Shortness of breath: Secondary | ICD-10-CM | POA: Diagnosis not present

## 2024-11-09 DIAGNOSIS — Z7984 Long term (current) use of oral hypoglycemic drugs: Secondary | ICD-10-CM

## 2024-11-09 DIAGNOSIS — E782 Mixed hyperlipidemia: Secondary | ICD-10-CM | POA: Diagnosis not present

## 2024-11-09 DIAGNOSIS — R7301 Impaired fasting glucose: Secondary | ICD-10-CM

## 2024-11-09 DIAGNOSIS — E1100 Type 2 diabetes mellitus with hyperosmolarity without nonketotic hyperglycemic-hyperosmolar coma (NKHHC): Secondary | ICD-10-CM

## 2024-11-09 MED ORDER — ALBUTEROL SULFATE HFA 108 (90 BASE) MCG/ACT IN AERS: 2.0000 | INHALATION_SPRAY | Freq: Four times a day (QID) | RESPIRATORY_TRACT | 0 refills | Status: DC | PRN

## 2024-11-09 NOTE — Assessment & Plan Note (Signed)
 Stable Low sodium diet with increased physical activity encouraged Encouraged to continue treatment regimen as is BP Readings from Last 3 Encounters:  11/09/24 123/76  11/03/24 118/68  08/07/24 105/68

## 2024-11-09 NOTE — Progress Notes (Signed)
 Established Patient Office Visit  Subjective:  Patient ID: Kim [name], female    DOB: [birth year]  Age: 83 y.o. MRN: [id number]  CC:  Chief Complaint  Patient presents with   Medical Management of Chronic Issues    Four month follow up    HPI Kim [name] is a 83 y.o. female with past medical history of HTN, T2DM, Low back pain presents for f/u of  chronic medical conditions.  For the details of today's visit, please refer to the assessment and plan.     Past Medical History:  Diagnosis Date   Acid reflux    Arthritis    Back spasm    Carpal tunnel syndrome    Hypertension    Rheumatoid arthritis [facility])     Past Surgical History:  Procedure Laterality Date   KNEE SURGERY     ORIF TOE FRACTURE Left 02/27/2021   Procedure: OPEN REDUCTION INTERNAL FIXATION (ORIF) METATARSAL (TOE) FRACTURE;  Surgeon: Onesimo Oneil LABOR, MD;  Location: [facility];  Service: Orthopedics;  Laterality: Left;  ORIF of 1st and 2nd metatarsal fractures   TUBAL LIGATION      Family History  Problem Relation Age of Onset   Heart failure Mother    Stroke Mother    Heart failure Father    Stroke Father    Hypertension Sister     Social History   Socioeconomic History   Marital status: Divorced    Spouse name: Not on file   Number of children: 8   Years of education: Not on file   Highest education level: Not on file  Occupational History   Not on file  Tobacco Use   Smoking status: Never    Passive exposure: Never   Smokeless tobacco: Never  Vaping Use   Vaping status: Never Used  Substance and Sexual Activity   Alcohol use: No   Drug use: No   Sexual activity: Not Currently    Birth control/protection: Surgical    Comment: tubal  Other Topics Concern   Not on file  Social History Narrative   Not on file   Social Drivers of Health   Financial Resource Strain: Low Risk  (10/15/2024)   Overall Financial Resource Strain (CARDIA)    Difficulty of Paying Living Expenses: Not hard at  all  Food Insecurity: No Food Insecurity (10/15/2024)   Hunger Vital Sign    Worried About Running Out of Food in the Last Year: Never true    Ran Out of Food in the Last Year: Never true  Transportation Needs: No Transportation Needs (10/15/2024)   PRAPARE - Administrator, Civil Service (Medical): No    Lack of Transportation (Non-Medical): No  Physical Activity: Inactive (10/15/2024)   Exercise Vital Sign    Days of Exercise per Week: 0 days    Minutes of Exercise per Session: 0 min  Stress: No Stress Concern Present (10/15/2024)   [employer] of Occupational Health - Occupational Stress Questionnaire    Feeling of Stress: Not at all  Social Connections: Moderately Integrated (10/15/2024)   Social Connection and Isolation Panel    Frequency of Communication with Friends and Family: More than three times a week    Frequency of Social Gatherings with Friends and Family: More than three times a week    Attends Religious Services: More than 4 times per year    Active Member of [employer] or Organizations: Yes    Attends Banker Meetings: More than  4 times per year    Marital Status: Widowed  Intimate Partner Violence: Not At Risk (10/15/2024)   Humiliation, Afraid, Rape, and Kick questionnaire    Fear of Current or Ex-Partner: No    Emotionally Abused: No    Physically Abused: No    Sexually Abused: No    Outpatient Medications Prior to Visit  Medication Sig Dispense Refill   acetaminophen  (TYLENOL  8 HOUR ARTHRITIS PAIN) 650 MG CR tablet Take 1 tablet (650 mg total) by mouth every 8 (eight) hours as needed for pain. 60 tablet 5   azelaic acid  (AZELEX ) 20 % cream Apply topically 2 (two) times daily. After skin is thoroughly washed and patted dry, gently but thoroughly massage a thin film of azelaic acid  cream into the affected area twice daily, in the morning and evening. 30 g 1   Blood Pressure Monitoring (BLOOD PRESSURE KIT) DEVI Take blood pressure  reading once daily. 1 each 0   Cholecalciferol (VITAMIN D3) 50 MCG (2000 UT) capsule Take 2,000 Units by mouth daily. (Patient not taking: Reported on 11/03/2024)     dapagliflozin  propanediol (FARXIGA ) 10 MG TABS tablet Take 1 tablet (10 mg total) by mouth daily before breakfast. 90 tablet 3   furosemide  (LASIX ) 40 MG tablet Take 1 tablet (40 mg total) by mouth daily. 30 tablet 3   losartan  (COZAAR ) 25 MG tablet Take 1 tablet (25 mg total) by mouth daily. 30 tablet 3   metFORMIN  (GLUCOPHAGE ) 500 MG tablet Take 0.5 tablets (250 mg total) by mouth daily with breakfast.     methotrexate (RHEUMATREX) 2.5 MG tablet 4 tablets Orally once weekly; Duration: 28 days     ondansetron  (ZOFRAN ) 4 MG tablet Take 4 mg by mouth every 8 (eight) hours as needed.     rosuvastatin  (CRESTOR ) 10 MG tablet Take 1 tablet (10 mg total) by mouth at bedtime. 100 tablet 3   Spacer/Aero-Holding Chambers (AEROCHAMBER PLUS) inhaler Use with inhaler 1 each 2   SYMBICORT  80-4.5 MCG/ACT inhaler Inhale 2 puffs into the lungs 2 (two) times daily. (Patient not taking: Reported on 11/03/2024) 1 each 3   albuterol  (VENTOLIN  HFA) 108 (90 Base) MCG/ACT inhaler Inhale 2 puffs into the lungs every 6 (six) hours as needed. 8 g 0   No facility-administered medications prior to visit.    Allergies  Allergen Reactions   Tramadol Hives and Nausea And Vomiting    ROS Review of Systems  Constitutional:  Negative for chills and fever.  Eyes:  Negative for visual disturbance.  Respiratory:  Negative for chest tightness and shortness of breath.   Neurological:  Negative for dizziness and headaches.      Objective:    Physical Exam HENT:     Head: Normocephalic.     Mouth/Throat:     Mouth: Mucous membranes are moist.  Cardiovascular:     Rate and Rhythm: Normal rate.     Heart sounds: Normal heart sounds.  Pulmonary:     Effort: Pulmonary effort is normal.     Breath sounds: Normal breath sounds.  Neurological:     Mental  Status: She is alert.     BP 123/76   Pulse 81   Ht 5' 4 (1.626 m)   Wt 171 lb (77.6 kg)   SpO2 93%   BMI 29.35 kg/m  Wt Readings from Last 3 Encounters:  11/09/24 171 lb (77.6 kg)  11/03/24 173 lb (78.5 kg)  10/15/24 168 lb (76.2 kg)    Lab Results  Component Value Date   TSH 1.230 09/06/2023   Lab Results  Component Value Date   WBC 5.5 08/07/2024   HGB 14.5 08/07/2024   HCT 45.8 08/07/2024   MCV 92 08/07/2024   PLT 273 08/07/2024   Lab Results  Component Value Date   NA 139 08/07/2024   K 4.1 08/07/2024   CO2 27 08/07/2024   GLUCOSE 90 08/07/2024   BUN 24 08/07/2024   CREATININE 0.92 08/07/2024   BILITOT 0.6 07/28/2024   ALKPHOS 56 07/28/2024   AST 21 07/28/2024   ALT 10 07/28/2024   PROT 7.6 07/28/2024   ALBUMIN 3.6 07/28/2024   CALCIUM  9.4 08/07/2024   ANIONGAP 10 07/30/2024   EGFR 62 08/07/2024   Lab Results  Component Value Date   CHOL 194 01/29/2024   Lab Results  Component Value Date   HDL 59 01/29/2024   Lab Results  Component Value Date   LDLCALC 119 (H) 01/29/2024   Lab Results  Component Value Date   TRIG 87 01/29/2024   Lab Results  Component Value Date   CHOLHDL 3.3 01/29/2024   Lab Results  Component Value Date   HGBA1C 6.3 (H) 01/29/2024      Assessment & Plan:  Primary hypertension Assessment & Plan: Stable Low sodium diet with increased physical activity encouraged Encouraged to continue treatment regimen as is BP Readings from Last 3 Encounters:  11/09/24 123/76  11/03/24 118/68  08/07/24 105/68      Type 2 diabetes mellitus with hyperosmolarity without coma, without long-term current use of insulin  (HCC) Assessment & Plan: No complains voiced Encouraged to continue treatment regimen as is    Mixed hyperlipidemia -     Lipid panel -     CMP14+EGFR -     CBC with Differential/Platelet  SOB (shortness of breath) -     Albuterol  Sulfate HFA; Inhale 2 puffs into the lungs every 6 (six) hours as needed.   Dispense: 8 g; Refill: 0  IFG (impaired fasting glucose) -     Hemoglobin A1c  Vitamin D  deficiency -     VITAMIN D  25 Hydroxy (Vit-D Deficiency, Fractures)  TSH (thyroid -stimulating hormone deficiency) -     TSH + free T4  Note: This chart has been completed using Engineer, Civil (consulting) software, and while attempts have been made to ensure accuracy, certain words and phrases may not be transcribed as intended.    Follow-up: Return in about 4 months (around 03/09/2025).   Maritza Hosterman  Z Bacchus, FNP

## 2024-11-09 NOTE — Assessment & Plan Note (Signed)
 No complains voiced Encouraged to continue treatment regimen as is

## 2024-11-09 NOTE — Patient Instructions (Addendum)
 I appreciate the opportunity to provide care to you today!    Follow up:  4 months  Labs: please stop by the lab during the week to get your blood drawn (CBC, CMP, TSH, Lipid profile, HgA1c, Vit D)  For a Healthier YOU, I Recommend: Reducing your intake of sugar, sodium, carbohydrates, and saturated fats. Increasing your fiber intake by incorporating more whole grains, fruits, and vegetables into your meals. Setting healthy goals with a focus on lowering your consumption of carbs, sugar, and unhealthy fats. Adding variety to your diet by including a wide range of fruits and vegetables. Cutting back on soda and limiting processed foods as much as possible. Staying active: In addition to taking your weight loss medication, aim for at least 150 minutes of moderate-intensity physical activity each week for optimal results.   Please follow up if your symptoms worsen or fail to improve.   Please continue to a heart-healthy diet and increase your physical activities. Try to exercise for at least five days a week.    It was a pleasure to see you and I look forward to continuing to work together on your health and well-being. Please do not hesitate to call the office if you need care or have questions about your care.  In case of emergency, please visit the Emergency Department for urgent care, or contact our clinic at [phone] to schedule an appointment. We're here to help you!   Have a wonderful day and week. With Gratitude, Meade JENEANE Gerlach MSN, FNP-BC, PMHNP-BC

## 2024-11-17 ENCOUNTER — Telehealth: Payer: Self-pay

## 2024-11-17 NOTE — Telephone Encounter (Signed)
 Called patient no answer on home and cell number wrong #.  Patient seen today at other office

## 2024-11-17 NOTE — Telephone Encounter (Signed)
 Copied from CRM #[id number]. Topic: Appointments - Appointment Scheduling >> Nov 17, 2024 12:38 PM Kim [name] wrote:  Patient is wanting an acute appt for cold symptoms in the morning time but the next appt is on 11/20 in the after noon, and after that it goes until December. Not sure if there was anything that could be done but said id send a message

## 2024-11-18 ENCOUNTER — Ambulatory Visit
Admission: RE | Admit: 2024-11-18 | Discharge: 2024-11-18 | Disposition: A | Discharge status: Home / Self Care | Attending: Family Medicine | Admitting: Family Medicine

## 2024-11-18 ENCOUNTER — Ambulatory Visit (INDEPENDENT_AMBULATORY_CARE_PROVIDER_SITE_OTHER)

## 2024-11-18 VITALS — BP 136/75 | HR 98 | Temp 98.2°F

## 2024-11-18 DIAGNOSIS — J22 Unspecified acute lower respiratory infection: Secondary | ICD-10-CM

## 2024-11-18 DIAGNOSIS — R051 Acute cough: Secondary | ICD-10-CM | POA: Diagnosis not present

## 2024-11-18 DIAGNOSIS — J4521 Mild intermittent asthma with (acute) exacerbation: Secondary | ICD-10-CM

## 2024-11-18 DIAGNOSIS — R0602 Shortness of breath: Secondary | ICD-10-CM

## 2024-11-18 MED ORDER — AZELASTINE HCL 0.1 % NA SOLN: 1.0000 | Freq: Two times a day (BID) | NASAL | 0 refills | Status: AC

## 2024-11-18 MED ORDER — BENZONATATE 200 MG PO CAPS: 200.0000 mg | ORAL_CAPSULE | Freq: Three times a day (TID) | ORAL | 0 refills | Status: AC | PRN

## 2024-11-18 MED ORDER — AZITHROMYCIN 250 MG PO TABS
ORAL_TABLET | ORAL | 0 refills | Status: AC
Start: 2024-11-18 — End: ?

## 2024-11-18 MED ORDER — ALBUTEROL SULFATE HFA 108 (90 BASE) MCG/ACT IN AERS: 2.0000 | INHALATION_SPRAY | RESPIRATORY_TRACT | 0 refills | Status: AC | PRN

## 2024-11-18 NOTE — ED Provider Notes (Signed)
 [facility]    CSN: [id number] Arrival date & time: 11/18/24  1002      History   Chief Complaint Chief Complaint  Patient presents with   Cough    Cough, headache, body aches, conjustion - Entered by patient    HPI Kim [name] is a 83 y.o. female.   Patient presenting today with 5-day history of congestion, cough, headaches, body aches.  Denies fever, chills, chest pain, shortness of breath, abdominal pain, vomiting, diarrhea.  States cough has worsened significantly over the past few days and has been using her inhaler for asthma with minimal relief.  She otherwise is also trying over-the-counter cold and congestion medications and Tylenol  with minimal relief.    Past Medical History:  Diagnosis Date   Acid reflux    Arthritis    Back spasm    Carpal tunnel syndrome    Hypertension    Rheumatoid arthritis [facility])     Patient Active Problem List   Diagnosis Date Noted   Peripheral edema 08/07/2024   Acute on chronic diastolic CHF (congestive heart failure) (HCC) 07/30/2024   Acute exacerbation of CHF (congestive heart failure) (HCC) 07/29/2024   Acute congestive heart failure (HCC) 07/28/2024   Rheumatoid arthritis (HCC) 07/28/2024   Hypokalemia 07/28/2024   Abdominal pain 02/19/2024   Hyperpigmentation of skin 02/19/2024   Type 2 diabetes mellitus (HCC) 01/16/2024   Cough 09/23/2023   Hypertension 08/30/2023   Knee pain 08/30/2023   Clavi 09/20/2021   Uterovaginal prolapse, incomplete 08/06/2013   LOW BACK PAIN 01/09/2010   Backache 01/09/2010   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   Back pain, chronic 01/09/2010    Past Surgical History:  Procedure Laterality Date   KNEE SURGERY     ORIF TOE FRACTURE Left 02/27/2021   Procedure: OPEN REDUCTION INTERNAL FIXATION (ORIF) METATARSAL (TOE) FRACTURE;  Surgeon: Onesimo Oneil LABOR, MD;  Location: [facility];  Service: Orthopedics;  Laterality: Left;  ORIF of 1st and 2nd metatarsal  fractures   TUBAL LIGATION      OB History     Gravida  8   Para  8   Term  8   Preterm      AB      Living  8      SAB      IAB      Ectopic      Multiple      Live Births  8            Home Medications    Prior to Admission medications   Medication Sig Start Date End Date Taking? Authorizing Provider  azelastine (ASTELIN) 0.1 % nasal spray Place 1 spray into both nostrils 2 (two) times daily. Use in each nostril as directed 11/18/24  Yes Stuart Vernell Norris, PA-C  azithromycin (ZITHROMAX) 250 MG tablet Take first 2 tablets together, then 1 every day until finished. 11/18/24  Yes Stuart Vernell Norris, PA-C  benzonatate  (TESSALON ) 200 MG capsule Take 1 capsule (200 mg total) by mouth 3 (three) times daily as needed for cough. 11/18/24  Yes Stuart Vernell Norris, PA-C  acetaminophen  (TYLENOL  8 HOUR ARTHRITIS PAIN) 650 MG CR tablet Take 1 tablet (650 mg total) by mouth every 8 (eight) hours as needed for pain. 07/09/24   Del Wilhelmena Lloyd Sola, FNP  albuterol  (VENTOLIN  HFA) 108 (90 Base) MCG/ACT inhaler Inhale 2 puffs into the lungs every 4 (four) hours as needed. 11/18/24   Stuart Vernell Norris, PA-C  azelaic acid  (AZELEX ) 20 % cream Apply topically 2 (two) times daily. After skin is thoroughly washed and patted dry, gently but thoroughly massage a thin film of azelaic acid  cream into the affected area twice daily, in the morning and evening. 07/09/24   Del Wilhelmena Lloyd Sola, FNP  Blood Pressure Monitoring (BLOOD PRESSURE KIT) DEVI Take blood pressure reading once daily. 02/19/24   Del Orbe Polanco, Iliana, FNP  Cholecalciferol (VITAMIN D3) 50 MCG (2000 UT) capsule Take 2,000 Units by mouth daily. Patient not taking: Reported on 11/03/2024    [provider]  dapagliflozin  propanediol (FARXIGA ) 10 MG TABS tablet Take 1 tablet (10 mg total) by mouth daily before breakfast. 11/03/24   Alvan Dorn FALCON, MD  furosemide  (LASIX ) 40 MG tablet Take 1 tablet  (40 mg total) by mouth daily. 07/30/24 07/30/25  Ricky Fines, MD  losartan  (COZAAR ) 25 MG tablet Take 1 tablet (25 mg total) by mouth daily. 07/30/24 07/30/25  Ricky Fines, MD  metFORMIN  (GLUCOPHAGE ) 500 MG tablet Take 0.5 tablets (250 mg total) by mouth daily with breakfast. 07/30/24   Ricky Fines, MD  methotrexate (RHEUMATREX) 2.5 MG tablet 4 tablets Orally once weekly; Duration: 28 days 10/01/24   [provider]  ondansetron  (ZOFRAN ) 4 MG tablet Take 4 mg by mouth every 8 (eight) hours as needed. 08/27/24   [provider]  rosuvastatin  (CRESTOR ) 10 MG tablet Take 1 tablet (10 mg total) by mouth at bedtime. 09/10/24   Del Wilhelmena Lloyd Sola, FNP  Spacer/Aero-Holding Chambers (AEROCHAMBER PLUS) inhaler Use with inhaler 01/30/22   Van Knee, MD    Family History Family History  Problem Relation Age of Onset   Heart failure Mother    Stroke Mother    Heart failure Father    Stroke Father    Hypertension Sister     Social History Social History   Tobacco Use   Smoking status: Never    Passive exposure: Never   Smokeless tobacco: Never  Vaping Use   Vaping status: Never Used  Substance Use Topics   Alcohol use: No   Drug use: No     Allergies   Tramadol   Review of Systems Review of Systems Per HPI  Physical Exam Triage Vital Signs ED Triage Vitals  Encounter Vitals Group     BP 11/18/24 1032 136/75     Girls Systolic BP Percentile --      Girls Diastolic BP Percentile --      Boys Systolic BP Percentile --      Boys Diastolic BP Percentile --      Pulse Rate 11/18/24 1032 98     Resp --      Temp 11/18/24 1032 98.2 F (36.8 C)     Temp Source 11/18/24 1032 Oral     SpO2 11/18/24 1032 94 %     Weight --      Height --      Head Circumference --      Peak Flow --      Pain Score 11/18/24 1033 8     Pain Loc --      Pain Education --      Exclude from Growth Chart --    No data found.  Updated Vital Signs BP 136/75 (BP  Location: Right Arm)   Pulse 98   Temp 98.2 F (36.8 C) (Oral)   SpO2 94%   Visual Acuity Right Eye Distance:   Left Eye Distance:   Bilateral Distance:  Right Eye Near:   Left Eye Near:    Bilateral Near:     Physical Exam Vitals and nursing note reviewed.  Constitutional:      Appearance: Normal appearance.  HENT:     Head: Atraumatic.     Right Ear: Tympanic membrane and external ear normal.     Left Ear: Tympanic membrane and external ear normal.     Nose: Congestion present.     Mouth/Throat:     Mouth: Mucous membranes are moist.     Pharynx: Posterior oropharyngeal erythema present.  Eyes:     Extraocular Movements: Extraocular movements intact.     Conjunctiva/sclera: Conjunctivae normal.  Cardiovascular:     Rate and Rhythm: Normal rate and regular rhythm.     Heart sounds: Normal heart sounds.  Pulmonary:     Effort: Pulmonary effort is normal.     Breath sounds: Wheezing present.  Musculoskeletal:        General: Normal range of motion.     Cervical back: Normal range of motion and neck supple.  Skin:    General: Skin is warm and dry.  Neurological:     Mental Status: She is alert and oriented to person, place, and time.  Psychiatric:        Mood and Affect: Mood normal.        Thought Content: Thought content normal.      UC Treatments / Results  Labs (all labs ordered are listed, but only abnormal results are displayed) Labs Reviewed - No data to display  EKG   Radiology DG Chest 2 View Result Date: 11/18/2024 EXAM: 2 VIEW(S) XRAY OF THE CHEST 11/18/2024 11:02:15 AM COMPARISON: 07/28/2024 CLINICAL HISTORY: cough, wheezing, congestion worsening x 5 days FINDINGS: LUNGS AND PLEURA: Mildly increased diffuse interstitial markings, nonspecific. Larger lung volumes. Some evidence of chronic interstitial lung disease on chest CT last year. Difficult to exclude acute viral / atypical respiratory infection. No pleural effusion. No pneumothorax. HEART  AND MEDIASTINUM: Stable mild tortuosity of the thoracic aorta. No acute abnormality of the cardiac silhouette. BONES AND SOFT TISSUES: Multilevel degenerative changes of thoracic spine. No acute osseous abnormality. IMPRESSION: 1. Mildly increased diffuse interstitial markings; difficult to exclude acute viral / atypical respiratory infection. Electronically signed by: Helayne Hurst MD 11/18/2024 11:31 AM EST RP Workstation: [id number]    Procedures Procedures (including critical care time)  Medications Ordered in UC Medications - No data to display  Initial Impression / Assessment and Plan / UC Course  I have reviewed the triage vital signs and the nursing notes.  Pertinent labs & imaging results that were available during my care of the patient were reviewed by me and considered in my medical decision making (see chart for details).     Vital signs within normal limits, she is well-appearing and in no acute distress.  Chest x-ray showing viral versus atypical respiratory infection so we will trial Zithromax, albuterol , Tessalon , Astelin, supportive over-the-counter medications and home care.  Return for worsening or unresolving symptoms.  Final Clinical Impressions(s) / UC Diagnoses   Final diagnoses:  Acute cough  Lower respiratory infection  Mild intermittent asthma with acute exacerbation     Discharge Instructions      I have prescribed an antibiotic, nasal spray and cough medicine as well as refilled your albuterol  inhaler to treat your current symptoms.  As we discussed, your chest x-ray is showing viral versus atypical bacterial infection in your lungs so we are covering both bases  with our treatment plan today.  Stay well-hydrated, take deep breaths and follow-up for worsening or unresolving symptoms    ED Prescriptions     Medication Sig Dispense Auth. Provider   albuterol  (VENTOLIN  HFA) 108 (90 Base) MCG/ACT inhaler Inhale 2 puffs into the lungs every 4 (four) hours as  needed. 18 g Stuart Vernell Norris, PA-C   azithromycin (ZITHROMAX) 250 MG tablet Take first 2 tablets together, then 1 every day until finished. 6 tablet Stuart Vernell Norris, PA-C   benzonatate  (TESSALON ) 200 MG capsule Take 1 capsule (200 mg total) by mouth 3 (three) times daily as needed for cough. 20 capsule Stuart Vernell Norris, PA-C   azelastine (ASTELIN) 0.1 % nasal spray Place 1 spray into both nostrils 2 (two) times daily. Use in each nostril as directed 30 mL Stuart Vernell Norris, PA-C      PDMP not reviewed this encounter.   Stuart Vernell Norris, PA-C 11/18/24 [phone]

## 2024-11-18 NOTE — Discharge Instructions (Signed)
 I have prescribed an antibiotic, nasal spray and cough medicine as well as refilled your albuterol  inhaler to treat your current symptoms.  As we discussed, your chest x-ray is showing viral versus atypical bacterial infection in your lungs so we are covering both bases with our treatment plan today.  Stay well-hydrated, take deep breaths and follow-up for worsening or unresolving symptoms

## 2024-11-18 NOTE — ED Triage Notes (Addendum)
 Pt reports cough headache and body aches x 5 days, denies any fever. Pt states he has pain in her upper back near her lungs has been suing her inhaler, OTC cough meds, tylenol  arthritis. Has found no relief.

## 2024-11-19 ENCOUNTER — Ambulatory Visit: Admitting: Internal Medicine

## 2024-12-01 ENCOUNTER — Other Ambulatory Visit: Payer: Self-pay | Admitting: Family Medicine

## 2024-12-01 NOTE — Telephone Encounter (Unsigned)
 Copied from CRM #[id number]. Topic: Clinical - Medication Refill >> Dec 01, 2024  9:37 AM Nathanel BROCKS wrote: Medication: losartan  (COZAAR ) 25 MG tablet furosemide  (LASIX ) 40 MG tablet  Has the patient contacted their pharmacy? Yes   This is the patient's preferred pharmacy:  [facility] - [place], [place] - [address] [place] [address] Phone: [phone] Fax: [phone]  Is this the correct pharmacy for this prescription? Yes If no, delete pharmacy and type the correct one.   Has the prescription been filled recently? Yes  Is the patient out of the medication? Yes  Has the patient been seen for an appointment in the last year OR does the patient have an upcoming appointment? Yes  Can we respond through MyChart? No  Agent: Please be advised that Rx refills may take up to 3 business days. We ask that you follow-up with your pharmacy.

## 2024-12-02 ENCOUNTER — Telehealth: Payer: Self-pay | Admitting: Family Medicine

## 2024-12-02 MED ORDER — FUROSEMIDE 40 MG PO TABS: 40.0000 mg | ORAL_TABLET | Freq: Every day | ORAL | 3 refills | Status: AC

## 2024-12-02 MED ORDER — LOSARTAN POTASSIUM 25 MG PO TABS: 25.0000 mg | ORAL_TABLET | Freq: Every day | ORAL | 3 refills | Status: AC

## 2024-12-02 NOTE — Telephone Encounter (Signed)
 Prescription Request  12/02/2024  LOV: 07/09/2024  What is the name of the medication or equipment? furosemide  (LASIX ) 40 MG tablet [[id number]]   losartan  (COZAAR ) 25 MG tablet [[id number]]    Have you contacted your pharmacy to request a refill? Yes   Which pharmacy would you like this sent to?  [facility] - [place], [place] - [address] [place] [address] Phone: [phone] Fax: [phone]    Patient notified that their request is being sent to the clinical staff for review and that they should receive a response within 2 business days.   Please advise at walked into office

## 2024-12-02 NOTE — Telephone Encounter (Signed)
 Both meds were filled this am

## 2024-12-08 ENCOUNTER — Ambulatory Visit

## 2025-03-09 ENCOUNTER — Ambulatory Visit: Admitting: Family Medicine

## 2025-10-20 ENCOUNTER — Ambulatory Visit
